# Patient Record
Sex: Female | Born: 1945 | Race: White | Hispanic: No | Marital: Married | Smoking: Never smoker
Health system: Southern US, Community
[De-identification: ages and names within clinical notes are randomized; demographics above are authoritative.]

## PROBLEM LIST (undated history)

## (undated) DIAGNOSIS — R748 Abnormal levels of other serum enzymes: Secondary | ICD-10-CM

## (undated) DIAGNOSIS — Z5189 Encounter for other specified aftercare: Secondary | ICD-10-CM

## (undated) DIAGNOSIS — Z87442 Personal history of urinary calculi: Secondary | ICD-10-CM

## (undated) DIAGNOSIS — E785 Hyperlipidemia, unspecified: Secondary | ICD-10-CM

## (undated) DIAGNOSIS — E559 Vitamin D deficiency, unspecified: Secondary | ICD-10-CM

## (undated) DIAGNOSIS — I779 Disorder of arteries and arterioles, unspecified: Secondary | ICD-10-CM

## (undated) DIAGNOSIS — K429 Umbilical hernia without obstruction or gangrene: Secondary | ICD-10-CM

## (undated) DIAGNOSIS — H269 Unspecified cataract: Secondary | ICD-10-CM

## (undated) DIAGNOSIS — Z8759 Personal history of other complications of pregnancy, childbirth and the puerperium: Secondary | ICD-10-CM

## (undated) DIAGNOSIS — I482 Chronic atrial fibrillation, unspecified: Secondary | ICD-10-CM

## (undated) DIAGNOSIS — Z90710 Acquired absence of both cervix and uterus: Secondary | ICD-10-CM

## (undated) DIAGNOSIS — G4733 Obstructive sleep apnea (adult) (pediatric): Secondary | ICD-10-CM

## (undated) DIAGNOSIS — D649 Anemia, unspecified: Secondary | ICD-10-CM

## (undated) DIAGNOSIS — E739 Lactose intolerance, unspecified: Secondary | ICD-10-CM

## (undated) DIAGNOSIS — M7122 Synovial cyst of popliteal space [Baker], left knee: Secondary | ICD-10-CM

## (undated) DIAGNOSIS — B351 Tinea unguium: Secondary | ICD-10-CM

## (undated) DIAGNOSIS — M859 Disorder of bone density and structure, unspecified: Secondary | ICD-10-CM

## (undated) DIAGNOSIS — S42309A Unspecified fracture of shaft of humerus, unspecified arm, initial encounter for closed fracture: Secondary | ICD-10-CM

## (undated) DIAGNOSIS — N6002 Solitary cyst of left breast: Secondary | ICD-10-CM

## (undated) DIAGNOSIS — Z8781 Personal history of (healed) traumatic fracture: Secondary | ICD-10-CM

## (undated) DIAGNOSIS — G4730 Sleep apnea, unspecified: Secondary | ICD-10-CM

## (undated) DIAGNOSIS — H9193 Unspecified hearing loss, bilateral: Secondary | ICD-10-CM

## (undated) DIAGNOSIS — T7840XA Allergy, unspecified, initial encounter: Secondary | ICD-10-CM

## (undated) DIAGNOSIS — I679 Cerebrovascular disease, unspecified: Secondary | ICD-10-CM

## (undated) DIAGNOSIS — L570 Actinic keratosis: Secondary | ICD-10-CM

## (undated) DIAGNOSIS — J439 Emphysema, unspecified: Secondary | ICD-10-CM

## (undated) DIAGNOSIS — M7660 Achilles tendinitis, unspecified leg: Secondary | ICD-10-CM

## (undated) DIAGNOSIS — M1990 Unspecified osteoarthritis, unspecified site: Secondary | ICD-10-CM

## (undated) DIAGNOSIS — I10 Essential (primary) hypertension: Secondary | ICD-10-CM

## (undated) DIAGNOSIS — I739 Peripheral vascular disease, unspecified: Secondary | ICD-10-CM

## (undated) DIAGNOSIS — M8580 Other specified disorders of bone density and structure, unspecified site: Secondary | ICD-10-CM

## (undated) DIAGNOSIS — T466X5A Adverse effect of antihyperlipidemic and antiarteriosclerotic drugs, initial encounter: Secondary | ICD-10-CM

## (undated) DIAGNOSIS — J45909 Unspecified asthma, uncomplicated: Secondary | ICD-10-CM

## (undated) DIAGNOSIS — I4891 Unspecified atrial fibrillation: Secondary | ICD-10-CM

## (undated) DIAGNOSIS — H903 Sensorineural hearing loss, bilateral: Secondary | ICD-10-CM

## (undated) DIAGNOSIS — M791 Myalgia, unspecified site: Secondary | ICD-10-CM

## (undated) DIAGNOSIS — Z87828 Personal history of other (healed) physical injury and trauma: Secondary | ICD-10-CM

## (undated) DIAGNOSIS — E042 Nontoxic multinodular goiter: Secondary | ICD-10-CM

## (undated) DIAGNOSIS — N200 Calculus of kidney: Secondary | ICD-10-CM

## (undated) DIAGNOSIS — N281 Cyst of kidney, acquired: Secondary | ICD-10-CM

## (undated) HISTORY — DX: Umbilical hernia without obstruction or gangrene: K42.9

## (undated) HISTORY — DX: Achilles tendinitis, unspecified leg: M76.60

## (undated) HISTORY — DX: Synovial cyst of popliteal space (Baker), left knee: M71.22

## (undated) HISTORY — DX: Unspecified cataract: H26.9

## (undated) HISTORY — DX: Calculus of kidney: N20.0

## (undated) HISTORY — DX: Personal history of other (healed) physical injury and trauma: Z87.828

## (undated) HISTORY — DX: Personal history of (healed) traumatic fracture: Z87.81

## (undated) HISTORY — DX: Personal history of urinary calculi: Z87.442

## (undated) HISTORY — DX: Unspecified atrial fibrillation: I48.91

## (undated) HISTORY — DX: Adverse effect of antihyperlipidemic and antiarteriosclerotic drugs, initial encounter: T46.6X5A

## (undated) HISTORY — DX: Chronic atrial fibrillation, unspecified: I48.20

## (undated) HISTORY — DX: Solitary cyst of left breast: N60.02

## (undated) HISTORY — PX: BREAST CYST ASPIRATION: SHX578

## (undated) HISTORY — DX: Personal history of other complications of pregnancy, childbirth and the puerperium: Z87.442

## (undated) HISTORY — DX: Allergy, unspecified, initial encounter: T78.40XA

## (undated) HISTORY — DX: Other specified disorders of bone density and structure, unspecified site: M85.80

## (undated) HISTORY — DX: Essential (primary) hypertension: I10

## (undated) HISTORY — DX: Sensorineural hearing loss, bilateral: H90.3

## (undated) HISTORY — DX: Hyperlipidemia, unspecified: E78.5

## (undated) HISTORY — DX: Disorder of arteries and arterioles, unspecified: I77.9

## (undated) HISTORY — DX: Actinic keratosis: L57.0

## (undated) HISTORY — DX: Myalgia, unspecified site: M79.10

## (undated) HISTORY — DX: Personal history of other complications of pregnancy, childbirth and the puerperium: Z87.59

## (undated) HISTORY — DX: Tinea unguium: B35.1

## (undated) HISTORY — PX: MOUTH SURGERY: SHX715

## (undated) HISTORY — DX: Lactose intolerance, unspecified: E73.9

## (undated) HISTORY — DX: Hypercalcemia: E83.52

## (undated) HISTORY — DX: Anemia, unspecified: D64.9

## (undated) HISTORY — DX: Vitamin D deficiency, unspecified: E55.9

## (undated) HISTORY — DX: Obstructive sleep apnea (adult) (pediatric): G47.33

## (undated) HISTORY — DX: Sleep apnea, unspecified: G47.30

## (undated) HISTORY — DX: Emphysema, unspecified: J43.9

## (undated) HISTORY — DX: Unspecified osteoarthritis, unspecified site: M19.90

## (undated) HISTORY — DX: Unspecified fracture of shaft of humerus, unspecified arm, initial encounter for closed fracture: S42.309A

## (undated) HISTORY — DX: Acquired absence of both cervix and uterus: Z90.710

## (undated) HISTORY — DX: Unspecified asthma, uncomplicated: J45.909

## (undated) HISTORY — DX: Unspecified hearing loss, bilateral: H91.93

## (undated) HISTORY — DX: Disorder of bone density and structure, unspecified: M85.9

## (undated) HISTORY — DX: Encounter for other specified aftercare: Z51.89

## (undated) HISTORY — DX: Peripheral vascular disease, unspecified: I73.9

## (undated) HISTORY — DX: Abnormal levels of other serum enzymes: R74.8

## (undated) HISTORY — DX: Cerebrovascular disease, unspecified: I67.9

## (undated) HISTORY — DX: Nontoxic multinodular goiter: E04.2

## (undated) HISTORY — DX: Cyst of kidney, acquired: N28.1

---
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
## 1987-01-12 HISTORY — PX: ABDOMINAL HYSTERECTOMY: SHX81

## 1994-01-11 HISTORY — PX: ABLATION: SHX5711

## 2000-01-12 HISTORY — PX: KNEE ARTHROSCOPY W/ MENISCAL REPAIR: SHX1877

## 2008-01-12 LAB — HM COLONOSCOPY

## 2009-12-30 LAB — HM DEXA SCAN: HM Dexa Scan: -1.4

## 2012-10-11 DIAGNOSIS — N281 Cyst of kidney, acquired: Secondary | ICD-10-CM

## 2012-10-11 HISTORY — DX: Cyst of kidney, acquired: N28.1

## 2013-01-11 HISTORY — PX: REPLACEMENT TOTAL KNEE: SUR1224

## 2014-05-24 LAB — HM DEXA SCAN

## 2016-11-30 DIAGNOSIS — I10 Essential (primary) hypertension: Secondary | ICD-10-CM | POA: Insufficient documentation

## 2016-11-30 DIAGNOSIS — G4733 Obstructive sleep apnea (adult) (pediatric): Secondary | ICD-10-CM | POA: Insufficient documentation

## 2016-11-30 DIAGNOSIS — I482 Chronic atrial fibrillation, unspecified: Secondary | ICD-10-CM | POA: Insufficient documentation

## 2016-11-30 DIAGNOSIS — E785 Hyperlipidemia, unspecified: Secondary | ICD-10-CM | POA: Insufficient documentation

## 2017-01-11 HISTORY — PX: COLONOSCOPY: SHX174

## 2017-01-21 DIAGNOSIS — E042 Nontoxic multinodular goiter: Secondary | ICD-10-CM | POA: Insufficient documentation

## 2017-01-21 DIAGNOSIS — M179 Osteoarthritis of knee, unspecified: Secondary | ICD-10-CM | POA: Insufficient documentation

## 2017-01-21 DIAGNOSIS — R7301 Impaired fasting glucose: Secondary | ICD-10-CM | POA: Insufficient documentation

## 2017-01-21 DIAGNOSIS — Z90710 Acquired absence of both cervix and uterus: Secondary | ICD-10-CM | POA: Insufficient documentation

## 2017-01-21 DIAGNOSIS — L570 Actinic keratosis: Secondary | ICD-10-CM | POA: Insufficient documentation

## 2017-01-21 DIAGNOSIS — E559 Vitamin D deficiency, unspecified: Secondary | ICD-10-CM | POA: Insufficient documentation

## 2017-01-21 DIAGNOSIS — E739 Lactose intolerance, unspecified: Secondary | ICD-10-CM | POA: Insufficient documentation

## 2017-01-21 DIAGNOSIS — D649 Anemia, unspecified: Secondary | ICD-10-CM | POA: Insufficient documentation

## 2017-01-21 DIAGNOSIS — K429 Umbilical hernia without obstruction or gangrene: Secondary | ICD-10-CM | POA: Insufficient documentation

## 2017-01-21 DIAGNOSIS — M1710 Unilateral primary osteoarthritis, unspecified knee: Secondary | ICD-10-CM | POA: Insufficient documentation

## 2017-01-21 HISTORY — DX: Nontoxic multinodular goiter: E04.2

## 2017-01-28 DIAGNOSIS — H903 Sensorineural hearing loss, bilateral: Secondary | ICD-10-CM

## 2017-01-28 HISTORY — DX: Sensorineural hearing loss, bilateral: H90.3

## 2017-01-28 LAB — BASIC METABOLIC PANEL
BUN: 16 (ref 4–21)
Creatinine: 0.8 (ref 0.5–1.1)
Glucose: 101
Potassium: 4.6 (ref 3.4–5.3)
Sodium: 143 (ref 137–147)

## 2017-01-28 LAB — LIPID PANEL
Cholesterol: 211 — AB (ref 0–200)
HDL: 97 — AB (ref 35–70)
LDL Cholesterol: 99
Triglycerides: 76 (ref 40–160)

## 2017-01-28 LAB — CBC AND DIFFERENTIAL
HCT: 41 (ref 36–46)
Hemoglobin: 13.2 (ref 12.0–16.0)
Platelets: 214 (ref 150–399)
WBC: 4.1

## 2017-01-28 LAB — TSH: TSH: 1.7 (ref 0.41–5.90)

## 2017-01-28 LAB — VITAMIN D 25 HYDROXY (VIT D DEFICIENCY, FRACTURES): Vit D, 25-Hydroxy: 38.9

## 2017-01-28 LAB — HEMOGLOBIN A1C: Hemoglobin A1C: 5.5

## 2017-02-05 LAB — HM MAMMOGRAPHY

## 2017-02-09 LAB — COLOGUARD: Cologuard: NEGATIVE

## 2017-02-22 DIAGNOSIS — M8580 Other specified disorders of bone density and structure, unspecified site: Secondary | ICD-10-CM | POA: Insufficient documentation

## 2017-02-22 LAB — HM DEXA SCAN

## 2017-06-17 LAB — BASIC METABOLIC PANEL
BUN: 14 (ref 4–21)
Creatinine: 0.9 (ref 0.5–1.1)
Potassium: 4.5 (ref 3.4–5.3)
Sodium: 143 (ref 137–147)

## 2017-06-17 LAB — HEPATIC FUNCTION PANEL
ALT: 7 (ref 7–35)
AST: 14 (ref 13–35)
Alkaline Phosphatase: 80 (ref 25–125)
Bilirubin, Total: 0.4

## 2017-10-28 DIAGNOSIS — Z23 Encounter for immunization: Secondary | ICD-10-CM | POA: Diagnosis not present

## 2017-12-07 ENCOUNTER — Encounter: Payer: Self-pay | Admitting: Nurse Practitioner

## 2017-12-07 ENCOUNTER — Ambulatory Visit (INDEPENDENT_AMBULATORY_CARE_PROVIDER_SITE_OTHER): Payer: Medicare Other | Admitting: Nurse Practitioner

## 2017-12-07 VITALS — BP 142/84 | HR 90 | Temp 98.1°F | Ht 62.0 in | Wt 194.0 lb

## 2017-12-07 DIAGNOSIS — M8949 Other hypertrophic osteoarthropathy, multiple sites: Secondary | ICD-10-CM

## 2017-12-07 DIAGNOSIS — M8580 Other specified disorders of bone density and structure, unspecified site: Secondary | ICD-10-CM

## 2017-12-07 DIAGNOSIS — E042 Nontoxic multinodular goiter: Secondary | ICD-10-CM

## 2017-12-07 DIAGNOSIS — M150 Primary generalized (osteo)arthritis: Secondary | ICD-10-CM | POA: Diagnosis not present

## 2017-12-07 DIAGNOSIS — L600 Ingrowing nail: Secondary | ICD-10-CM | POA: Diagnosis not present

## 2017-12-07 DIAGNOSIS — I10 Essential (primary) hypertension: Secondary | ICD-10-CM | POA: Diagnosis not present

## 2017-12-07 DIAGNOSIS — I482 Chronic atrial fibrillation, unspecified: Secondary | ICD-10-CM

## 2017-12-07 DIAGNOSIS — M159 Polyosteoarthritis, unspecified: Secondary | ICD-10-CM

## 2017-12-07 DIAGNOSIS — B351 Tinea unguium: Secondary | ICD-10-CM | POA: Diagnosis not present

## 2017-12-07 DIAGNOSIS — E785 Hyperlipidemia, unspecified: Secondary | ICD-10-CM | POA: Diagnosis not present

## 2017-12-07 MED ORDER — LISINOPRIL 20 MG PO TABS: 20.0000 mg | ORAL_TABLET | Freq: Every day | ORAL | 1 refills | Status: DC

## 2017-12-07 NOTE — Progress Notes (Signed)
Careteam: Patient Care Team: Barnetta Chapel, MD as PCP - General (Internal Medicine)  Advanced Directive information Does Patient Have a Medical Advance Directive?: No  Allergies  Allergen Reactions  . Coq10 [Coenzyme Q10] Other (See Comments)  . Crestor [Rosuvastatin Calcium] Other (See Comments)    Leg cramps  . Lactose Intolerance (Gi)   . Latex   . Naprosyn [Naproxen]   . Zocor [Simvastatin]     Chief Complaint  Patient presents with  . Establish Care    Pt is being seen to establish care. Previous PCP was in [place].      HPI: Patient is a 72 y.o. female seen in the office today to establish care.   Last physical was in January. 01/24/17  Chronic a fib- taking digoxin 0.125 mg by mouth daily, taking metoprolol succinate 50 mg by mouth twice daily for rate and xarelto 20 mg daily for anticoagulation- due for cardiologist visit. 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 After the ablation was diagnosised with a fib.   Osteopenia- taking cal with Vit D, had bone density last year.  No exercise or weight bearing activity.   Achilles tendonitis/OA- taking tylenol routinely twice daily, OA in ankle, right knee, left shoulder.   Hyperlipidemia- taking crestor 5 mg three times a week because it gives her cramps- tolerates 3 times a week much better than daily Attempts diet modifications.   Lactose intolerance- using probiotic routinely and this has helped significantly with fibercon.  On occuvite due to strong family hx of eye issues, no problems at this time other than severely nearsighted   Nose bleeds- happened when she was moving so started oxymetazoline which has been helping. With allergies, reacted to dust and grass when tested.   Hypertension- taking metoprolol succinate 50 mg BID and lisinopril. - has not taken medication today   Complete right knee replacement in 2015- taking antibiotic prior to dental  work  Non-toxic multinodular goiter- has been going to an endocrinologist who has been monitoring this- was going yearly then every 2 years.   Hysterectomy due to possible ovarian cancer they only removed 1 ovary and there was no cancer detected.   Review of Systems:  Review of Systems  Constitutional: Negative for chills, fever and weight loss.  HENT: Positive for hearing loss. Negative for tinnitus.   Respiratory: Negative for cough, sputum production and shortness of breath.   Cardiovascular: Positive for palpitations. Negative for chest pain and leg swelling.  Gastrointestinal: Negative for abdominal pain, constipation, diarrhea and heartburn.  Genitourinary: Negative for dysuria, frequency and urgency.  Musculoskeletal: Positive for joint pain and myalgias. Negative for back pain and falls.  Skin: Negative.   Neurological: Negative for dizziness and headaches.  Endo/Heme/Allergies: Positive for environmental allergies.  Psychiatric/Behavioral: Negative for depression and memory loss. The patient does not have insomnia.     Past Medical History:  Diagnosis Date  . Achilles tendinitis    Per [facility] New Patient packet   . Cerebrovascular disease    Per [facility] New Patient packet   . Chronic atrial fibrillation    Per [facility] New Patient packet   . Chronic atrial fibrillation    [name], MD  . Disorder of carotid artery [facility])    Lynden Ang, MD  . Dyslipidemia    [name], MD  . H/O calculus of kidney during pregnancy    Lynden Ang, MD  . H/O hysterectomy for benign disease  Lynden Ang, MD  . Hypertension    Per [facility] New Patient packet   . Lactose intolerance    Per [facility] New Patient packet   . Multiple actinic keratoses    Lynden Ang, MD  . 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 Normocytic anemia    Lynden Ang, MD  . Obstructive sleep apnea syndrome    [name], MD  . Osteoarthritis    Per [facility] New Patient packet   .  Osteopenia    Per [facility] New Patient packet   . Renal cyst    Lynden Ang, MD  . 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 Umbilical hernia    Per [facility] New Patient packet   . Vitamin D deficiency    Lynden Ang, MD   Past Surgical History:  Procedure Laterality Date  . ABDOMINAL HYSTERECTOMY  1989   One ovary, Per [facility] New Patient packet   . ABLATION  1996   Radical, Per [facility] New Patient packet   . COLONOSCOPY  2019   Dr.Melissa Fayrene Fearing, Per [facility] New Patient packet   . KNEE ARTHROSCOPY W/ MENISCAL REPAIR  2002   Per [facility] New Patient packet   . REPLACEMENT TOTAL KNEE Left 2015   Per [facility] New Patient packet  . TONSILLECTOMY  1951   Per [facility] New Patient packet    Social History:   reports that she has never smoked. She has never used smokeless tobacco. She reports that she drinks alcohol. She reports that she does not use drugs.  Family History  Problem Relation Age of Onset  . Congestive Heart Failure Mother   . Diabetes Mother   . Arthritis Mother   . Stroke Father   . Dementia Father   . Diabetes Father   . Diabetes Sister   . Cancer Maternal Grandfather   . Congestive Heart Failure Daughter   . Heart attack Maternal Aunt     Medications: Patient's Medications  New Prescriptions   No medications on file  Previous Medications   ACETAMINOPHEN (TYLENOL) 650 MG CR TABLET    Take 1,300 mg by mouth every 8 (eight) hours as needed for pain.   CALCIUM CARBONATE (OSCAL) 1500 (600 CA) MG TABS TABLET    Take 600 mg of elemental calcium by mouth 2 (two) times daily with a meal.   CHOLECALCIFEROL (VITAMIN D3 PO)    Take 1,000 Units by mouth daily.    DIGOXIN (LANOXIN) 0.125 MG TABLET    Take 0.125 mg by mouth daily.   LISINOPRIL (PRINIVIL,ZESTRIL) 20 MG TABLET    Take 20 mg by mouth daily.   METOPROLOL SUCCINATE 50 MG CS24    Take by mouth 2 (two) times daily.   MULTIPLE VITAMINS-MINERALS (OCUVITE PO)    Take by mouth daily.   OXYMETAZOLINE HCL  (SINEX ULTRA FINE MIST 12-HOUR NA)    Place 1 spray into the nose 2 (two) times daily.   POLYCARBOPHIL (FIBERCON) 625 MG TABLET    Take 1,250 mg by mouth daily.   PROBIOTIC PRODUCT (PHILLIPS COLON HEALTH PO)    Take by mouth daily.   RIVAROXABAN (XARELTO) 20 MG TABS TABLET    Take 20 mg by mouth daily with supper.   ROSUVASTATIN (CRESTOR) 5 MG TABLET    Take 5 mg by mouth 3 (three) times a week.  Modified Medications   No medications on file  Discontinued Medications   ACETAMINOPHEN (TYLENOL)  500 MG TABLET    1-2 pills 2 times daily     Physical Exam:  Vitals:   12/07/17 0848  BP: (!) 142/84  Pulse: 90  Temp: 98.1 F (36.7 C)  TempSrc: Oral  SpO2: 97%  Weight: 194 lb (88 kg)  Height: 5\' 2"  (1.575 m)   Body mass index is 35.48 kg/m.  Physical Exam  Constitutional: She is oriented to person, place, and time. She appears well-developed and well-nourished. No distress.  HENT:  Head: Normocephalic and atraumatic.  Mouth/Throat: Oropharynx is clear and moist. No oropharyngeal exudate.  Eyes: Pupils are equal, round, and reactive to light. Conjunctivae are normal.  Neck: Normal range of motion. Neck supple.  Cardiovascular: Normal rate and normal heart sounds. An irregularly irregular rhythm present.  Pulmonary/Chest: Effort normal and breath sounds normal.  Abdominal: Soft. Bowel sounds are normal.  Musculoskeletal: She exhibits no edema or tenderness.  Neurological: She is alert and oriented to person, place, and time.  Skin: Skin is warm and dry. She is not diaphoretic.  Fungus noted to toenails. Tenderness noted to medial right great toe,without signs of acute infection  Psychiatric: She has a normal mood and affect.    Labs reviewed: Basic Metabolic Panel: Recent Labs    06/17/17  NA 143  K 4.5  BUN 14  CREATININE 0.9   Liver Function Tests: Recent Labs    06/17/17  AST 14  ALT 7  ALKPHOS 80   No results for input(s): LIPASE, AMYLASE in the last 8760  hours. No results for input(s): AMMONIA in the last 8760 hours. CBC: No results for input(s): WBC, NEUTROABS, HGB, HCT, MCV, PLT in the last 8760 hours. Lipid Panel: No results for input(s): CHOL, HDL, LDLCALC, TRIG, CHOLHDL, LDLDIRECT in the last 8760 hours. TSH: No results for input(s): TSH in the last 8760 hours. A1C: No results found for: HGBA1C   Assessment/Plan 1. Ingrown toenail To use epsom salt soaks ~ 20 mins TID - Ambulatory referral to Podiatry if needed  2. Osteopenia, unspecified location Dexa scan last year per pt, continues on cal with D, encouraged weight bearing activity.  3. Primary osteoarthritis involving multiple joints Stable on tylenol twice daily  4. Essential hypertension Elevated today but did not take her medication. Continue dietary modification with current medications, to take before next OV - lisinopril (PRINIVIL,ZESTRIL) 20 MG tablet; Take 1 tablet (20 mg total) by mouth daily.  Dispense: 90 tablet; Refill: 1  5. Dyslipidemia -continues on crestor 5 mg three times a week with diet modifications.  - COMPLETE METABOLIC PANEL WITH GFR - Lipid Panel  6. Chronic atrial fibrillation Rate controlled, continues on digoxin with metoprolol for rate and xarelto for anticoagulation.  - Ambulatory referral to Cardiology - CBC with Differential/Platelets - Digoxin level  7. Non-toxic multinodular goiter -had previously been following with endocrinology, we will get these records.  - TSH  8. Tinea unguium  Getting benefit from Vicks vapor rub, can continue this or use medicated toenail polish.   Next appt: 2 months for AWV and EV Deetta Siegmann K. Biagio Borg  [facility] [phone]

## 2017-12-07 NOTE — Patient Instructions (Addendum)
Fasting labs today  To follow up in 2 months for AWV and EV   For toenail fungus- Can continue to use vicks  Or use medicated nail polish that you can get at the pharmacy  To use warm water epsom salt soaks ~20 min 2-3 times a day until resolves

## 2017-12-08 LAB — COMPLETE METABOLIC PANEL WITH GFR
AG Ratio: 1.6 (calc) (ref 1.0–2.5)
ALT: 8 U/L (ref 6–29)
AST: 17 U/L (ref 10–35)
Albumin: 4.2 g/dL (ref 3.6–5.1)
Alkaline phosphatase (APISO): 69 U/L (ref 33–130)
BUN: 14 mg/dL (ref 7–25)
CO2: 28 mmol/L (ref 20–32)
Calcium: 9.6 mg/dL (ref 8.6–10.4)
Chloride: 103 mmol/L (ref 98–110)
Creat: 0.91 mg/dL (ref 0.60–0.93)
GFR, Est African American: 74 mL/min/{1.73_m2} (ref >=60)
GFR, Est Non African American: 64 mL/min/{1.73_m2} (ref >=60)
Globulin: 2.6 g/dL (calc) (ref 1.9–3.7)
Glucose, Bld: 95 mg/dL (ref 65–99)
Potassium: 4.3 mmol/L (ref 3.5–5.3)
Sodium: 140 mmol/L (ref 135–146)
Total Bilirubin: 0.6 mg/dL (ref 0.2–1.2)
Total Protein: 6.8 g/dL (ref 6.1–8.1)

## 2017-12-08 LAB — TSH: TSH: 1.74 mIU/L (ref 0.40–4.50)

## 2017-12-08 LAB — CBC WITH DIFFERENTIAL/PLATELET
Basophils Absolute: 29 cells/uL (ref 0–200)
Basophils Relative: 0.7 %
Eosinophils Absolute: 42 cells/uL (ref 15–500)
Eosinophils Relative: 1 %
HCT: 39.1 % (ref 35.0–45.0)
Hemoglobin: 12.8 g/dL (ref 11.7–15.5)
Lymphs Abs: 1428 cells/uL (ref 850–3900)
MCH: 30.9 pg (ref 27.0–33.0)
MCHC: 32.7 g/dL (ref 32.0–36.0)
MCV: 94.4 fL (ref 80.0–100.0)
MPV: 12.6 fL — ABNORMAL HIGH (ref 7.5–12.5)
Monocytes Relative: 10.2 %
Neutro Abs: 2272 cells/uL (ref 1500–7800)
Neutrophils Relative %: 54.1 %
Platelets: 181 10*3/uL (ref 140–400)
RBC: 4.14 10*6/uL (ref 3.80–5.10)
RDW: 12.4 % (ref 11.0–15.0)
Total Lymphocyte: 34 %
WBC mixed population: 428 cells/uL (ref 200–950)
WBC: 4.2 10*3/uL (ref 3.8–10.8)

## 2017-12-08 LAB — LIPID PANEL
Cholesterol: 200 mg/dL — ABNORMAL HIGH (ref <200)
HDL: 92 mg/dL (ref >50)
LDL Cholesterol (Calc): 90 mg/dL (calc)
Non-HDL Cholesterol (Calc): 108 mg/dL (calc) (ref <130)
Total CHOL/HDL Ratio: 2.2 (calc) (ref <5.0)
Triglycerides: 90 mg/dL (ref <150)

## 2017-12-08 LAB — DIGOXIN LEVEL: Digoxin Level: 0.6 mcg/L — ABNORMAL LOW (ref 0.8–2.0)

## 2017-12-12 ENCOUNTER — Other Ambulatory Visit: Payer: Self-pay

## 2017-12-12 MED ORDER — RIVAROXABAN 20 MG PO TABS: 20.0000 mg | ORAL_TABLET | Freq: Every day | ORAL | 0 refills | Status: DC

## 2017-12-12 NOTE — Telephone Encounter (Signed)
Last OV 12/07/2017 Historical medication Ok to refill?

## 2018-01-08 ENCOUNTER — Other Ambulatory Visit: Payer: Self-pay | Admitting: Nurse Practitioner

## 2018-01-16 ENCOUNTER — Ambulatory Visit (INDEPENDENT_AMBULATORY_CARE_PROVIDER_SITE_OTHER): Payer: Medicare Other

## 2018-01-16 ENCOUNTER — Encounter: Payer: Self-pay | Admitting: Podiatry

## 2018-01-16 ENCOUNTER — Ambulatory Visit (INDEPENDENT_AMBULATORY_CARE_PROVIDER_SITE_OTHER): Payer: Medicare Other | Admitting: Podiatry

## 2018-01-16 ENCOUNTER — Other Ambulatory Visit: Payer: Self-pay | Admitting: Podiatry

## 2018-01-16 VITALS — BP 114/75 | HR 74

## 2018-01-16 DIAGNOSIS — M79672 Pain in left foot: Secondary | ICD-10-CM | POA: Diagnosis not present

## 2018-01-16 DIAGNOSIS — M79671 Pain in right foot: Secondary | ICD-10-CM

## 2018-01-16 DIAGNOSIS — M76821 Posterior tibial tendinitis, right leg: Secondary | ICD-10-CM

## 2018-01-16 DIAGNOSIS — L600 Ingrowing nail: Secondary | ICD-10-CM

## 2018-01-16 DIAGNOSIS — M76822 Posterior tibial tendinitis, left leg: Secondary | ICD-10-CM | POA: Diagnosis not present

## 2018-01-16 MED ORDER — NEOMYCIN-POLYMYXIN-HC 3.5-10000-1 OT SOLN: OTIC | 0 refills | Status: DC

## 2018-01-16 MED ORDER — TRIAMCINOLONE ACETONIDE 10 MG/ML IJ SUSP
10.0000 mg | Freq: Once | INTRAMUSCULAR | Status: AC
  Administered 2018-01-16: 10 mg

## 2018-01-16 NOTE — Patient Instructions (Signed)

## 2018-01-17 ENCOUNTER — Encounter: Payer: Self-pay | Admitting: Cardiology

## 2018-01-17 ENCOUNTER — Ambulatory Visit (INDEPENDENT_AMBULATORY_CARE_PROVIDER_SITE_OTHER): Payer: Medicare Other | Admitting: Cardiology

## 2018-01-17 VITALS — BP 132/90 | HR 85 | Ht 62.0 in | Wt 193.0 lb

## 2018-01-17 DIAGNOSIS — R002 Palpitations: Secondary | ICD-10-CM | POA: Diagnosis not present

## 2018-01-17 DIAGNOSIS — I10 Essential (primary) hypertension: Secondary | ICD-10-CM | POA: Diagnosis not present

## 2018-01-17 DIAGNOSIS — E7800 Pure hypercholesterolemia, unspecified: Secondary | ICD-10-CM | POA: Diagnosis not present

## 2018-01-17 DIAGNOSIS — I482 Chronic atrial fibrillation, unspecified: Secondary | ICD-10-CM

## 2018-01-17 MED ORDER — METOPROLOL SUCCINATE 50 MG PO CS24: 150.0000 mg | EXTENDED_RELEASE_CAPSULE | Freq: Every day | ORAL | 3 refills | Status: DC

## 2018-01-17 NOTE — Progress Notes (Signed)
Cardiology Office Note:    Date:  01/17/2018   ID:  Jennifer Mcclain, DOB [birth year], MRN [id number]  PCP:  [name], NP  Cardiologist:  [name], MD PhD  Referring MD: [name], NP   CC: establish care for atrial fibrillation  History of Present Illness:    Jennifer Mcclain is a 73 y.o. female with a hx of atrial fibrillation, hyperlipidemia, hypertension who is seen as a new consult at the request of [name], NP for the evaluation and management of atrial fibrillation.  Was diagnosed with WPW in [place]. Had an ablation done, but was told part of it was too close to the His bundle and so the EP cardiologist ablated all around it but not in the bundle. Moved to [place] about 10 years ago, was told there was no evidence of WPW. She had an episode of afib about 5 years ago that was fast, but has otherwise been well controlled. She can feel when she has atrial fibrillation, usually a few times/day. Doesn't stop her from doing her daily activities. Doesn't think it lasts long. No syncope. No chest pain. Does have some shortness of breath.   Daughter passed away from heart disease; had a kink in one of her arteries, died at age 38.  Past Medical History:  Diagnosis Date  . Achilles tendinitis    Per [facility] New Patient packet   . Cerebrovascular disease    Per [facility] New Patient packet   . Chronic atrial fibrillation    Per [facility] New Patient packet   . Chronic atrial fibrillation    [name], MD  . Disorder of carotid artery [facility])    Lynden Ang, MD  . Dyslipidemia    [name], MD  . H/O calculus of kidney during pregnancy    Lynden Ang, MD  . H/O hysterectomy for benign disease    Lynden Ang, MD  . Humerus fracture    left leg- due to fall  . Hypertension    Per [facility] New Patient packet   . Lactose intolerance    Per [facility] New Patient packet   . Multiple actinic keratoses    Lynden Ang, MD  . Non-toxic multinodular  goiter 01/21/2017   Lynden Ang, MD  . Normocytic anemia    Lynden Ang, MD  . Obstructive sleep apnea syndrome    [name], MD  . Osteoarthritis    Per [facility] New Patient packet   . Osteopenia    Per [facility] New Patient packet   . Renal cyst 10/11/2012   Lynden Ang, MD  . 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 Umbilical hernia    Per [facility] New Patient packet   . Vitamin D deficiency    Lynden Ang, MD    Past Surgical History:  Procedure Laterality Date  . ABDOMINAL HYSTERECTOMY  1989   One ovary, Per [facility] New Patient packet   . ABLATION  1996   Radical, Per [facility] New Patient packet   . COLONOSCOPY  2019   Dr.Melissa Fayrene Fearing, Per [facility] New Patient packet   . KNEE ARTHROSCOPY W/ MENISCAL REPAIR  2002   Per [facility] New Patient packet   . REPLACEMENT TOTAL KNEE Left 2015   Per [facility] New Patient packet  . TONSILLECTOMY  1951   Per [facility] New Patient packet     Current Medications: Current Outpatient Medications on File Prior to Visit  Medication Sig  .  acetaminophen (TYLENOL) 650 MG CR tablet Take 1,300 mg by mouth every 8 (eight) hours as needed for pain.  . calcium carbonate (OSCAL) 1500 (600 Ca) MG TABS tablet Take 600 mg of elemental calcium by mouth 2 (two) times daily with a meal.  . Cholecalciferol (VITAMIN D3 PO) Take 1,000 Units by mouth daily.   Marland Kitchen. lisinopril (PRINIVIL,ZESTRIL) 20 MG tablet Take 1 tablet (20 mg total) by mouth daily.  . Multiple Vitamins-Minerals (OCUVITE PO) Take by mouth daily.  Marland Kitchen. neomycin-polymyxin-hydrocortisone (CORTISPORIN) OTIC solution Apply 1-2 drops to toe after soaking twice a day  . Oxymetazoline HCl (SINEX ULTRA FINE MIST 12-HOUR NA) Place 1 spray into the nose 2 (two) times daily.  . polycarbophil (FIBERCON) 625 MG tablet Take 1,250 mg by mouth daily.  . Probiotic Product (PHILLIPS COLON HEALTH PO) Take by mouth daily.  . rosuvastatin (CRESTOR) 5 MG tablet Take 5 mg by mouth 3 (three) times a week.  Carlena Hurl.  XARELTO 20 MG TABS tablet TAKE ONE TABLET BY MOUTH DAILY WITH SUPPER   No current facility-administered medications on file prior to visit.      Allergies:   Lactose intolerance (gi); Naprosyn [naproxen]; Coq10 [coenzyme q10]; Latex; and Zocor [simvastatin]   Social History   Socioeconomic History  . Marital status: Married    Spouse name: Not on file  . Number of children: Not on file  . Years of education: Not on file  . Highest education level: Not on file  Occupational History  . Not on file  Social Needs  . Financial resource strain: Not on file  . Food insecurity:    Worry: Not on file    Inability: Not on file  . Transportation needs:    Medical: Not on file    Non-medical: Not on file  Tobacco Use  . Smoking status: Never Smoker  . Smokeless tobacco: Never Used  Substance and Sexual Activity  . Alcohol use: Yes    Comment: 1-2 drinks weekly - social   . Drug use: Never  . Sexual activity: Not on file  Lifestyle  . Physical activity:    Days per week: Not on file    Minutes per session: Not on file  . Stress: Not on file  Relationships  . Social connections:    Talks on phone: Not on file    Gets together: Not on file    Attends religious service: Not on file    Active member of club or organization: Not on file    Attends meetings of clubs or organizations: Not on file    Relationship status: Not on file  Other Topics Concern  . 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 No  Do you have difficulty preparing food or eating? No   Do you have difficulty managing your medications? No   Do you have difficulty managing your finances? No   Do you have difficulty affording your medications? No     Family History: The patient's family history includes AAA (abdominal aortic aneurysm) in her sister; Alzheimer's disease (age of onset: 61) in her father; Arthritis (age of onset: 34) in her mother; Breast cancer in her paternal aunt; CVA in her maternal grandfather; Cancer in her maternal grandfather; Congenital heart disease in her daughter; Congestive Heart Failure in her mother; Dementia in her father; Diabetes in her father and sister; Diabetes (age of onset: 98) in her mother; Diabetes (age of onset: 16) in her maternal grandfather; Heart attack in her maternal aunt; Hyperlipidemia in her father; Lung cancer (age of onset: 33) in her maternal grandmother; Stroke in her father.  ROS:   Please see the history of present illness.  Additional pertinent ROS:  Constitutional: Negative for chills, fever, night sweats, unintentional weight loss  HENT: Negative for ear pain and hearing loss.   Eyes: Negative for loss of vision and eye pain.  Respiratory: Negative for cough, sputum, shortness of breath, wheezing.   Cardiovascular: Positive for palpitations. Negative for chest pain, PND, orthopnea, lower extremity edema and claudication.  Gastrointestinal: Negative for abdominal pain, melena, and hematochezia.  Genitourinary: Negative for dysuria and hematuria.  Musculoskeletal: Negative for falls and myalgias.  Skin: Negative for itching and rash.  Neurological: Negative for focal weakness, focal sensory changes and loss of consciousness.  Endo/Heme/Allergies: Does not bruise/bleed easily.    EKGs/Labs/Other Studies Reviewed:    The following studies were reviewed today: Brings notes from [place] with her  today.  EKG:  EKG is personally reviewed.  The ekg ordered today demonstrates atrial fibrillation  Recent Labs: 12/07/2017: ALT 8; BUN 14; Creat 0.91; Hemoglobin 12.8; Platelets 181; Potassium 4.3; Sodium 140; TSH 1.74  Recent Lipid Panel    Component Value Date/Time   CHOL 200 (H) 12/07/2017 1004   TRIG 90 12/07/2017 1004   HDL 92 12/07/2017 1004   CHOLHDL 2.2 12/07/2017 1004   LDLCALC 90 12/07/2017 1004    Physical Exam:    VS:  BP 132/90   Pulse 85   Ht 5\' 2"  (1.575 m)   Wt 193 lb (87.5 kg)   BMI 35.30 kg/m     Wt Readings from Last 3 Encounters:  01/17/18 193 lb (87.5 kg)  12/07/17 194 lb (88 kg)     GEN: Well nourished, well developed in no acute distress HEENT: Normal NECK: No JVD; No carotid bruits LYMPHATICS: No lymphadenopathy CARDIAC: regular rhythm, normal S1 and S2, no murmurs, rubs, gallops. Radial and DP pulses 2+ bilaterally. RESPIRATORY:  Clear to auscultation without rales, wheezing or rhonchi  ABDOMEN: Soft, non-tender, non-distended MUSCULOSKELETAL:  No edema; No deformity  SKIN: Warm and dry NEUROLOGIC:  Alert and oriented x 3 PSYCHIATRIC:  Normal affect   ASSESSMENT:    1. Chronic atrial fibrillation   2. Palpitation   3. Essential hypertension   4. Pure hypercholesterolemia    PLAN:    1. Paroxysmal atrial fibrillation: she is symptomatic with her atrial fibrillation. Has never been offered afib ablation before. We discussed that if her LA is very large (given her length of history with afib), it may not be amenable to ablation. Even if LA is large, we can pursue rhythm control with medications to see if her symptoms improve -echocardiogram  to evaluate LA size -stop digoxin. Increase metoprolol dose to improve heart rate control -based on results of echo, either refer to EP for ablation or discuss options for medical rhythm control -order 3 day Zio to determine heart rate variability and burden of afib -CHA2DS2/VAS Stroke Risk Points=3,  continue rivaroxaban for anticoagulation  2. Hypertension: borderline control on lisinopril and metoprolol, follow at next visit  3. Hyperlipidemia (hypercholesterolemia): on rosuvastatin, LDL 90 without prior history of ASCVD  Plan for follow up: 6-8 weeks  Medication Adjustments/Labs and Tests Ordered: Current medicines are reviewed at length with the patient today.  Concerns regarding medicines are outlined above.  Orders Placed This Encounter  Procedures  . LONG TERM MONITOR (3-14 DAYS)  . EKG 12-Lead  . ECHOCARDIOGRAM COMPLETE   Meds ordered this encounter  Medications  . Metoprolol Succinate 50 MG CS24    Sig: Take 150 mg by mouth daily.    Dispense:  270 capsule    Refill:  3    Patient Instructions  Medication Instructions:  Stop: Digoxin 0.125 Increase: Metoprolol 150 mg daily   If you need a refill on your cardiac medications before your next appointment, please call your pharmacy.   Lab work: None  Testing/Procedures: Your physician has requested that you have an echocardiogram. Echocardiography is a painless test that uses sound waves to create images of your heart. It provides your doctor with information about the size and shape of your heart and how well your heart's chambers and valves are working. This procedure takes approximately one hour. There are no restrictions for this procedure. [address]  Our physician has recommended that you wear an 3 DAY ZIO-PATCH monitor. The Zio patch cardiac monitor continuously records heart rhythm data for up to 14 days, this is for patients being evaluated for multiple types heart rhythms. For the first 24 hours post application, please avoid getting the Zio monitor wet in the shower or by excessive sweating during exercise. After that, feel free to carry on with regular activities. Keep soaps and lotions away from the ZIO XT Patch.   This [facility] placed at [facility] - [address],  [address].      Follow-Up: At [facility], you and your health needs are our priority.  As part of our continuing mission to provide you with exceptional heart care, we have created designated Provider Care Teams.  These Care Teams include your primary Cardiologist (physician) and Advanced Practice Providers (APPs -  Physician Assistants and Nurse Practitioners) who all work together to provide you with the care you need, when you need it. You will need a follow up appointment in 6-8 weeks.  Please call our office 2 months in advance to schedule this appointment.  You may see Dr. Cristal Deerhristopher or one of the following Advanced Practice Providers on your designated Care Team:   Theodore DemarkRhonda Barrett, PA-C . Joni ReiningKathryn Lawrence, DNP, ANP        Signed, Jodelle RedBridgette Saxon Barich, MD PhD 01/17/2018 11:01 PM    [facility]

## 2018-01-17 NOTE — Patient Instructions (Signed)
Medication Instructions:  Stop: Digoxin 0.125 Increase: Metoprolol 150 mg daily   If you need a refill on your cardiac medications before your next appointment, please call your pharmacy.   Lab work: None  Testing/Procedures: Your physician has requested that you have an echocardiogram. Echocardiography is a painless test that uses sound waves to create images of your heart. It provides your doctor with information about the size and shape of your heart and how well your heart's chambers and valves are working. This procedure takes approximately one hour. There are no restrictions for this procedure. [address]  Our physician has recommended that you wear an 3 DAY ZIO-PATCH monitor. The Zio patch cardiac monitor continuously records heart rhythm data for up to 14 days, this is for patients being evaluated for multiple types heart rhythms. For the first 24 hours post application, please avoid getting the Zio monitor wet in the shower or by excessive sweating during exercise. After that, feel free to carry on with regular activities. Keep soaps and lotions away from the ZIO XT Patch.   This will be placed at our [facility] location - [address], [address].      Follow-Up: At [facility], you and your health needs are our priority.  As part of our continuing mission to provide you with exceptional heart care, we have created designated Provider Care Teams.  These Care Teams include your primary Cardiologist (physician) and Advanced Practice Providers (APPs -  Physician Assistants and Nurse Practitioners) who all work together to provide you with the care you need, when you need it. You will need a follow up appointment in 6-8 weeks.  Please call our office 2 months in advance to schedule this appointment.  You may see Dr. [name] or one of the following Advanced Practice Providers on your designated Care Team:   Theodore Demark, PA-C . Joni Reining, DNP,  ANP

## 2018-01-23 NOTE — Progress Notes (Signed)
Subjective:   Patient ID: Jennifer Mcclain, female   DOB: 73 y.o.   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  Also complains of mild digital deformity second right occasional discomfort in the Achilles tendon bilateral.  Patient does not smoke and likes to be active   Review of Systems  All other systems reviewed and are negative.       Objective:  Physical Exam Vitals signs and nursing note reviewed.  Constitutional:      Appearance: She is well-developed.  Pulmonary:     Effort: Pulmonary effort is normal.  Musculoskeletal: Normal range of motion.  Skin:    General: Skin is warm.  Neurological:     Mental Status: She is alert.     Neurovascular status intact muscle strength was found to be adequate range of motion within normal limits with patient noted to have an incurvated medial border of the right hallux that is painful when pressed with no redness or drainage noted.  Patient has inflammation around the posterior tibial tendon as it comes under the medial malleolus left that is localized with no muscle strength loss or other pathology and mild discomfort in the Achilles bilateral and slight elevation of the second digit right foot.  Patient is found to have good digital perfusion and is well oriented x3     Assessment:  Ingrown toenail deformity right hallux medial border with pain along with posterior tibial tendinitis left and also mild Achilles tendinitis digital deformity second right     Plan:  H&P and all conditions reviewed with patient.  I recommended correction of the hallux nail right and explained procedure and risk and after signing consent form I infiltrated the right hallux 60 mg Xylocaine Marcaine mixture sterile prep applied  to the toe and using sterile instrumentation remove the medial border exposed matrix and applied phenol 3 applications 30 seconds followed by alcohol lavage and sterile dressing.  I then went ahead and carefully injected the sheath of the left posterior tibial tendon 3 mg Kenalog 5 mg Xylocaine after first explaining risk and doing sterile prep.  I then applied sterile dressing and applied fascial brace to lift the arch educated her on Achilles tendinitis and hammertoe deformity and patient will be seen back to reevaluate  X-ray indicates that there is moderate depression of the arch bilateral with posterior spurring with no indications of advanced arthritis or stress fracture with mild elevation second digit right on lateral view

## 2018-01-24 ENCOUNTER — Telehealth: Payer: Self-pay | Admitting: Cardiology

## 2018-01-24 MED ORDER — METOPROLOL SUCCINATE ER 50 MG PO TB24: 150.0000 mg | ORAL_TABLET | Freq: Every day | ORAL | 3 refills | Status: DC

## 2018-01-24 NOTE — Telephone Encounter (Signed)
New Message    Pt c/o medication issue:  1. Name of Medication: Metoprolol  2. How are you currently taking this medication (dosage and times per day)? 50mg  3x daily   3. Are you having a reaction (difficulty breathing--STAT)? No   4. What is your medication issue? Pt went to pharmacy to get prescription and they wanted to give her a powder form of the medication. She has 2 days left of medication. Please call

## 2018-01-24 NOTE — Telephone Encounter (Signed)
Spoke with pt who states when she went to pick up her prescription from the pharmacy, she was told her metoprolol was sent in a powder form. Inform pt and new Rx will be sent for tablets. Pt verbalized understanding.

## 2018-01-25 ENCOUNTER — Ambulatory Visit (INDEPENDENT_AMBULATORY_CARE_PROVIDER_SITE_OTHER): Payer: Medicare Other

## 2018-01-25 ENCOUNTER — Ambulatory Visit (HOSPITAL_COMMUNITY): Payer: Medicare Other | Attending: Internal Medicine

## 2018-01-25 ENCOUNTER — Other Ambulatory Visit: Payer: Self-pay

## 2018-01-25 DIAGNOSIS — R002 Palpitations: Secondary | ICD-10-CM | POA: Diagnosis not present

## 2018-01-25 DIAGNOSIS — I482 Chronic atrial fibrillation, unspecified: Secondary | ICD-10-CM | POA: Diagnosis not present

## 2018-01-30 ENCOUNTER — Encounter: Payer: Self-pay | Admitting: Podiatry

## 2018-01-30 ENCOUNTER — Ambulatory Visit (INDEPENDENT_AMBULATORY_CARE_PROVIDER_SITE_OTHER): Payer: Medicare Other | Admitting: Podiatry

## 2018-01-30 DIAGNOSIS — L600 Ingrowing nail: Secondary | ICD-10-CM

## 2018-01-30 DIAGNOSIS — M76822 Posterior tibial tendinitis, left leg: Secondary | ICD-10-CM | POA: Diagnosis not present

## 2018-01-31 NOTE — Progress Notes (Signed)
Subjective:   Patient ID: Jennifer Mcclain, female   DOB: 73 y.o.   MRN: [id number]   HPI Pain and states that the nail is improved but the inside of the left ankle has been sore still but not to the same degree as previous F-2 neurovascular status found to be intact no change health history with inflammation moderate around the posterior tibial tendon left still present with patient's ingrown toenail right doing well with good healing and crusted tissue formation   ROS      Objective:  Physical Exam  Above list of the plan and physical exam     Assessment:  Tendinitis left posterior tibial in origin secondary to foot structure with significant diminishment of discomfort and well-healed ingrown toenail site right     Plan:  Discussed the ankle and of recommended continued brace usage ice therapy supportive shoes and orthotics may be necessary.  Reappoint to recheck

## 2018-02-02 ENCOUNTER — Telehealth: Payer: Self-pay | Admitting: Cardiology

## 2018-02-02 ENCOUNTER — Telehealth: Payer: Self-pay

## 2018-02-02 ENCOUNTER — Encounter: Payer: Self-pay | Admitting: Cardiology

## 2018-02-02 ENCOUNTER — Ambulatory Visit (INDEPENDENT_AMBULATORY_CARE_PROVIDER_SITE_OTHER): Payer: Medicare Other | Admitting: Cardiology

## 2018-02-02 VITALS — HR 113 | Ht 62.0 in | Wt 192.8 lb

## 2018-02-02 DIAGNOSIS — I5189 Other ill-defined heart diseases: Secondary | ICD-10-CM | POA: Diagnosis not present

## 2018-02-02 DIAGNOSIS — I340 Nonrheumatic mitral (valve) insufficiency: Secondary | ICD-10-CM | POA: Diagnosis not present

## 2018-02-02 DIAGNOSIS — I428 Other cardiomyopathies: Secondary | ICD-10-CM | POA: Insufficient documentation

## 2018-02-02 DIAGNOSIS — I429 Cardiomyopathy, unspecified: Secondary | ICD-10-CM | POA: Diagnosis not present

## 2018-02-02 DIAGNOSIS — Z01812 Encounter for preprocedural laboratory examination: Secondary | ICD-10-CM

## 2018-02-02 DIAGNOSIS — I361 Nonrheumatic tricuspid (valve) insufficiency: Secondary | ICD-10-CM | POA: Diagnosis not present

## 2018-02-02 DIAGNOSIS — I4811 Longstanding persistent atrial fibrillation: Secondary | ICD-10-CM

## 2018-02-02 DIAGNOSIS — I482 Chronic atrial fibrillation, unspecified: Secondary | ICD-10-CM | POA: Diagnosis not present

## 2018-02-02 LAB — BASIC METABOLIC PANEL
BUN/Creatinine Ratio: 19 (ref 12–28)
BUN: 18 mg/dL (ref 8–27)
CO2: 25 mmol/L (ref 20–29)
Calcium: 10 mg/dL (ref 8.7–10.3)
Chloride: 99 mmol/L (ref 96–106)
Creatinine, Ser: 0.97 mg/dL (ref 0.57–1.00)
GFR calc Af Amer: 68 mL/min/{1.73_m2} (ref >59)
GFR calc non Af Amer: 59 mL/min/{1.73_m2} — ABNORMAL LOW (ref >59)
Glucose: 78 mg/dL (ref 65–99)
Potassium: 4.5 mmol/L (ref 3.5–5.2)
Sodium: 138 mmol/L (ref 134–144)

## 2018-02-02 LAB — CBC
Hematocrit: 38.6 % (ref 34.0–46.6)
Hemoglobin: 12.8 g/dL (ref 11.1–15.9)
MCH: 31.8 pg (ref 26.6–33.0)
MCHC: 33.2 g/dL (ref 31.5–35.7)
MCV: 96 fL (ref 79–97)
Platelets: 179 10*3/uL (ref 150–450)
RBC: 4.02 x10E6/uL (ref 3.77–5.28)
RDW: 12.3 % (ref 11.7–15.4)
WBC: 5.7 10*3/uL (ref 3.4–10.8)

## 2018-02-02 MED ORDER — AMIODARONE HCL 200 MG PO TABS: 200.0000 mg | ORAL_TABLET | Freq: Every day | ORAL | 3 refills | Status: DC

## 2018-02-02 MED ORDER — AMIODARONE HCL 200 MG PO TABS: 200.0000 mg | ORAL_TABLET | Freq: Two times a day (BID) | ORAL | 0 refills | Status: DC

## 2018-02-02 NOTE — Telephone Encounter (Signed)
Spoke with a Pharmacy Rep from [facility] who states they received the Rx for amiodarone today and would like to make MD aware of medication interaction with Digoxin. Inform Rep that pt's Digoxin was d/c at last OV on 01/17/2018. Rep verbalized understanding.

## 2018-02-02 NOTE — Progress Notes (Signed)
Cardiology Office Note:    Date:  02/02/2018   ID:  Jennifer LedererRoberta Lizer, DOB 1945-03-05, MRN [id number]  PCP:  Sharon SellerEubanks, Jessica K, NP  Cardiologist:  Jodelle RedBridgette Vaness Jelinski, MD PhD  Referring MD: Sharon SellerEubanks, Jessica K, NP   CC: follow up abnormal echo  History of Present Illness:    Jennifer Mcclain is a 73 y.o. female with a hx of atrial fibrillation, hyperlipidemia, hypertension who is seen for follow up due to abnormal echo results. Initial consult with me was on 01/17/18 for atrial fibrillation.  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 Had an ablation done, but was told part of it was too close to the His bundle and so the EP cardiologist ablated all around it but not in the bundle. Moved to [place] about 10 years ago, was told there was no evidence of WPW. She had an episode of afib about 5 years ago that was fast, but has otherwise been well controlled. She can feel when she has atrial fibrillation, usually a few times/day. Doesn't stop her from doing her daily activities. Doesn't think it lasts long. No syncope. No chest pain. Does have some shortness of breath.  Daughter passed away from heart disease; had a kink in one of her arteries, died at age 71.  Today: we ordered an echocardiogram at the last visit for evaluation, which returned as significantly abnormal. Her ejection fraction is 35-40%, and she has moderate MR and moderate-severe TR.   We spent time today discussing causes of LV dysfunction and valve disease, and we discussed workup (including cath and TEE) and referral depending on results of testing. We also discussed her atrial fibrillation and options for rate vs. Rhythm control. See discussion below.  Past Medical History:  Diagnosis Date  . Achilles tendinitis    [facility]   . Cerebrovascular disease    [facility]   . Chronic atrial fibrillation    [facility]   . Chronic atrial fibrillation    Evalina Fieldichard Miller,  MD  . Disorder of carotid artery [facility](HCC)    Lynden AngMelissa James, MD  . Dyslipidemia    Evalina Fieldichard Miller, MD  . H/O calculus of kidney during pregnancy    Lynden AngMelissa James, MD  . H/O hysterectomy for benign disease    Lynden AngMelissa James, MD  . Humerus fracture    left leg- due to fall  . Hypertension    [facility]   . Lactose intolerance    [facility]   . Multiple actinic keratoses    Lynden AngMelissa James, MD  . Non-toxic multinodular goiter 01/21/2017   Lynden AngMelissa James, MD  . Normocytic anemia    Lynden AngMelissa James, MD  . Obstructive sleep apnea syndrome    Evalina Fieldichard Miller, MD  . Osteoarthritis    [facility]   . Osteopenia    [facility]   . Renal cyst 10/11/2012   Lynden AngMelissa James, MD  . Sensorineural hearing loss (SNHL), bilateral 01/28/2017   Lynden AngMelissa James , MD  . Umbilical hernia    [facility]   . Vitamin D deficiency    Lynden AngMelissa james, MD    Past Surgical History:  Procedure Laterality Date  . ABDOMINAL HYSTERECTOMY  1989   [facility] ovary[facility]   . ABLATION  1996   Radical, [facility]   . COLONOSCOPY  2019   Dr.Melissa Fayrene Fearing, Per [facility] New Patient packet   . KNEE ARTHROSCOPY W/ MENISCAL REPAIR  2002   Per [facility] New Patient packet   . REPLACEMENT TOTAL KNEE Left 2015   Per [facility] New Patient packet  . TONSILLECTOMY  1951   Per [facility] New Patient packet     Current Medications: Current Outpatient Medications on File Prior to Visit  Medication Sig  . acetaminophen (TYLENOL) 650 MG CR tablet Take 650 mg by mouth 2 (two) times daily.   . Cholecalciferol (VITAMIN D3) 25 MCG (1000 UT) CAPS Take 1,000 Units by mouth daily.   Marland Kitchen lisinopril (PRINIVIL,ZESTRIL) 20 MG tablet Take 1 tablet (20 mg total) by mouth daily.  . metoprolol succinate (TOPROL-XL) 50 MG 24 hr tablet Take 3 tablets (150 mg total) by mouth daily. Take with or immediately following a meal. (Patient taking differently: Take 50-100 mg  by mouth See admin instructions. Take 100 mg by mouth in the morning and take 50 mg by mouth in the evening)  . Multiple Vitamins-Minerals (OCUVITE PO) Take 1 tablet by mouth daily.   Marland Kitchen neomycin-polymyxin-hydrocortisone (CORTISPORIN) OTIC solution Apply 1-2 drops to toe after soaking twice a day (Patient not taking: Reported on 02/02/2018)  . Oxymetazoline HCl (SINEX ULTRA FINE MIST 12-HOUR NA) Place 1 spray into both nostrils daily.   . polycarbophil (FIBERCON) 625 MG tablet Take 1,250 mg by mouth daily.  . Probiotic Product (PHILLIPS COLON HEALTH PO) Take 1 capsule by mouth daily.   . rosuvastatin (CRESTOR) 5 MG tablet Take 5 mg by mouth 3 (three) times a week.  Carlena Hurl 20 MG TABS tablet TAKE ONE TABLET BY MOUTH DAILY WITH SUPPER (Patient taking differently: Take 20 mg by mouth daily with supper. High risk med: Anticoagulant.  Crushed Xarelto can be given down a G-tube but NOT a J-Tube.)   No current facility-administered medications on file prior to visit.      Allergies:   Lactose intolerance (gi); Naprosyn [naproxen]; Coq10 [coenzyme q10]; Latex; and Zocor [simvastatin]   Social History   Socioeconomic History  . Marital status: Married    Spouse name: Not on file  . Number of children: Not on file  . Years of education: Not on file  . Highest education level: Not on file  Occupational History  . Not on file  Social Needs  . Financial resource strain: Not on file  . Food insecurity:    Worry: Not on file    Inability: Not on file  . Transportation needs:    Medical: Not on file    Non-medical: Not on file  Tobacco Use  . Smoking status: Never Smoker  . Smokeless tobacco: Never Used  Substance and Sexual Activity  . Alcohol use: Yes    Comment: 1-2 drinks weekly - social   . Drug use: Never  . Sexual activity: Not on file  Lifestyle  . Physical activity:    Days per week: Not on file    Minutes per session: Not on file  . Stress: Not on file  Relationships  . Social  connections:    Talks on phone: Not on file    Gets together: Not on file    Attends religious service: Not on file    Active member of club or organization: Not on file    Attends meetings of clubs or organizations: Not on file    Relationship status: Not on file  Other Topics Concern  . 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 No   Do you have difficulty preparing food or eating? No   Do you have difficulty managing your medications? No   Do you have difficulty managing your finances? No   Do you have difficulty affording your medications? No     Family History: The patient's family history includes AAA (abdominal aortic aneurysm) in her sister; Alzheimer's disease (age of onset: 6682) in her father; Arthritis (age of onset: 4650) in her mother; Breast cancer in her paternal aunt; CVA in her maternal grandfather; Cancer in her maternal grandfather; Congenital heart disease in her daughter; Congestive Heart Failure in her mother; Dementia in her father; Diabetes in her father and sister; Diabetes (age of onset: 7640) in her mother; Diabetes (age of onset: 7760) in her maternal grandfather; Heart attack in her maternal aunt; Hyperlipidemia in her father; Lung cancer (age of onset: 5280) in her maternal grandmother; Stroke in her father.  ROS:   Please see the history of present illness.  Additional pertinent ROS:  Constitutional: Negative for chills,  fever, night sweats, unintentional weight loss  HENT: Negative for ear pain and hearing loss.   Eyes: Negative for loss of vision and eye pain.  Respiratory: Negative for cough, sputum, shortness of breath, wheezing.   Cardiovascular: Positive for palpitations. Negative for chest pain, PND, orthopnea, lower extremity edema and claudication.  Gastrointestinal: Negative for abdominal pain, melena, and hematochezia.  Genitourinary: Negative for dysuria and hematuria.  Musculoskeletal: Negative for falls and myalgias.  Skin: Negative for itching and rash.  Neurological: Negative for focal weakness, focal sensory changes and loss of consciousness.  Endo/Heme/Allergies: Does not bruise/bleed easily.    EKGs/Labs/Other Studies Reviewed:    The following studies were reviewed today: Echo 01/25/18 Study Conclusions  - Left ventricle: The cavity size was normal. Systolic function was   moderately reduced. The estimated ejection fraction was in the   range of 35% to 40%. Although no diagnostic regional wall motion   abnormality was identified, this possibility cannot be completely   excluded on the basis of this study. - Aortic valve: Transvalvular velocity was within the normal range.   There was no stenosis. There was no regurgitation. - Mitral valve: Moderately calcified annulus. There was moderate   regurgitation. - Left atrium: The atrium was moderately dilated. - Right ventricle: The cavity size was mildly dilated. Wall   thickness was normal. Systolic function was normal. RV systolic   pressure (S, est): 44 mm Hg. - Right atrium: The atrium was moderately dilated. Central venous   pressure (est): 10 mm Hg. - Tricuspid valve: Dilated annulus. Structurally normal valve.   There was moderate-severe regurgitation. - Pulmonic valve: There was mild regurgitation. - Pulmonary arteries: Systolic  pressure was moderately increased.   PA peak pressure: 44 mm Hg (S). - Inferior vena cava: The  vessel was normal in size. The   respirophasic diameter changes were blunted (< 50%). - Pericardium, extracardiac: There was no pericardial effusion.  EKG:  EKG is personally reviewed.  The ekg ordered today demonstrates atrial fibrillation with RVR, rate 106 bpm.   Recent Labs: 12/07/2017: ALT 8; BUN 14; Creat 0.91; Hemoglobin 12.8; Platelets 181; Potassium 4.3; Sodium 140; TSH 1.74  Recent Lipid Panel    Component Value Date/Time   CHOL 200 (H) 12/07/2017 1004   TRIG 90 12/07/2017 1004   HDL 92 12/07/2017 1004   CHOLHDL 2.2 12/07/2017 1004   LDLCALC 90 12/07/2017 1004    Physical Exam:    VS:  Pulse (!) 113   Ht 5\' 2"  (1.575 m)   Wt 192 lb 12.8 oz (87.5 kg)   BMI 35.26 kg/m     Wt Readings from Last 3 Encounters:  02/02/18 192 lb 12.8 oz (87.5 kg)  01/17/18 193 lb (87.5 kg)  12/07/17 194 lb (88 kg)     GEN: Well nourished, well developed in no acute distress HEENT: Normal NECK: No JVD; No carotid bruits LYMPHATICS: No lymphadenopathy CARDIAC: tachycardic, irregularly irregular rhythm, normal S1 and S2, did not appreciate murmurs, rubs, gallops. Radial and DP pulses 2+ bilaterally. RESPIRATORY:  Clear to auscultation without rales, wheezing or rhonchi  ABDOMEN: Soft, non-tender, non-distended MUSCULOSKELETAL:  No edema; No deformity  SKIN: Warm and dry NEUROLOGIC:  Alert and oriented x 3 PSYCHIATRIC:  Normal affect   ASSESSMENT:    1. Chronic atrial fibrillation   2. Pre-procedure lab exam   3. 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 Cardiomyopathy, unspecified type (HCC)   5. Nonrheumatic mitral valve regurgitation   6. Nonrheumatic tricuspid valve regurgitation    PLAN:    1. Abnormal echo, with reduced EF, moderate MR, and moderate-severe TR: I did not appreciate murmurs today on exam, but she is tachycardic and irregular rhythm due to her afib. Appears euvolemic. First time she has ever been told she has systolic dysfunction. Unclear etiology of her  cardiomyopathy. Never had cath, no known history of ischemic disease. Consider CAD vs. Tachyarrhythmia induced vs. Other cause  -discussed workup at length. Will plan for right and left heart cath for further evaluation. Discussed at length today. Risks and benefits of cardiac catheterization have been discussed with the patient.  These include bleeding, infection, kidney damage, stroke, heart attack, death.  The patient understands these risks and is willing to proceed.  -hold rivaroxaban for 3 days prior (possible groin access given left and right heart cath). Hold lisinopril per protocol. Check preprocedure labs. -medical management: given unclear etiology and unknown potential for reversibility, will continue lisinopril and metoprolol succinate for now. If no reversible etiology found on workup, would consider entresto.  2. Likely transition from paroxysmal to persistent atrial fibrillation: she is symptomatic with her atrial fibrillation. Has now been in afib for some time. Given low EF, discussed that only options for medical rhythm control are dofetilide and amiodarone. Discussed both in detail. As she is not rate controlled on 150 mg metoprolol succinate daily, she would like to pursue a rhythm control strategy with amiodarone. Will load with 200 mg BID for two weeks, then 200 mg daily.  -recent TFTs and LFTs WNL. Will check PFTs at follow up.  -CHA2DS2/VAS Stroke Risk Points=3, continue rivaroxaban for anticoagulation (except for hold at time of  cath). No recent missed doses.  3. Mitral and tricuspid regurgitation: depending on results of cath, as well as response to medications, would evaluate further with TEE. We discussed this today. If no CAD on cath, and EF/chamber size improve with medical management, regurgitation may improve if this is primarily secondary regurgitation. However, if planned for surgery, would evaluate valves to determine what repair vs. Replacement might be beneficial.  Appears euvolemic, not limited in her activities. Instructed her that if she does notice limitation, symptoms of HF, she should call me and we will do TEE before next visit. Otherwise she prefers to come back and address results of cath in person.  Not discussed in depth at this visit: Hypertension: borderline control on lisinopril and metoprolol, follow at next visit Hyperlipidemia (hypercholesterolemia): on rosuvastatin, LDL 90 without prior history of ASCVD  Plan for follow up: 3 weeks to follow up cath results, response to amiodarone  TIME SPENT WITH PATIENT: >40 minutes of direct patient care. More than 50% of that time was spent on coordination of care and counseling regarding test results, next steps for workup, cath consent.  [name], MD, PhD [place]  [facility]   Medication Adjustments/Labs and Tests Ordered: Current medicines are reviewed at length with the patient today.  Concerns regarding medicines are outlined above.  Orders Placed This Encounter  Procedures  . CBC  . Basic metabolic panel  . EKG 12-Lead   Meds ordered this encounter  Medications  . amiodarone (PACERONE) 200 MG tablet    Sig: Take 1 tablet (200 mg total) by mouth daily.    Dispense:  90 tablet    Refill:  3  . amiodarone (PACERONE) 200 MG tablet    Sig: Take 1 tablet (200 mg total) by mouth 2 (two) times daily. For two weeks    Dispense:  14 tablet    Refill:  0    Patient Instructions  Medication Instructions:  Start: Amiodarone 200 mg two times a day for 2 weeks then switch to once a day thereafter.     If you need a refill on your cardiac medications before your next appointment, please call your pharmacy.   Lab work: Your physician recommends that you return for lab work today (CBC, BMP)  If you have labs (blood work) drawn today and your tests are completely normal, you will receive your results only by: Marland Kitchen MyChart Message (if you have MyChart) OR . A paper copy in  the mail If you have any lab test that is abnormal or we need to change your treatment, we will call you to review the results.  Testing/Procedures: Your physician has requested that you have a cardiac catheterization. Cardiac catheterization is used to diagnose and/or treat various heart conditions. Doctors may recommend this procedure for a number of different reasons. The most common reason is to evaluate chest pain. Chest pain can be a symptom of coronary artery disease (CAD), and cardiac catheterization can show whether plaque is narrowing or blocking your heart's arteries. This procedure is also used to evaluate the valves, as well as measure the blood flow and oxygen levels in different parts of your heart. For further information please visit [email or web address] Please follow instruction sheet, as given. [facility]    Follow-Up: At [facility], you and your health needs are our priority.  As part of our continuing mission to provide you with exceptional heart care, we have created designated Provider Care Teams.  These Care Teams include  your primary Cardiologist (physician) and Advanced Practice Providers (APPs -  Physician Assistants and Nurse Practitioners) who all work together to provide you with the care you need, when you need it. You will need a follow up appointment in 3 weeks.  Please call our office 2 months in advance to schedule this appointment.  You may see [name], MD or one of the following Advanced Practice Providers on your designated Care Team:   Theodore Demark, PA-C . Joni Reining, DNP, ANP      [facility] CARDIOVASCULAR DIVISION [facility] [address] [place] [address] [place] [address] Dept: [phone] Loc: [phone]  Shavawn Stobaugh  02/02/2018  You are scheduled for a Cardiac Catheterization on Tuesday, January 28 with Dr. Peter [place].  1. Please arrive at the [facility] (Main  Entrance A) at [facility]: [address] [place], [place] [address] at 5:30 AM (This time is two hours before your procedure to ensure your preparation). Free valet parking service is available.   Special note: Every effort is made to have your procedure done on time. Please understand that emergencies sometimes delay scheduled procedures.  2. Diet: Do not eat solid foods after midnight.  The patient may have clear liquids until 5am upon the day of the procedure.  3. Labs: You will need to have blood drawn today CBC, BMP  4. Medication instructions in preparation for your procedure:   Contrast Allergy: No  Hold Xarelto 3 days prior to procedure-Starting 02/02/2018  Hold Lisinopril day before and day of procedure    On the morning of your procedure, take your Aspirin and any morning medicines NOT listed above.  You may use sips of water.  5. Plan for one night stay--bring personal belongings. 6. Bring a current list of your medications and current insurance cards. 7. You MUST have a responsible person to drive you home. 8. Someone MUST be with you the first 24 hours after you arrive home or your discharge will be delayed. 9. Please wear clothes that are easy to get on and off and wear slip-on shoes.  Thank you for allowing [place] to care for you!   -- [place] [facility]       Signed, [name], MD PhD 02/02/2018 2:03 PM    [facility]

## 2018-02-02 NOTE — H&P (View-Only) (Signed)
Cardiology Office Note:    Date:  02/02/2018   ID:  Jennifer Mcclain, DOB 12/05/1945, MRN [id number]  PCP:  Eubanks, Jessica K, NP  Cardiologist:  Anwyn Kriegel, MD PhD  Referring MD: Eubanks, Jessica K, NP   CC: follow up abnormal echo  History of Present Illness:    Jennifer Mcclain is a 73 y.o. female with a hx of atrial fibrillation, hyperlipidemia, hypertension who is seen for follow up due to abnormal echo results. Initial consult with me was on 01/17/18 for atrial fibrillation.  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 Had an ablation done, but was told part of it was too close to the His bundle and so the EP cardiologist ablated all around it but not in the bundle. Moved to [place] about 10 years ago, was told there was no evidence of WPW. She had an episode of afib about 5 years ago that was fast, but has otherwise been well controlled. She can feel when she has atrial fibrillation, usually a few times/day. Doesn't stop her from doing her daily activities. Doesn't think it lasts long. No syncope. No chest pain. Does have some shortness of breath.  Daughter passed away from heart disease; had a kink in one of her arteries, died at age 28.  Today: we ordered an echocardiogram at the last visit for evaluation, which returned as significantly abnormal. Her ejection fraction is 35-40%, and she has moderate MR and moderate-severe TR.   We spent time today discussing causes of LV dysfunction and valve disease, and we discussed workup (including cath and TEE) and referral depending on results of testing. We also discussed her atrial fibrillation and options for rate vs. Rhythm control. See discussion below.  Past Medical History:  Diagnosis Date  . Achilles tendinitis    Per [facility] New Patient packet   . Cerebrovascular disease    Per [facility] New Patient packet   . Chronic atrial fibrillation    Per [facility] New Patient packet   . Chronic atrial fibrillation    Richard Miller,  MD  . Disorder of carotid artery (HCC)    Melissa James, MD  . Dyslipidemia    Richard Miller, MD  . H/O calculus of kidney during pregnancy    Melissa James, MD  . H/O hysterectomy for benign disease    Melissa James, MD  . Humerus fracture    left leg- due to fall  . Hypertension    Per [facility] New Patient packet   . Lactose intolerance    Per [facility] Patient packet   . Multiple actinic keratoses    Melissa James, MD  . Non-toxic multinodular goiter 01/21/2017   Melissa James, MD  . Normocytic anemia    Melissa James, MD  . Obstructive sleep apnea syndrome    Richard Miller, MD  . Osteoarthritis    Per [facility] packet   . Osteopenia    Per [facility] Patient packet   . Renal cyst 10/11/2012   Melissa James, MD  . Sensorineural hearing loss (SNHL), bilateral 01/28/2017   Melissa James , MD  . Umbilical hernia    Per [facility] Patient packet   . Vitamin D deficiency    Melissa james, MD    Past Surgical History:  Procedure Laterality Date  . ABDOMINAL HYSTERECTOMY  1989   One ovary, Per [facility] Patient packet   . ABLATION  1996   Radical, Per [facility] New Patient packet   . COLONOSCOPY    2019   Dr.Melissa James, Per [facility] New Patient packet   . KNEE ARTHROSCOPY W/ MENISCAL REPAIR  2002   Per [facility] New Patient packet   . REPLACEMENT TOTAL KNEE Left 2015   Per [facility] Patient packet  . TONSILLECTOMY  1951   Per [facility] New Patient packet     Current Medications: Current Outpatient Medications on File Prior to Visit  Medication Sig  . acetaminophen (TYLENOL) 650 MG CR tablet Take 650 mg by mouth 2 (two) times daily.   . Cholecalciferol (VITAMIN D3) 25 MCG (1000 UT) CAPS Take 1,000 Units by mouth daily.   . lisinopril (PRINIVIL,ZESTRIL) 20 MG tablet Take 1 tablet (20 mg total) by mouth daily.  . metoprolol succinate (TOPROL-XL) 50 MG 24 hr tablet Take 3 tablets (150 mg total) by mouth daily. Take with or immediately following a meal. (Patient taking differently: Take 50-100 mg  by mouth See admin instructions. Take 100 mg by mouth in the morning and take 50 mg by mouth in the evening)  . Multiple Vitamins-Minerals (OCUVITE PO) Take 1 tablet by mouth daily.   . neomycin-polymyxin-hydrocortisone (CORTISPORIN) OTIC solution Apply 1-2 drops to toe after soaking twice a day (Patient not taking: Reported on 02/02/2018)  . Oxymetazoline HCl (SINEX ULTRA FINE MIST 12-HOUR NA) Place 1 spray into both nostrils daily.   . polycarbophil (FIBERCON) 625 MG tablet Take 1,250 mg by mouth daily.  . Probiotic Product (PHILLIPS COLON HEALTH PO) Take 1 capsule by mouth daily.   . rosuvastatin (CRESTOR) 5 MG tablet Take 5 mg by mouth 3 (three) times a week.  . XARELTO 20 MG TABS tablet TAKE ONE TABLET BY MOUTH DAILY WITH SUPPER (Patient taking differently: Take 20 mg by mouth daily with supper. High risk med: Anticoagulant.  Crushed Xarelto can be given down a G-tube but NOT a J-Tube.)   No current facility-administered medications on file prior to visit.      Allergies:   Lactose intolerance (gi); Naprosyn [naproxen]; Coq10 [coenzyme q10]; Latex; and Zocor [simvastatin]   Social History   Socioeconomic History  . Marital status: Married    Spouse name: Not on file  . Number of children: Not on file  . Years of education: Not on file  . Highest education level: Not on file  Occupational History  . Not on file  Social Needs  . Financial resource strain: Not on file  . Food insecurity:    Worry: Not on file    Inability: Not on file  . Transportation needs:    Medical: Not on file    Non-medical: Not on file  Tobacco Use  . Smoking status: Never Smoker  . Smokeless tobacco: Never Used  Substance and Sexual Activity  . Alcohol use: Yes    Comment: 1-2 drinks weekly - social   . Drug use: Never  . Sexual activity: Not on file  Lifestyle  . Physical activity:    Days per week: Not on file    Minutes per session: Not on file  . Stress: Not on file  Relationships  . Social  connections:    Talks on phone: Not on file    Gets together: Not on file    Attends religious service: Not on file    Active member of club or organization: Not on file    Attends meetings of clubs or organizations: Not on file    Relationship status: Not on file  Other Topics Concern  . 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 No   Do you have difficulty preparing food or eating? No   Do you have difficulty managing your medications? No   Do you have difficulty managing your finances? No   Do you have difficulty affording your medications? No     Family History: The patient's family history includes AAA (abdominal aortic aneurysm) in her sister; Alzheimer's disease (age of onset: 82) in her father; Arthritis (age of onset: 50) in her mother; Breast cancer in her paternal aunt; CVA in her maternal grandfather; Cancer in her maternal grandfather; Congenital heart disease in her daughter; Congestive Heart Failure in her mother; Dementia in her father; Diabetes in her father and sister; Diabetes (age of onset: 40) in her mother; Diabetes (age of onset: 60) in her maternal grandfather; Heart attack in her maternal aunt; Hyperlipidemia in her father; Lung cancer (age of onset: 80) in her maternal grandmother; Stroke in her father.  ROS:   Please see the history of present illness.  Additional pertinent ROS:  Constitutional: Negative for chills,  fever, night sweats, unintentional weight loss  HENT: Negative for ear pain and hearing loss.   Eyes: Negative for loss of vision and eye pain.  Respiratory: Negative for cough, sputum, shortness of breath, wheezing.   Cardiovascular: Positive for palpitations. Negative for chest pain, PND, orthopnea, lower extremity edema and claudication.  Gastrointestinal: Negative for abdominal pain, melena, and hematochezia.  Genitourinary: Negative for dysuria and hematuria.  Musculoskeletal: Negative for falls and myalgias.  Skin: Negative for itching and rash.  Neurological: Negative for focal weakness, focal sensory changes and loss of consciousness.  Endo/Heme/Allergies: Does not bruise/bleed easily.    EKGs/Labs/Other Studies Reviewed:    The following studies were reviewed today: Echo 01/25/18 Study Conclusions  - Left ventricle: The cavity size was normal. Systolic function was   moderately reduced. The estimated ejection fraction was in the   range of 35% to 40%. Although no diagnostic regional wall motion   abnormality was identified, this possibility cannot be completely   excluded on the basis of this study. - Aortic valve: Transvalvular velocity was within the normal range.   There was no stenosis. There was no regurgitation. - Mitral valve: Moderately calcified annulus. There was moderate   regurgitation. - Left atrium: The atrium was moderately dilated. - Right ventricle: The cavity size was mildly dilated. Wall   thickness was normal. Systolic function was normal. RV systolic   pressure (S, est): 44 mm Hg. - Right atrium: The atrium was moderately dilated. Central venous   pressure (est): 10 mm Hg. - Tricuspid valve: Dilated annulus. Structurally normal valve.   There was moderate-severe regurgitation. - Pulmonic valve: There was mild regurgitation. - Pulmonary arteries: Systolic   pressure was moderately increased.   PA peak pressure: 44 mm Hg (S). - Inferior vena cava: The  vessel was normal in size. The   respirophasic diameter changes were blunted (< 50%). - Pericardium, extracardiac: There was no pericardial effusion.  EKG:  EKG is personally reviewed.  The ekg ordered today demonstrates atrial fibrillation with RVR, rate 106 bpm.   Recent Labs: 12/07/2017: ALT 8; BUN 14; Creat 0.91; Hemoglobin 12.8; Platelets 181; Potassium 4.3; Sodium 140; TSH 1.74  Recent Lipid Panel    Component Value Date/Time   CHOL 200 (H) 12/07/2017 1004   TRIG 90 12/07/2017 1004   HDL 92 12/07/2017 1004   CHOLHDL 2.2 12/07/2017 1004   LDLCALC 90 12/07/2017 1004    Physical Exam:    VS:  Pulse (!) 113   Ht 5' 2" (1.575 m)   Wt 192 lb 12.8 oz (87.5 kg)   BMI 35.26 kg/m     Wt Readings from Last 3 Encounters:  02/02/18 192 lb 12.8 oz (87.5 kg)  01/17/18 193 lb (87.5 kg)  12/07/17 194 lb (88 kg)     GEN: Well nourished, well developed in no acute distress HEENT: Normal NECK: No JVD; No carotid bruits LYMPHATICS: No lymphadenopathy CARDIAC: tachycardic, irregularly irregular rhythm, normal S1 and S2, did not appreciate murmurs, rubs, gallops. Radial and DP pulses 2+ bilaterally. RESPIRATORY:  Clear to auscultation without rales, wheezing or rhonchi  ABDOMEN: Soft, non-tender, non-distended MUSCULOSKELETAL:  No edema; No deformity  SKIN: Warm and dry NEUROLOGIC:  Alert and oriented x 3 PSYCHIATRIC:  Normal affect   ASSESSMENT:    1. Chronic atrial fibrillation   2. Pre-procedure lab exam   3. 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 Cardiomyopathy, unspecified type (HCC)   5. Nonrheumatic mitral valve regurgitation   6. Nonrheumatic tricuspid valve regurgitation    PLAN:    1. Abnormal echo, with reduced EF, moderate MR, and moderate-severe TR: I did not appreciate murmurs today on exam, but she is tachycardic and irregular rhythm due to her afib. Appears euvolemic. First time she has ever been told she has systolic dysfunction. Unclear etiology of her  cardiomyopathy. Never had cath, no known history of ischemic disease. Consider CAD vs. Tachyarrhythmia induced vs. Other cause  -discussed workup at length. Will plan for right and left heart cath for further evaluation. Discussed at length today. Risks and benefits of cardiac catheterization have been discussed with the patient.  These include bleeding, infection, kidney damage, stroke, heart attack, death.  The patient understands these risks and is willing to proceed.  -hold rivaroxaban for 3 days prior (possible groin access given left and right heart cath). Hold lisinopril per protocol. Check preprocedure labs. -medical management: given unclear etiology and unknown potential for reversibility, will continue lisinopril and metoprolol succinate for now. If no reversible etiology found on workup, would consider entresto.  2. Likely transition from paroxysmal to persistent atrial fibrillation: she is symptomatic with her atrial fibrillation. Has now been in afib for some time. Given low EF, discussed that only options for medical rhythm control are dofetilide and amiodarone. Discussed both in detail. As she is not rate controlled on 150 mg metoprolol succinate daily, she would like to pursue a rhythm control strategy with amiodarone. Will load with 200 mg BID for two weeks, then 200 mg daily.  -recent TFTs and LFTs WNL. Will check PFTs at follow up.  -CHA2DS2/VAS Stroke Risk Points=3, continue rivaroxaban for anticoagulation (except for hold at time of   cath). No recent missed doses.  3. Mitral and tricuspid regurgitation: depending on results of cath, as well as response to medications, would evaluate further with TEE. We discussed this today. If no CAD on cath, and EF/chamber size improve with medical management, regurgitation may improve if this is primarily secondary regurgitation. However, if planned for surgery, would evaluate valves to determine what repair vs. Replacement might be beneficial.  Appears euvolemic, not limited in her activities. Instructed her that if she does notice limitation, symptoms of HF, she should call me and we will do TEE before next visit. Otherwise she prefers to come back and address results of cath in person.  Not discussed in depth at this visit: Hypertension: borderline control on lisinopril and metoprolol, follow at next visit Hyperlipidemia (hypercholesterolemia): on rosuvastatin, LDL 90 without prior history of ASCVD  Plan for follow up: 3 weeks to follow up cath results, response to amiodarone  TIME SPENT WITH PATIENT: >40 minutes of direct patient care. More than 50% of that time was spent on coordination of care and counseling regarding test results, next steps for workup, cath consent.  Mathis Cashman, MD, PhD [place]  [facility]   Medication Adjustments/Labs and Tests Ordered: Current medicines are reviewed at length with the patient today.  Concerns regarding medicines are outlined above.  Orders Placed This Encounter  Procedures  . CBC  . Basic metabolic panel  . EKG 12-Lead   Meds ordered this encounter  Medications  . amiodarone (PACERONE) 200 MG tablet    Sig: Take 1 tablet (200 mg total) by mouth daily.    Dispense:  90 tablet    Refill:  3  . amiodarone (PACERONE) 200 MG tablet    Sig: Take 1 tablet (200 mg total) by mouth 2 (two) times daily. For two weeks    Dispense:  14 tablet    Refill:  0    Patient Instructions  Medication Instructions:  Start: Amiodarone 200 mg two times a day for 2 weeks then switch to once a day thereafter.     If you need a refill on your cardiac medications before your next appointment, please call your pharmacy.   Lab work: Your physician recommends that you return for lab work today (CBC, BMP)  If you have labs (blood work) drawn today and your tests are completely normal, you will receive your results only by: . MyChart Message (if you have MyChart) OR . A paper copy in  the mail If you have any lab test that is abnormal or we need to change your treatment, we will call you to review the results.  Testing/Procedures: Your physician has requested that you have a cardiac catheterization. Cardiac catheterization is used to diagnose and/or treat various heart conditions. Doctors may recommend this procedure for a number of different reasons. The most common reason is to evaluate chest pain. Chest pain can be a symptom of coronary artery disease (CAD), and cardiac catheterization can show whether plaque is narrowing or blocking your heart's arteries. This procedure is also used to evaluate the valves, as well as measure the blood flow and oxygen levels in different parts of your heart. For further information please visit www.[email or web address]. Please follow instruction sheet, as given. [facility]    Follow-Up: At [facility], you and your health needs are our priority.  As part of our continuing mission to provide you with exceptional heart care, we have created designated Provider Care Teams.  These Care Teams include   your primary Cardiologist (physician) and Advanced Practice Providers (APPs -  Physician Assistants and Nurse Practitioners) who all work together to provide you with the care you need, when you need it. You will need a follow up appointment in 3 weeks.  Please call our office 2 months in advance to schedule this appointment.  You may see Dantrell Schertzer, MD or one of the following Advanced Practice Providers on your designated Care Team:   Rhonda Barrett, PA-C . Kathryn Lawrence, DNP, ANP      [facility]ARD[facility] [address] [place] [address] Dept: [phone] Loc: [phone]  Sabreen Ostergaard  02/02/2018  You are scheduled for a Cardiac Catheterization on Tuesday, January 28 with Dr. Peter Jordan.  1. Please arrive at the [facility] (Main  Entrance A) at [facility]: [address] [place], [place] [address] at 5:30 AM (This time is two hours before your procedure to ensure your preparation). Free valet parking service is available.   Special note: Every effort is made to have your procedure done on time. Please understand that emergencies sometimes delay scheduled procedures.  2. Diet: Do not eat solid foods after midnight.  The patient may have clear liquids until 5am upon the day of the procedure.  3. Labs: You will need to have blood drawn today CBC, BMP  4. Medication instructions in preparation for your procedure:   Contrast Allergy: No  Hold Xarelto 3 days prior to procedure-Starting 02/02/2018  Hold Lisinopril day before and day of procedure    On the morning of your procedure, take your Aspirin and any morning medicines NOT listed above.  You may use sips of water.  5. Plan for one night stay--bring personal belongings. 6. Bring a current list of your medications and current insurance cards. 7. You MUST have a responsible person to drive you home. 8. Someone MUST be with you the first 24 hours after you arrive home or your discharge will be delayed. 9. Please wear clothes that are easy to get on and off and wear slip-on shoes.  Thank you for allowing us to care for you!   -- [facility]       Signed, Victorio Creeden, MD PhD 02/02/2018 2:03 PM    [facility] 

## 2018-02-02 NOTE — Telephone Encounter (Signed)
Pt updated with MD recommendation. Verbalized understanding.

## 2018-02-02 NOTE — Telephone Encounter (Signed)
I would continue the metoprolol for now, but once the amiodarone builds up, we will see if we can decrease the dose at the next visit. Thank you!

## 2018-02-02 NOTE — Patient Instructions (Signed)
Medication Instructions:  Start: Amiodarone 200 mg two times a day for 2 weeks then switch to once a day thereafter.     If you need a refill on your cardiac medications before your next appointment, please call your pharmacy.   Lab work: Your physician recommends that you return for lab work today (CBC, BMP)  If you have labs (blood work) drawn today and your tests are completely normal, you will receive your results only by: Marland Kitchen MyChart Message (if you have MyChart) OR . A paper copy in the mail If you have any lab test that is abnormal or we need to change your treatment, we will call you to review the results.  Testing/Procedures: Your physician has requested that you have a cardiac catheterization. Cardiac catheterization is used to diagnose and/or treat various heart conditions. Doctors may recommend this procedure for a number of different reasons. The most common reason is to evaluate chest pain. Chest pain can be a symptom of coronary artery disease (CAD), and cardiac catheterization can show whether plaque is narrowing or blocking your heart's arteries. This procedure is also used to evaluate the valves, as well as measure the blood flow and oxygen levels in different parts of your heart. For further information please visit [email or web address] Please follow instruction sheet, as given. [facility]    Follow-Up: At [facility], you and your health needs are our priority.  As part of our continuing mission to provide you with exceptional heart care, we have created designated Provider Care Teams.  These Care Teams include your primary Cardiologist (physician) and Advanced Practice Providers (APPs -  Physician Assistants and Nurse Practitioners) who all work together to provide you with the care you need, when you need it. You will need a follow up appointment in 3 weeks.  Please call our office 2 months in advance to schedule this appointment.  You may see [name], MD or one of the following Advanced Practice Providers on your designated Care Team:   Theodore Demark, PA-C . Joni Reining, DNP, ANP      [facility] CARDIOVASCULAR DIVISION [facility] [address] [place] [address] Dept: [phone] Loc: [phone]  Laporcha Cappelletti  02/02/2018  You are scheduled for a Cardiac Catheterization on Tuesday, January 28 with Dr. Peter [place].  1. Please arrive at the [facility] (Main Entrance A) at [facility]: [address] [place], [place] [address] at 5:30 AM (This time is two hours before your procedure to ensure your preparation). Free valet parking service is available.   Special note: Every effort is made to have your procedure done on time. Please understand that emergencies sometimes delay scheduled procedures.  2. Diet: Do not eat solid foods after midnight.  The patient may have clear liquids until 5am upon the day of the procedure.  3. Labs: You will need to have blood drawn today CBC, BMP  4. Medication instructions in preparation for your procedure:   Contrast Allergy: No  Hold Xarelto 3 days prior to procedure-Starting 02/02/2018  Hold Lisinopril day before and day of procedure    On the morning of your procedure, take your Aspirin and any morning medicines NOT listed above.  You may use sips of water.  5. Plan for one night stay--bring personal belongings. 6. Bring a current list of your medications and current insurance cards. 7. You MUST have a responsible person to drive you home. 8. Someone MUST be  with you the first 24 hours after you arrive home or your discharge will be delayed. 9. Please wear clothes that are easy to get on and off and wear slip-on shoes.  Thank you for allowing [place] to care for you!   -- [place] [facility]

## 2018-02-02 NOTE — Telephone Encounter (Signed)
New Message   Patient is calling in reference to her upcoming heart cath and she has some additional questions. Please call to discuss.

## 2018-02-02 NOTE — Telephone Encounter (Signed)
Pt called questioning as to when she is to hold her xarleto and if she is to start taking amiodarone today and hold it the day of Cath procedure. Pt advised to starting holding xarleto on 1/25 and to start amniodarone today. Pt instructed to take it 2 times a day for 2 weeks, then once daily thereafter. Pt also informed that she is ok to take amniodarone on the day of procedure. Pt verbalized understanding.  Pt also questioning as to whether she is to continue taking metoprolol 50 mg TID. Will route to MD for clarification.

## 2018-02-03 DIAGNOSIS — R002 Palpitations: Secondary | ICD-10-CM | POA: Diagnosis not present

## 2018-02-06 ENCOUNTER — Telehealth: Payer: Self-pay | Admitting: *Deleted

## 2018-02-06 NOTE — Telephone Encounter (Addendum)
Pt contacted pre-catheterization scheduled at [facility] for: Tuesday February 07, 2018 7:30 AM Verified arrival time and place: [facility] Main Entrance A at: 5:30 AM  No solid food after midnight prior to cath, clear liquids until 5 AM day of procedure.  Hold: Lisinopril-evening prior to procedure.-GFR 59 Xarelto- last dose 02/03/18 until post procedure.  Except hold medications AM meds can be  taken pre-cath with sip of water including: ASA 81 mg-pt states she can take and tolerate aspirin.  Confirm patient has responsible person to drive home post procedure and for 24 hours after you arrive home: yes

## 2018-02-07 ENCOUNTER — Encounter (HOSPITAL_COMMUNITY)
Admission: RE | Disposition: A | Discharge status: Home / Self Care | Payer: Self-pay | Source: Home / Self Care | Attending: Cardiology

## 2018-02-07 ENCOUNTER — Other Ambulatory Visit: Payer: Self-pay

## 2018-02-07 ENCOUNTER — Ambulatory Visit (HOSPITAL_COMMUNITY)
Admission: RE | Admit: 2018-02-07 | Discharge: 2018-02-07 | Disposition: A | Discharge status: Home / Self Care | Payer: Medicare Other | Attending: Cardiology | Admitting: Cardiology

## 2018-02-07 ENCOUNTER — Encounter (HOSPITAL_COMMUNITY): Payer: Self-pay | Admitting: Cardiology

## 2018-02-07 DIAGNOSIS — Z79899 Other long term (current) drug therapy: Secondary | ICD-10-CM | POA: Insufficient documentation

## 2018-02-07 DIAGNOSIS — Z90710 Acquired absence of both cervix and uterus: Secondary | ICD-10-CM | POA: Diagnosis not present

## 2018-02-07 DIAGNOSIS — I340 Nonrheumatic mitral (valve) insufficiency: Secondary | ICD-10-CM | POA: Diagnosis present

## 2018-02-07 DIAGNOSIS — I081 Rheumatic disorders of both mitral and tricuspid valves: Secondary | ICD-10-CM | POA: Diagnosis not present

## 2018-02-07 DIAGNOSIS — Z888 Allergy status to other drugs, medicaments and biological substances status: Secondary | ICD-10-CM | POA: Diagnosis not present

## 2018-02-07 DIAGNOSIS — G4733 Obstructive sleep apnea (adult) (pediatric): Secondary | ICD-10-CM | POA: Diagnosis not present

## 2018-02-07 DIAGNOSIS — H903 Sensorineural hearing loss, bilateral: Secondary | ICD-10-CM | POA: Insufficient documentation

## 2018-02-07 DIAGNOSIS — M8580 Other specified disorders of bone density and structure, unspecified site: Secondary | ICD-10-CM | POA: Diagnosis not present

## 2018-02-07 DIAGNOSIS — I272 Pulmonary hypertension, unspecified: Secondary | ICD-10-CM | POA: Diagnosis not present

## 2018-02-07 DIAGNOSIS — I482 Chronic atrial fibrillation, unspecified: Secondary | ICD-10-CM | POA: Diagnosis not present

## 2018-02-07 DIAGNOSIS — I10 Essential (primary) hypertension: Secondary | ICD-10-CM | POA: Diagnosis present

## 2018-02-07 DIAGNOSIS — I2510 Atherosclerotic heart disease of native coronary artery without angina pectoris: Secondary | ICD-10-CM | POA: Insufficient documentation

## 2018-02-07 DIAGNOSIS — M1990 Unspecified osteoarthritis, unspecified site: Secondary | ICD-10-CM | POA: Insufficient documentation

## 2018-02-07 DIAGNOSIS — Z91040 Latex allergy status: Secondary | ICD-10-CM | POA: Diagnosis not present

## 2018-02-07 DIAGNOSIS — E785 Hyperlipidemia, unspecified: Secondary | ICD-10-CM | POA: Diagnosis present

## 2018-02-07 DIAGNOSIS — Z823 Family history of stroke: Secondary | ICD-10-CM | POA: Insufficient documentation

## 2018-02-07 DIAGNOSIS — I456 Pre-excitation syndrome: Secondary | ICD-10-CM | POA: Diagnosis not present

## 2018-02-07 DIAGNOSIS — E559 Vitamin D deficiency, unspecified: Secondary | ICD-10-CM | POA: Insufficient documentation

## 2018-02-07 DIAGNOSIS — Z886 Allergy status to analgesic agent status: Secondary | ICD-10-CM | POA: Diagnosis not present

## 2018-02-07 DIAGNOSIS — I429 Cardiomyopathy, unspecified: Secondary | ICD-10-CM

## 2018-02-07 DIAGNOSIS — I420 Dilated cardiomyopathy: Secondary | ICD-10-CM | POA: Diagnosis not present

## 2018-02-07 DIAGNOSIS — I2584 Coronary atherosclerosis due to calcified coronary lesion: Secondary | ICD-10-CM | POA: Diagnosis not present

## 2018-02-07 DIAGNOSIS — I361 Nonrheumatic tricuspid (valve) insufficiency: Secondary | ICD-10-CM | POA: Diagnosis present

## 2018-02-07 DIAGNOSIS — E042 Nontoxic multinodular goiter: Secondary | ICD-10-CM | POA: Insufficient documentation

## 2018-02-07 DIAGNOSIS — Z8249 Family history of ischemic heart disease and other diseases of the circulatory system: Secondary | ICD-10-CM | POA: Insufficient documentation

## 2018-02-07 DIAGNOSIS — I428 Other cardiomyopathies: Secondary | ICD-10-CM

## 2018-02-07 DIAGNOSIS — I110 Hypertensive heart disease with heart failure: Secondary | ICD-10-CM | POA: Insufficient documentation

## 2018-02-07 DIAGNOSIS — I509 Heart failure, unspecified: Secondary | ICD-10-CM | POA: Diagnosis not present

## 2018-02-07 HISTORY — PX: RIGHT/LEFT HEART CATH AND CORONARY ANGIOGRAPHY: CATH118266

## 2018-02-07 LAB — POCT I-STAT 7, (LYTES, BLD GAS, ICA,H+H)
Bicarbonate: 25.2 mmol/L (ref 20.0–28.0)
Calcium, Ion: 1.2 mmol/L (ref 1.15–1.40)
HCT: 35 % — ABNORMAL LOW (ref 36.0–46.0)
Hemoglobin: 11.9 g/dL — ABNORMAL LOW (ref 12.0–15.0)
O2 Saturation: 97 %
Potassium: 3.8 mmol/L (ref 3.5–5.1)
Sodium: 138 mmol/L (ref 135–145)
TCO2: 26 mmol/L (ref 22–32)
pCO2 arterial: 40.1 mmHg (ref 32.0–48.0)
pH, Arterial: 7.406 (ref 7.350–7.450)
pO2, Arterial: 96 mmHg (ref 83.0–108.0)

## 2018-02-07 LAB — POCT I-STAT EG7
Acid-Base Excess: 2 mmol/L (ref 0.0–2.0)
Bicarbonate: 27.9 mmol/L (ref 20.0–28.0)
Calcium, Ion: 1.2 mmol/L (ref 1.15–1.40)
HCT: 36 % (ref 36.0–46.0)
Hemoglobin: 12.2 g/dL (ref 12.0–15.0)
O2 Saturation: 60 %
Potassium: 3.7 mmol/L (ref 3.5–5.1)
Sodium: 139 mmol/L (ref 135–145)
TCO2: 29 mmol/L (ref 22–32)
pCO2, Ven: 48.9 mmHg (ref 44.0–60.0)
pH, Ven: 7.364 (ref 7.250–7.430)
pO2, Ven: 33 mmHg (ref 32.0–45.0)

## 2018-02-07 SURGERY — RIGHT/LEFT HEART CATH AND CORONARY ANGIOGRAPHY
Anesthesia: LOCAL

## 2018-02-07 MED ORDER — ONDANSETRON HCL 4 MG/2ML IJ SOLN: 4.0000 mg | Freq: Four times a day (QID) | INTRAMUSCULAR | Status: DC | PRN

## 2018-02-07 MED ORDER — VERAPAMIL HCL 2.5 MG/ML IV SOLN
INTRAVENOUS | Status: AC
  Filled 2018-02-07: qty 4

## 2018-02-07 MED ORDER — VERAPAMIL HCL 2.5 MG/ML IV SOLN
INTRAVENOUS | Status: DC | PRN
  Administered 2018-02-07: 10 mL via INTRA_ARTERIAL

## 2018-02-07 MED ORDER — SODIUM CHLORIDE 0.9% FLUSH: 3.0000 mL | Freq: Two times a day (BID) | INTRAVENOUS | Status: DC

## 2018-02-07 MED ORDER — FENTANYL CITRATE (PF) 100 MCG/2ML IJ SOLN
INTRAMUSCULAR | Status: AC
  Filled 2018-02-07: qty 2

## 2018-02-07 MED ORDER — ACETAMINOPHEN 325 MG PO TABS: 650.0000 mg | ORAL_TABLET | ORAL | Status: DC | PRN

## 2018-02-07 MED ORDER — ASPIRIN 81 MG PO CHEW: 81.0000 mg | CHEWABLE_TABLET | ORAL | Status: DC

## 2018-02-07 MED ORDER — SODIUM CHLORIDE 0.9% FLUSH: 3.0000 mL | INTRAVENOUS | Status: DC | PRN

## 2018-02-07 MED ORDER — MIDAZOLAM HCL 2 MG/2ML IJ SOLN
INTRAMUSCULAR | Status: AC
  Filled 2018-02-07: qty 2

## 2018-02-07 MED ORDER — RIVAROXABAN 20 MG PO TABS: ORAL_TABLET | ORAL | 1 refills | Status: DC

## 2018-02-07 MED ORDER — IOHEXOL 350 MG/ML SOLN
INTRAVENOUS | Status: DC | PRN
  Administered 2018-02-07: 50 mL via INTRA_ARTERIAL

## 2018-02-07 MED ORDER — LIDOCAINE HCL (PF) 1 % IJ SOLN
INTRAMUSCULAR | Status: AC
  Filled 2018-02-07: qty 30

## 2018-02-07 MED ORDER — LIDOCAINE HCL (PF) 1 % IJ SOLN
INTRAMUSCULAR | Status: DC | PRN
  Administered 2018-02-07 (×2): 2 mL via SUBCUTANEOUS

## 2018-02-07 MED ORDER — SODIUM CHLORIDE 0.9 % IV SOLN
INTRAVENOUS | Status: DC
  Administered 2018-02-07: 06:00:00 via INTRAVENOUS

## 2018-02-07 MED ORDER — FENTANYL CITRATE (PF) 100 MCG/2ML IJ SOLN
INTRAMUSCULAR | Status: DC | PRN
  Administered 2018-02-07 (×2): 25 ug via INTRAVENOUS

## 2018-02-07 MED ORDER — HEPARIN SODIUM (PORCINE) 1000 UNIT/ML IJ SOLN
INTRAMUSCULAR | Status: AC
  Filled 2018-02-07: qty 1

## 2018-02-07 MED ORDER — HEPARIN (PORCINE) IN NACL 1000-0.9 UT/500ML-% IV SOLN
INTRAVENOUS | Status: AC
  Filled 2018-02-07: qty 1000

## 2018-02-07 MED ORDER — SODIUM CHLORIDE 0.9 % WEIGHT BASED INFUSION: 1.0000 mL/kg/h | INTRAVENOUS | Status: AC

## 2018-02-07 MED ORDER — SODIUM CHLORIDE 0.9 % IV SOLN: 250.0000 mL | INTRAVENOUS | Status: DC | PRN

## 2018-02-07 MED ORDER — HEPARIN SODIUM (PORCINE) 1000 UNIT/ML IJ SOLN
INTRAMUSCULAR | Status: DC | PRN
  Administered 2018-02-07: 3500 [IU] via INTRAVENOUS

## 2018-02-07 MED ORDER — MIDAZOLAM HCL 2 MG/2ML IJ SOLN
INTRAMUSCULAR | Status: DC | PRN
  Administered 2018-02-07 (×2): 1 mg via INTRAVENOUS

## 2018-02-07 MED ORDER — HEPARIN (PORCINE) IN NACL 1000-0.9 UT/500ML-% IV SOLN
INTRAVENOUS | Status: DC | PRN
  Administered 2018-02-07 (×2): 500 mL

## 2018-02-07 SURGICAL SUPPLY — 13 items
CATH 5FR JL3.5 JR4 ANG PIG MP (CATHETERS) ×2 IMPLANT
CATH BALLN WEDGE 5F 110CM (CATHETERS) ×2 IMPLANT
DEVICE RAD COMP TR BAND LRG (VASCULAR PRODUCTS) ×2 IMPLANT
GLIDESHEATH SLEND SS 6F .021 (SHEATH) ×2 IMPLANT
GUIDEWIRE .025 260CM (WIRE) ×2 IMPLANT
GUIDEWIRE INQWIRE 1.5J.035X260 (WIRE) ×1 IMPLANT
INQWIRE 1.5J .035X260CM (WIRE) ×2
KIT HEART LEFT (KITS) ×2 IMPLANT
PACK CARDIAC CATHETERIZATION (CUSTOM PROCEDURE TRAY) ×2 IMPLANT
SHEATH GLIDE SLENDER 4/5FR (SHEATH) ×2 IMPLANT
SYR MEDRAD MARK 7 150ML (SYRINGE) ×2 IMPLANT
TRANSDUCER W/STOPCOCK (MISCELLANEOUS) ×2 IMPLANT
TUBING CIL FLEX 10 FLL-RA (TUBING) ×2 IMPLANT

## 2018-02-07 NOTE — Interval H&P Note (Signed)
History and Physical Interval Note:  02/07/2018 7:05 AM  Jennifer Mcclain  has presented today for surgery, with the diagnosis of reduced EF  The various methods of treatment have been discussed with the patient and family. After consideration of risks, benefits and other options for treatment, the patient has consented to  Procedure(s): RIGHT/LEFT HEART CATH AND CORONARY ANGIOGRAPHY (N/A) as a surgical intervention .  The patient's history has been reviewed, patient examined, no change in status, stable for surgery.  I have reviewed the patient's chart and labs.  Questions were answered to the patient's satisfaction.     Theron [facility] 02/07/2018 7:05 AM

## 2018-02-07 NOTE — Discharge Instructions (Signed)
Radial Site Care ° °This sheet gives you information about how to care for yourself after your procedure. Your health care provider may also give you more specific instructions. If you have problems or questions, contact your health care provider. °What can I expect after the procedure? °After the procedure, it is common to have: °· Bruising and tenderness at the catheter insertion area. °Follow these instructions at home: °Medicines °· Take over-the-counter and prescription medicines only as told by your health care provider. °Insertion site care °· Follow instructions from your health care provider about how to take care of your insertion site. Make sure you: °? Wash your hands with soap and water before you change your bandage (dressing). If soap and water are not available, use hand sanitizer. °? Change your dressing as told by your health care provider. °? Leave stitches (sutures), skin glue, or adhesive strips in place. These skin closures may need to stay in place for 2 weeks or longer. If adhesive strip edges start to loosen and curl up, you may trim the loose edges. Do not remove adhesive strips completely unless your health care provider tells you to do that. °· Check your insertion site every day for signs of infection. Check for: °? Redness, swelling, or pain. °? Fluid or blood. °? Pus or a bad smell. °? Warmth. °· Do not take baths, swim, or use a hot tub until your health care provider approves. °· You may shower 24-48 hours after the procedure, or as directed by your health care provider. °? Remove the dressing and gently wash the site with plain soap and water. °? Pat the area dry with a clean towel. °? Do not rub the site. That could cause bleeding. °· Do not apply powder or lotion to the site. °Activity ° °· For 24 hours after the procedure, or as directed by your health care provider: °? Do not flex or bend the affected arm. °? Do not push or pull heavy objects with the affected arm. °? Do not  drive yourself home from the hospital or clinic. You may drive 24 hours after the procedure unless your health care provider tells you not to. °? Do not operate machinery or power tools. °· Do not lift anything that is heavier than 10 lb (4.5 kg), or the limit that you are told, until your health care provider says that it is safe. °· Ask your health care provider when it is okay to: °? Return to work or school. °? Resume usual physical activities or sports. °? Resume sexual activity. °General instructions °· If the catheter site starts to bleed, raise your arm and put firm pressure on the site. If the bleeding does not stop, get help right away. This is a medical emergency. °· If you went home on the same day as your procedure, a responsible adult should be with you for the first 24 hours after you arrive home. °· Keep all follow-up visits as told by your health care provider. This is important. °Contact a health care provider if: °· You have a fever. °· You have redness, swelling, or yellow drainage around your insertion site. °Get help right away if: °· You have unusual pain at the radial site. °· The catheter insertion area swells very fast. °· The insertion area is bleeding, and the bleeding does not stop when you hold steady pressure on the area. °· Your arm or hand becomes pale, cool, tingly, or numb. °These symptoms may represent a serious problem   that is an emergency. Do not wait to see if the symptoms will go away. Get medical help right away. Call your local emergency services (911 in the [place] Do not drive yourself to the hospital. °Summary °· After the procedure, it is common to have bruising and tenderness at the site. °· Follow instructions from your health care provider about how to take care of your radial site wound. Check the wound every day for signs of infection. °· Do not lift anything that is heavier than 10 lb (4.5 kg), or the limit that you are told, until your health care provider says  that it is safe. °This information is not intended to replace advice given to you by your health care provider. Make sure you discuss any questions you have with your health care provider. °Document Released: 01/30/2010 Document Revised: 02/02/2017 Document Reviewed: 02/02/2017 °Elsevier Interactive Patient Education © 2019 Elsevier Inc. ° °

## 2018-02-07 NOTE — Progress Notes (Addendum)
On arrival assessment pt c/o visual disturbances. C/o floaters and flashing orbit in the right eye, denies pain denies prior migraine hx. Pa Jill for cardiology was paged. Fluids at ordered rate and encouraging fluids. Noreene Larsson PA was informed, no further orders at this time, will continue to monitor and call w/ further needs.  0945 all symptoms resolved.

## 2018-02-10 ENCOUNTER — Ambulatory Visit: Payer: Medicare Other | Admitting: Nurse Practitioner

## 2018-02-10 ENCOUNTER — Encounter: Payer: Medicare Other | Admitting: Nurse Practitioner

## 2018-02-10 ENCOUNTER — Ambulatory Visit: Payer: Medicare Other

## 2018-02-14 ENCOUNTER — Telehealth: Payer: Self-pay | Admitting: Cardiology

## 2018-02-14 NOTE — Telephone Encounter (Signed)
New message     Pt calling back to discuss results for heart monitor

## 2018-02-15 ENCOUNTER — Ambulatory Visit (INDEPENDENT_AMBULATORY_CARE_PROVIDER_SITE_OTHER): Payer: Medicare Other | Admitting: Nurse Practitioner

## 2018-02-15 ENCOUNTER — Encounter: Payer: Self-pay | Admitting: Nurse Practitioner

## 2018-02-15 VITALS — BP 128/76 | HR 81 | Temp 97.8°F | Resp 10 | Ht 61.0 in | Wt 192.0 lb

## 2018-02-15 DIAGNOSIS — I482 Chronic atrial fibrillation, unspecified: Secondary | ICD-10-CM

## 2018-02-15 DIAGNOSIS — Z1231 Encounter for screening mammogram for malignant neoplasm of breast: Secondary | ICD-10-CM

## 2018-02-15 DIAGNOSIS — M159 Polyosteoarthritis, unspecified: Secondary | ICD-10-CM

## 2018-02-15 DIAGNOSIS — E042 Nontoxic multinodular goiter: Secondary | ICD-10-CM | POA: Diagnosis not present

## 2018-02-15 DIAGNOSIS — Z1239 Encounter for other screening for malignant neoplasm of breast: Secondary | ICD-10-CM | POA: Diagnosis not present

## 2018-02-15 DIAGNOSIS — M8949 Other hypertrophic osteoarthropathy, multiple sites: Secondary | ICD-10-CM

## 2018-02-15 DIAGNOSIS — I10 Essential (primary) hypertension: Secondary | ICD-10-CM | POA: Diagnosis not present

## 2018-02-15 DIAGNOSIS — Z0000 Encounter for general adult medical examination without abnormal findings: Secondary | ICD-10-CM | POA: Diagnosis not present

## 2018-02-15 DIAGNOSIS — R0981 Nasal congestion: Secondary | ICD-10-CM

## 2018-02-15 DIAGNOSIS — M150 Primary generalized (osteo)arthritis: Secondary | ICD-10-CM | POA: Diagnosis not present

## 2018-02-15 DIAGNOSIS — M810 Age-related osteoporosis without current pathological fracture: Secondary | ICD-10-CM | POA: Diagnosis not present

## 2018-02-15 DIAGNOSIS — E785 Hyperlipidemia, unspecified: Secondary | ICD-10-CM

## 2018-02-15 MED ORDER — TETANUS-DIPHTH-ACELL PERTUSSIS 5-2.5-18.5 LF-MCG/0.5 IM SUSP: 0.5000 mL | Freq: Once | INTRAMUSCULAR | 0 refills | Status: DC

## 2018-02-15 MED ORDER — TETANUS-DIPHTHERIA TOXOIDS TD 5-2 LFU IM INJ: 0.5000 mL | INJECTION | Freq: Once | INTRAMUSCULAR | 0 refills | Status: AC

## 2018-02-15 NOTE — Patient Instructions (Addendum)
Dr Myra Gianotti Marrow- DENTIST  [address]  [place] [address]  [phone]    [facility] Located in: [facility] Address: [address] [place], [place], [place] [address] Phone: [phone]  [facility] in [place] Address: [address] [place], [place], [place] [address] Phone: [phone]  Avoid afrin To use flonase 1 spary into each nare daily as needed congestion neti pot twice daily Plain nasal saline spray throughout the day as needed May use tylenol 325 mg 2 tablets every 6 hours as needed aches and pains or sore throat humidifier in the home to help with the dry air Mucinex DM by mouth twice daily as needed for cough and chest congestion with full glass of water  Keep well hydrated Avoid forcefully blowing nose

## 2018-02-15 NOTE — Progress Notes (Signed)
Subjective:   Jennifer Mcclain is a 73 y.o. female who presents for Medicare Annual (Subsequent) preventive examination.  Review of Systems:   Cardiac Risk Factors include: advanced age (>4255men, 20>65 women);obesity (BMI >30kg/m2);dyslipidemia;hypertension;sedentary lifestyle     Objective:     Vitals: BP 128/76   Pulse 81   Temp 97.8 F (36.6 C) (Oral)   Resp 10   Ht 5\' 1"  (1.549 m)   Wt 192 lb (87.1 kg)   SpO2 95%   BMI 36.28 kg/m   Body mass index is 36.28 kg/m.  Advanced Directives 02/15/2018 02/07/2018 12/07/2017  Does Patient Have a Medical Advance Directive? Yes Yes No  Type of Estate agentAdvance Directive Healthcare Power of [place]ila[place]orney;Living will Healthcare Power of [place]orney;Living will -  Does patient want to make changes to medical advance directive? - No - Patient declined -  Copy of Healthcare Power of Attorney in Chart? No - copy requested No - copy requested -    Tobacco Social History   Tobacco Use  Smoking Status Never Smoker  Smokeless Tobacco Never Used     Counseling given: Not Answered   Clinical Intake:  Pre-visit preparation completed: Yes  Pain : 0-10 Pain Score: 1  Pain Type: Acute pain Pain Location: Arm Pain Orientation: Right Pain Radiating Towards: pain starting in wrist and radiation towards shoulder after cath Pain Descriptors / Indicators: Burning Pain Onset: In the past 7 days Pain Frequency: Rarely     BMI - recorded: 36.28 Nutritional Status: BMI > 30  Obese Diabetes: No  How often do you need to have someone help you when you read instructions, pamphlets, or other written materials from your doctor or pharmacy?: 1 - Never What is the last grade level you completed in school?: bachlors degree   Interpreter Needed?: No     Past Medical History:  Diagnosis Date  . Achilles tendinitis    [facility]   . Atrial fibrillation [facility](HCC)    Per records from [facility] in [place], [place]   . Baker's cyst of knee,  left    with resulting lower extremity edema  . Benign cyst of breast, left    Per records from [facility] in [place], [place]. Has yearly U/S in the past   . Cerebrovascular disease    [facility]   . Chronic atrial fibrillation    [facility]   . Chronic atrial fibrillation    Evalina Fieldichard Miller, MD  . Decreased hearing, bilateral    referred to audiologist, Per records [facility] in [place], [place]   . Disorder of bone density and structure, unspecified    Per [facility] in [place], [place]   . Disorder of carotid artery (HCC)    Lynden AngMelissa James, MD  . Dyslipidemia    Evalina Fieldichard Miller, MD  . Elevated alkaline phosphatase level    Neg ative ANA 08/2013. Per records from [facility] in [place], [place]   . H/O calculus of kidney during pregnancy    Lynden AngMelissa James, MD  . H/O hysterectomy for benign disease    Lynden AngMelissa James, MD  . History of fibula fracture    Right, Per records from [facility] in [place], [place]   . History of torn meniscus of right knee    Per records from [facility] in [place], [place]   . Humerus fracture    left leg- due to fall  .  Humerus fracture    Resulting from a fall, Per records from [facility] in [place], [place]   . Hypercalcemia    Per records from [facility] in [place], [place]   . Hypertension    Per [facility] New Patient packet   . Kidney stone on left side    Per records from [facility] in [place], [place]   . Lactose intolerance    Per [facility] New Patient packet   . Multiple actinic keratoses    Lynden Ang, MD  . Myalgia due to statin    Per records from [facility] in [place], [place]. Due to crestor   . 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 Normocytic anemia    Lynden Ang, MD  . Obstructive sleep apnea syndrome    Per records from [facility] in [place], [place]. Patient did  not tolerate CPAP machine   . Onychomycosis    Per records from [facility] in [place], [place]   . Osteoarthritis    Per [facility] New Patient packet   . Osteopenia    Per records from [facility] in [place], [place]. Treated in the past with antiresorptive therapy  . Renal cyst 10/11/2012   Simple on CT Scan 10/2012. Per records from [facility] in [place], [place]   . 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 Umbilical hernia    Per [facility] New Patient packet   . Vitamin D deficiency    Lynden Ang, MD   Past Surgical History:  Procedure Laterality Date  . ABDOMINAL HYSTERECTOMY  1989   One ovary, Per [facility] New Patient packet   . ABLATION  1996   Radical, Per [facility] New Patient packet   . COLONOSCOPY  2019   Dr.Melissa Fayrene Fearing, Per [facility] New Patient packet   . KNEE ARTHROSCOPY W/ MENISCAL REPAIR  2002   Per [facility] New Patient packet   . REPLACEMENT TOTAL KNEE Left 2015   Per [facility] New Patient packet  . RIGHT/LEFT HEART CATH AND CORONARY ANGIOGRAPHY N/A 02/07/2018   Procedure: RIGHT/LEFT HEART CATH AND CORONARY ANGIOGRAPHY;  Surgeon: Swaziland, Peter M, MD;  Location: [facility] INVASIVE CV LAB;  Service: Cardiovascular;  Laterality: N/A;  . TONSILLECTOMY  1951   Per [facility] New Patient packet    Family History  Problem Relation Age of Onset  . Congestive Heart Failure Mother   . Diabetes Mother 66  . Arthritis Mother 16  . Stroke Father   . Dementia Father   . Diabetes Father   . Hyperlipidemia Father   . Alzheimer's disease Father 38  . Diabetes Sister   . Cancer Maternal Grandfather   . Diabetes Maternal Grandfather 60  . CVA Maternal Grandfather   . AAA (abdominal aortic aneurysm) Sister   . Congenital heart disease Daughter   . Heart attack Maternal Aunt   . Lung cancer Maternal Grandmother 80  . Breast cancer Paternal Aunt    Social History   Socioeconomic History  . Marital status: Married    Spouse name: Not on file  . Number  of children: Not on file  . Years of education: Not on file  . Highest education level: Not on file  Occupational History  . Not on file  Social Needs  . Financial resource strain: Not on file  . Food insecurity:    Worry: Not on file    Inability: Not on file  .  Transportation needs:    Medical: Not on file    Non-medical: Not on file  Tobacco Use  . Smoking status: Never Smoker  . Smokeless tobacco: Never Used  Substance and Sexual Activity  . Alcohol use: Yes    Comment: 1-2 drinks weekly - social   . Drug use: Never  . Sexual activity: Not on file  Lifestyle  . Physical activity:    Days per week: Not on file    Minutes per session: Not on file  . Stress: Not on file  Relationships  . Social connections:    Talks on phone: Not on file    Gets together: Not on file    Attends religious service: Not on file    Active member of club or organization: Not on file    Attends meetings of clubs or organizations: Not on file    Relationship status: Not on file  Other Topics Concern  . 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 No   Do you have difficulty preparing food or eating? No   Do you have difficulty managing your medications? No   Do you have difficulty managing your finances? No   Do you have difficulty affording your medications? No    Outpatient Encounter Medications as of 02/15/2018  Medication Sig  . acetaminophen  (TYLENOL) 650 MG CR tablet Take 650 mg by mouth 2 (two) times daily.   Marland Kitchen amiodarone (PACERONE) 200 MG tablet Take 1 tablet (200 mg total) by mouth 2 (two) times daily. For two weeks  . Calcium Carb-Cholecalciferol (CALCIUM 600/VITAMIN D3 PO) Take 1 tablet by mouth 2 (two) times daily.  . Cholecalciferol (VITAMIN D3) 25 MCG (1000 UT) CAPS Take 1,000 Units by mouth daily.   Marland Kitchen lisinopril (PRINIVIL,ZESTRIL) 20 MG tablet Take 1 tablet (20 mg total) by mouth daily.  . metoprolol succinate (TOPROL-XL) 50 MG 24 hr tablet Take 50 mg by mouth 2 (two) times daily. Take with or immediately following a meal.  . Multiple Vitamins-Minerals (OCUVITE PO) Take 1 tablet by mouth daily.   . Oxymetazoline HCl (SINEX ULTRA FINE MIST 12-HOUR NA) Place 1 spray into both nostrils daily.   . polycarbophil (FIBERCON) 625 MG tablet Take 1,250 mg by mouth daily.  . Probiotic Product (PHILLIPS COLON HEALTH PO) Take 1 capsule by mouth daily.   . rivaroxaban (XARELTO) 20 MG TABS tablet TAKE ONE TABLET BY MOUTH DAILY WITH SUPPER  . rosuvastatin (CRESTOR) 5 MG tablet Take 5 mg by mouth 3 (three) times a week.  . [DISCONTINUED] metoprolol succinate (TOPROL-XL) 50 MG 24 hr tablet Take 3 tablets (150 mg total) by mouth daily. Take with or immediately following a meal. (Patient taking differently: Take 50-100 mg by  mouth See admin instructions. Take 100 mg by mouth in the morning and take 50 mg by mouth in the evening)  . amiodarone (PACERONE) 200 MG tablet Take 1 tablet (200 mg total) by mouth daily. (Patient not taking: Reported on 02/15/2018)   No facility-administered encounter medications on file as of 02/15/2018.     Activities of Daily Living In your present state of health, do you have any difficulty performing the following activities: 02/15/2018  Hearing? Y  Vision? N  Difficulty concentrating or making decisions? N  Walking or climbing stairs? Y  Comment due to knee pain and a fib  Dressing or bathing? N  Doing errands,  shopping? N  Preparing Food and eating ? N  Using the Toilet? N  In the past six months, have you accidently leaked urine? Y  Do you have problems with loss of bowel control? N  Managing your Medications? N  Managing your Finances? N  Housekeeping or managing your Housekeeping? N    Patient Care Team: Sharon SellerEubanks, Jessica K, NP as PCP - General (Geriatric Medicine) Jodelle Redhristopher, Bridgette, MD as PCP - Cardiology (Cardiology)    Assessment:   This is a routine wellness examination for Harika.  Exercise Activities and Dietary recommendations Current Exercise Habits: The patient does not participate in regular exercise at present, Exercise limited by: cardiac condition(s);orthopedic condition(s)  Goals    . Exercise 11220min/wk light-Moderate Activity     Plans to start exercise (plans to discuss with cardiologist)    . Weight (lb) < 140 lb (63.5 kg)       Fall Risk Fall Risk  02/15/2018 12/07/2017  Falls in the past year? 0 1  Number falls in past yr: 0 0  Injury with Fall? 0 1  Risk for fall due to : - Other (Comment)  Risk for fall due to: Comment - tripped over box of decorations last December   Is the patient's home free of loose throw rugs in walkways, pet beds, electrical cords, etc?   Yes       Grab bars in the bathroom? no      Handrails on the stairs?   yes      Adequate lighting?   yes  Timed Get Up and Go performed: n/a  Depression Screen PHQ 2/9 Scores 02/15/2018 12/07/2017  PHQ - 2 Score 0 0     Cognitive Function MMSE - Mini Mental State Exam 02/15/2018  Orientation to time 5  Orientation to Place 5  Registration 3  Attention/ Calculation 5  Recall 3  Language- name 2 objects 2  Language- repeat 1  Language- follow 3 step command 1  Language- read & follow direction 1  Copy design 1        Immunization History  Administered Date(s) Administered  . Influenza, High Dose Seasonal PF 10/31/2017  . Influenza, Quadrivalent, Recombinant, Inj, Pf 10/11/2016    . Pneumococcal Conjugate-13 11/20/2013  . Pneumococcal Polysaccharide-23 11/13/2012  . Tdap 11/27/2007  . Zoster 05/08/2016    Qualifies for Shingles Vaccine? Yes, has had shringrix   Screening Tests Health Maintenance  Topic Date Due  . Hepatitis C Screening  09/10/1945  . MAMMOGRAM  08/08/1997  . TETANUS/TDAP  11/26/2017  . COLONOSCOPY  01/11/2018  . INFLUENZA VACCINE  Completed  . DEXA SCAN  Completed  . PNA vac Low Risk Adult  Completed    Cancer Screenings: Lung: Low Dose CT Chest recommended if Age 16-80 years, 30 pack-year currently smoking OR have quit  w/in 15years. Patient does not qualify. Breast:  Up to date on Mammogram? Yes   Up to date of Bone Density/Dexa? Yes Colorectal: had cologuard summer 2019  Additional Screenings:  Hepatitis C Screening: unknown.      Plan:     I have personally reviewed and noted the following in the patient's chart:   . Medical and social history . Use of alcohol, tobacco or illicit drugs  . Current medications and supplements . Functional ability and status . Nutritional status . Physical activity . Advanced directives . List of other physicians . Hospitalizations, surgeries, and ER visits in previous 12 months . Vitals . Screenings to include cognitive, depression, and falls . Referrals and appointments  In addition, I have reviewed and discussed with patient certain preventive protocols, quality metrics, and best practice recommendations. A written personalized care plan for preventive services as well as general preventive health recommendations were provided to patient.     [name], NP  02/15/2018

## 2018-02-15 NOTE — Addendum Note (Signed)
Addended by: Asher Muir A on: 02/15/2018 02:39 PM   Modules accepted: Orders

## 2018-02-15 NOTE — Patient Instructions (Signed)
Jennifer Mcclain , Thank you for taking time to come for your Medicare Wellness Visit. I appreciate your ongoing commitment to your health goals. Please review the following plan we discussed and let me know if I can assist you in the future.   Screening recommendations/referrals: Colonoscopy - up to date on cologuard Mammogram- need appt, referral sent Bone Density  Up to date Recommended yearly ophthalmology/optometry visit for glaucoma screening and checkup Recommended yearly dental visit for hygiene and checkup  Vaccinations: Influenza vaccine -up to date Pneumococcal vaccine up to date Tdap vaccine- due for TD Shingles vaccine -up to date    Advanced directives: fill out and bring to next office visit  Conditions/risks identified: increase activity recommended and weight loss   Next appointment: 1 year for AWV    Preventive Care 65 Years and Older, Female Preventive care refers to lifestyle choices and visits with your health care provider that can promote health and wellness. What does preventive care include?  A yearly physical exam. This is also called an annual well check.  Dental exams once or twice a year.  Routine eye exams. Ask your health care provider how often you should have your eyes checked.  Personal lifestyle choices, including:  Daily care of your teeth and gums.  Regular physical activity.  Eating a healthy diet.  Avoiding tobacco and drug use.  Limiting alcohol use.  Practicing safe sex.  Taking low-dose aspirin every day.  Taking vitamin and mineral supplements as recommended by your health care provider. What happens during an annual well check? The services and screenings done by your health care provider during your annual well check will depend on your age, overall health, lifestyle risk factors, and family history of disease. Counseling  Your health care provider may ask you questions about your:  Alcohol use.  Tobacco use.  Drug  use.  Emotional well-being.  Home and relationship well-being.  Sexual activity.  Eating habits.  History of falls.  Memory and ability to understand (cognition).  Work and work Astronomer.  Reproductive health. Screening  You may have the following tests or measurements:  Height, weight, and BMI.  Blood pressure.  Lipid and cholesterol levels. These may be checked every 5 years, or more frequently if you are over 35 years old.  Skin check.  Lung cancer screening. You may have this screening every year starting at age 55 if you have a 30-pack-year history of smoking and currently smoke or have quit within the past 15 years.  Fecal occult blood test (FOBT) of the stool. You may have this test every year starting at age 63.  Flexible sigmoidoscopy or colonoscopy. You may have a sigmoidoscopy every 5 years or a colonoscopy every 10 years starting at age 27.  Hepatitis C blood test.  Hepatitis B blood test.  Sexually transmitted disease (STD) testing.  Diabetes screening. This is done by checking your blood sugar (glucose) after you have not eaten for a while (fasting). You may have this done every 1-3 years.  Bone density scan. This is done to screen for osteoporosis. You may have this done starting at age 6.  Mammogram. This may be done every 1-2 years. Talk to your health care provider about how often you should have regular mammograms. Talk with your health care provider about your test results, treatment options, and if necessary, the need for more tests. Vaccines  Your health care provider may recommend certain vaccines, such as:  Influenza vaccine. This is recommended every  year.  Tetanus, diphtheria, and acellular pertussis (Tdap, Td) vaccine. You may need a Td booster every 10 years.  Zoster vaccine. You may need this after age 78.  Pneumococcal 13-valent conjugate (PCV13) vaccine. One dose is recommended after age 28.  Pneumococcal polysaccharide  (PPSV23) vaccine. One dose is recommended after age 43. Talk to your health care provider about which screenings and vaccines you need and how often you need them. This information is not intended to replace advice given to you by your health care provider. Make sure you discuss any questions you have with your health care provider. Document Released: 01/24/2015 Document Revised: 09/17/2015 Document Reviewed: 10/29/2014 Elsevier Interactive Patient Education  2017 [employer] Prevention in the Home Falls can cause injuries. They can happen to people of all ages. There are many things you can do to make your home safe and to help prevent falls. What can I do on the outside of my home?  Regularly fix the edges of walkways and driveways and fix any cracks.  Remove anything that might make you trip as you walk through a door, such as a raised step or threshold.  Trim any bushes or trees on the path to your home.  Use bright outdoor lighting.  Clear any walking paths of anything that might make someone trip, such as rocks or tools.  Regularly check to see if handrails are loose or broken. Make sure that both sides of any steps have handrails.  Any raised decks and porches should have guardrails on the edges.  Have any leaves, snow, or ice cleared regularly.  Use sand or salt on walking paths during winter.  Clean up any spills in your garage right away. This includes oil or grease spills. What can I do in the bathroom?  Use night lights.  Install grab bars by the toilet and in the tub and shower. Do not use towel bars as grab bars.  Use non-skid mats or decals in the tub or shower.  If you need to sit down in the shower, use a plastic, non-slip stool.  Keep the floor dry. Clean up any water that spills on the floor as soon as it happens.  Remove soap buildup in the tub or shower regularly.  Attach bath mats securely with double-sided non-slip rug tape.  Do not have  throw rugs and other things on the floor that can make you trip. What can I do in the bedroom?  Use night lights.  Make sure that you have a light by your bed that is easy to reach.  Do not use any sheets or blankets that are too big for your bed. They should not hang down onto the floor.  Have a firm chair that has side arms. You can use this for support while you get dressed.  Do not have throw rugs and other things on the floor that can make you trip. What can I do in the kitchen?  Clean up any spills right away.  Avoid walking on wet floors.  Keep items that you use a lot in easy-to-reach places.  If you need to reach something above you, use a strong step stool that has a grab bar.  Keep electrical cords out of the way.  Do not use floor polish or wax that makes floors slippery. If you must use wax, use non-skid floor wax.  Do not have throw rugs and other things on the floor that can make you trip. What can  I do with my stairs?  Do not leave any items on the stairs.  Make sure that there are handrails on both sides of the stairs and use them. Fix handrails that are broken or loose. Make sure that handrails are as long as the stairways.  Check any carpeting to make sure that it is firmly attached to the stairs. Fix any carpet that is loose or worn.  Avoid having throw rugs at the top or bottom of the stairs. If you do have throw rugs, attach them to the floor with carpet tape.  Make sure that you have a light switch at the top of the stairs and the bottom of the stairs. If you do not have them, ask someone to add them for you. What else can I do to help prevent falls?  Wear shoes that:  Do not have high heels.  Have rubber bottoms.  Are comfortable and fit you well.  Are closed at the toe. Do not wear sandals.  If you use a stepladder:  Make sure that it is fully opened. Do not climb a closed stepladder.  Make sure that both sides of the stepladder are  locked into place.  Ask someone to hold it for you, if possible.  Clearly mark and make sure that you can see:  Any grab bars or handrails.  First and last steps.  Where the edge of each step is.  Use tools that help you move around (mobility aids) if they are needed. These include:  Canes.  Walkers.  Scooters.  Crutches.  Turn on the lights when you go into a dark area. Replace any light bulbs as soon as they burn out.  Set up your furniture so you have a clear path. Avoid moving your furniture around.  If any of your floors are uneven, fix them.  If there are any pets around you, be aware of where they are.  Review your medicines with your doctor. Some medicines can make you feel dizzy. This can increase your chance of falling. Ask your doctor what other things that you can do to help prevent falls. This information is not intended to replace advice given to you by your health care provider. Make sure you discuss any questions you have with your health care provider. Document Released: 10/24/2008 Document Revised: 06/05/2015 Document Reviewed: 02/01/2014 Elsevier Interactive Patient Education  2017 [employer].

## 2018-02-15 NOTE — Progress Notes (Signed)
Provider: Sharon SellerEubanks, Nayshawn Mesta K, NP  Patient Care Team: Sharon SellerEubanks, Joclynn Lumb K, NP as PCP - General (Geriatric Medicine) Jodelle Redhristopher, Bridgette, MD as PCP - Cardiology (Cardiology)  Extended Emergency Contact Information Primary Emergency Contact: Buening,stephen Mobile Phone: [phone] Relation: Spouse Interpreter needed? No Allergies  Allergen Reactions  . Adhesive [Tape]     Skin irritation   . Grass Extracts [Gramineae Pollens]      Per records from from [facility]  . Lactose Intolerance (Gi) Other (See Comments)    Unknown  . Naprosyn [Naproxen] Hives  . Coq10 [Coenzyme Q10] Other (See Comments)    Cramps   . Latex Rash  . Zocor [Simvastatin] Other (See Comments)    cramps   Code Status: FULL Goals of Care: Advanced Directive information Advanced Directives 02/15/2018  Does Patient Have a Medical Advance Directive? Yes  Type of Estate agentAdvance Directive Healthcare Power of [place];Living will  Does patient want to make changes to medical advance directive? -  Copy of Healthcare Power of Attorney in Chart? No - copy requested     Chief Complaint  Patient presents with  . Medical Management of Chronic Issues    Extended Visit, EKG complete, no pap (hysterectomy). AWV completed today     HPI: Patient is a 73 y.o. female seen in today for an annual wellness exam.    Dietary modifications? Avoiding lactose.   Exercise?none    Dentition: overdue, usually goes every 6 months  Ophthalmology appt: yearly  Routine specialist: cardiology   Non-toxic multinodular goiter- has been going to an endocrinologist who has been monitoring this- was going yearly then every 2 years (went last year)  Hyperlipidemia- taking crestor 5 mg three times a week because it gives her cramps- tolerates 3 times a week much better than daily Attempts diet modifications.    Hypertension- taking metoprolol succinate 50 mg BID and lisinopril.   A fib- following with cardiology, recent  cath. Took her off digoxin and start amiodarone, continues on metoprolol and using xarelto for rate controlled.   [facility] 02/15/2018 12/07/2017  Decreased Interest 0 0  Down, Depressed, Hopeless 0 0  PHQ - 2 Score 0 0    Fall Risk  02/15/2018 12/07/2017  Falls in the past year? 0 1  Number falls in past yr: 0 0  Injury with Fall? 0 1  Risk for fall due to : - Other (Comment)  Risk for fall due to: Comment - tripped over box of decorations last December   MMSE - Mini Mental State Exam 02/15/2018  Orientation to time 5  Orientation to Place 5  Registration 3  Attention/ Calculation 5  Recall 3  Language- name 2 objects 2  Language- repeat 1  Language- follow 3 step command 1  Language- read & follow direction 1  Copy design 1     Health Maintenance  Topic Date Due  . Hepatitis C Screening  June 21, 1945  . MAMMOGRAM  08/08/1997  . TETANUS/TDAP  11/26/2017  . COLONOSCOPY  01/11/2018  . INFLUENZA VACCINE  Completed  . DEXA SCAN  Completed  . PNA vac Low Risk Adult  Completed    Past Medical History:  Diagnosis Date  . Achilles tendinitis    [facility]   . Atrial fibrillation [facility](HCC)    Per records from [facility] in [place], [place]   . Baker's cyst of knee, left    with resulting lower extremity edema  . Benign cyst of  breast, left    Per records from [facility] in [place], [place]. Has yearly U/S in the past   . Cerebrovascular disease    Per [facility] New Patient packet   . Chronic atrial fibrillation    Per [facility] New Patient packet   . Chronic atrial fibrillation    [name], MD  . Decreased hearing, bilateral    referred to audiologist, Per records from [facility] in [place], [place]   . Disorder of bone density and structure, unspecified    Per records from [facility] in [place], [place]   . Disorder of carotid artery (HCC)    Lynden Ang, MD  . Dyslipidemia    [name], MD  . Elevated  alkaline phosphatase level    Neg ative ANA 08/2013. Per records from [facility] in [place], [place]   . H/O calculus of kidney during pregnancy    Lynden Ang, MD  . H/O hysterectomy for benign disease    Lynden Ang, MD  . History of fibula fracture    Right, Per records from [facility] in [place], [place]   . History of torn meniscus of right knee    Per records from [facility] in [place], [place]   . Humerus fracture    left leg- due to fall  . Humerus fracture    Resulting from a fall, Per records from [facility] in [place], [place]   . Hypercalcemia    Per records from [facility] in [place], [place]   . Hypertension    Per [facility] New Patient packet   . Kidney stone on left side    Per records from [facility] in [place], [place]   . Lactose intolerance    Per [facility] New Patient packet   . Multiple actinic keratoses    Lynden Ang, MD  . Myalgia due to statin    Per records from [facility] in [address], [place]. Due to crestor   . 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 Normocytic anemia    Lynden Ang, MD  . Obstructive sleep apnea syndrome    Per records from [facility] in [place], [place]. Patient did not tolerate CPAP machine   . Onychomycosis    Per records from [facility] in [place], [place]   . Osteoarthritis    Per [facility] New Patient packet   . Osteopenia    Per records from [facility] in [place], [place]. Treated in the past with antiresorptive therapy  . Renal cyst 10/11/2012   Simple on CT Scan 10/2012. Per records from [facility] in [place], [place]   . 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 Umbilical hernia    Per [facility] New Patient packet   . Vitamin D deficiency    Lynden Ang, MD    Past Surgical History:  Procedure Laterality Date  . ABDOMINAL HYSTERECTOMY  1989   One ovary, Per [facility] New  Patient packet   . ABLATION  1996   Radical, Per [facility] New Patient packet   . COLONOSCOPY  2019   Dr.Melissa Fayrene Fearing, Per [facility] New Patient packet   . KNEE ARTHROSCOPY W/ MENISCAL REPAIR  2002   Per [facility] New Patient packet   . REPLACEMENT TOTAL KNEE Left 2015   Per [facility] New Patient packet  . RIGHT/LEFT HEART CATH AND CORONARY ANGIOGRAPHY N/A 02/07/2018   Procedure:  RIGHT/LEFT HEART CATH AND CORONARY ANGIOGRAPHY;  Surgeon: Swaziland, Peter M, MD;  Location: [facility] INVASIVE CV LAB;  Service: Cardiovascular;  Laterality: N/A;  . TONSILLECTOMY  1951   Per [facility] New Patient packet     Social History   Socioeconomic History  . Marital status: Married    Spouse name: Not on file  . Number of children: Not on file  . Years of education: Not on file  . Highest education level: Not on file  Occupational History  . Not on file  Social Needs  . Financial resource strain: Not on file  . Food insecurity:    Worry: Not on file    Inability: Not on file  . Transportation needs:    Medical: Not on file    Non-medical: Not on file  Tobacco Use  . Smoking status: Never Smoker  . Smokeless tobacco: Never Used  Substance and Sexual Activity  . Alcohol use: Yes    Comment: 1-2 drinks weekly - social   . Drug use: Never  . Sexual activity: Not on file  Lifestyle  . Physical activity:    Days per week: Not on file    Minutes per session: Not on file  . Stress: Not on file  Relationships  . Social connections:    Talks on phone: Not on file    Gets together: Not on file    Attends religious service: Not on file    Active member of club or organization: Not on file    Attends meetings of clubs or organizations: Not on file    Relationship status: Not on file  Other Topics Concern  . 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 No   Do you have difficulty preparing food or eating? No   Do you have difficulty managing your medications? No   Do you have difficulty managing your finances? No   Do you have difficulty affording your medications? No    Family History  Problem Relation Age of Onset  . Congestive Heart Failure Mother   . Diabetes Mother 58  . Arthritis Mother 54  . Stroke Father   . Dementia Father   . Diabetes Father   . Hyperlipidemia Father   . Alzheimer's disease Father 82  . Diabetes Sister   . Cancer Maternal Grandfather   . Diabetes Maternal Grandfather 60  . CVA Maternal Grandfather   . AAA (abdominal aortic aneurysm) Sister   . Congenital heart disease Daughter   . Heart attack Maternal Aunt   . Lung cancer Maternal Grandmother 80  . Breast cancer Paternal Aunt     Review of Systems:  Review of Systems  Constitutional: Negative for chills, fever and weight loss.  HENT: Positive for hearing loss. Negative for tinnitus.   Respiratory: Negative for cough, sputum production and shortness of breath.   Cardiovascular: Positive for palpitations. Negative for chest pain and leg swelling.  Gastrointestinal: Negative for abdominal pain, constipation, diarrhea and heartburn.  Genitourinary: Negative for dysuria, frequency and urgency.  Musculoskeletal: Positive for joint pain and myalgias. Negative for back pain and falls.  Skin: Negative.   Neurological: Negative for dizziness and headaches.  Endo/Heme/Allergies: Positive for environmental allergies.  Psychiatric/Behavioral: Negative for depression and memory loss. The  patient does not have insomnia.     Allergies as of 02/15/2018      Reactions   Adhesive [tape]    Skin irritation    Grass Extracts [gramineae Pollens]     Per records from from [facility]   Lactose Intolerance (gi) Other (See Comments)   Unknown   Naprosyn [naproxen] Hives   Coq10 [coenzyme Q10] Other (See Comments)   Cramps    Latex Rash   Zocor [simvastatin] Other (See Comments)   cramps      Medication List       Accurate as of February 15, 2018  2:05 PM. Always use your most recent med list.        acetaminophen 650 MG CR tablet Commonly known as:  TYLENOL Take 650 mg by mouth 2 (two) times daily.   amiodarone 200 MG tablet Commonly known as:  PACERONE Take 1 tablet (200 mg total) by mouth daily.   amiodarone 200 MG tablet Commonly known as:  PACERONE Take 1 tablet (200 mg total) by mouth 2 (two) times daily. For two weeks   CALCIUM 600/VITAMIN D3 PO Take 1 tablet by mouth 2 (two) times daily.   lisinopril 20 MG tablet Commonly known as:  PRINIVIL,ZESTRIL Take 1 tablet (20 mg total) by mouth daily.   metoprolol succinate 50 MG 24 hr tablet Commonly known as:  TOPROL-XL Take 50 mg by mouth 2 (two) times daily. Take with or immediately following a meal.   OCUVITE PO Take 1 tablet by mouth daily.   PHILLIPS COLON HEALTH PO Take 1 capsule by mouth daily.   polycarbophil 625 MG tablet Commonly known as:  FIBERCON Take 1,250 mg by mouth daily.   rivaroxaban 20 MG Tabs tablet Commonly known as:  XARELTO TAKE ONE TABLET BY MOUTH DAILY WITH SUPPER   rosuvastatin 5 MG tablet Commonly known as:  CRESTOR Take 5 mg by mouth 3 (three) times a week.   SINEX ULTRA FINE MIST 12-HOUR NA Place 1 spray into both nostrils daily.   Vitamin D3 25 MCG (1000 UT) Caps Take 1,000 Units by mouth daily.         Physical Exam: Vitals:   02/15/18 1331  BP: 128/76  Pulse: 81  Resp: 10  Temp: 97.8 F (36.6 C)  TempSrc: Oral  SpO2: 95%  Weight: 192  lb (87.1 kg)  Height: 5\' 1"  (1.549 m)   Body mass index is 36.28 kg/m. Physical Exam Constitutional:      General: She is not in acute distress.    Appearance: She is well-developed. She is not diaphoretic.  HENT:     Head: Normocephalic and atraumatic.     Right Ear: Tympanic membrane, ear canal and external ear normal. There is no impacted cerumen.     Left Ear: Tympanic membrane, ear canal and external ear normal. There is no impacted cerumen.     Nose: Congestion present.     Mouth/Throat:  Pharynx: No oropharyngeal exudate.  Eyes:     Conjunctiva/sclera: Conjunctivae normal.     Pupils: Pupils are equal, round, and reactive to light.  Neck:     Musculoskeletal: Normal range of motion and neck supple.  Cardiovascular:     Rate and Rhythm: Normal rate. Rhythm irregular.     Heart sounds: Normal heart sounds.  Pulmonary:     Effort: Pulmonary effort is normal.     Breath sounds: Normal breath sounds.  Chest:     Breasts:        Right: Normal.        Left: Normal.  Abdominal:     General: Bowel sounds are normal. There is no distension.     Palpations: Abdomen is soft.     Tenderness: There is no abdominal tenderness.  Musculoskeletal: Normal range of motion.        General: No tenderness.  Skin:    General: Skin is warm and dry.  Neurological:     Mental Status: She is alert and oriented to person, place, and time.     Labs reviewed: Basic Metabolic Panel: Recent Labs    06/17/17 12/07/17 1004 02/02/18 0949 02/07/18 0810 02/07/18 0816  NA 143 140 138 139 138  K 4.5 4.3 4.5 3.7 3.8  CL  --  103 99  --   --   CO2  --  28 25  --   --   GLUCOSE  --  95 78  --   --   BUN 14 14 18   --   --   CREATININE 0.9 0.91 0.97  --   --   CALCIUM  --  9.6 10.0  --   --   TSH  --  1.74  --   --   --    Liver Function Tests: Recent Labs    06/17/17 12/07/17 1004  AST 14 17  ALT 7 8  ALKPHOS 80  --   BILITOT  --  0.6  PROT  --  6.8   No results for input(s):  LIPASE, AMYLASE in the last 8760 hours. No results for input(s): AMMONIA in the last 8760 hours. CBC: Recent Labs    12/07/17 1004 02/02/18 0949 02/07/18 0810 02/07/18 0816  WBC 4.2 5.7  --   --   NEUTROABS 2,272  --   --   --   HGB 12.8 12.8 12.2 11.9*  HCT 39.1 38.6 36.0 35.0*  MCV 94.4 96  --   --   PLT 181 179  --   --    Lipid Panel: Recent Labs    12/07/17 1004  CHOL 200*  HDL 92  LDLCALC 90  TRIG 90  CHOLHDL 2.2   Lab Results  Component Value Date   HGBA1C 5.5 01/28/2017    Procedures: Dg Foot 2 Views Left  Result Date: 01/16/2018 Please see detailed radiograph report in office note.  Dg Foot 2 Views Right  Result Date: 01/16/2018 Please see detailed radiograph report in office note.   Assessment/Plan 1. Osteoporosis without current pathological fracture, unspecified osteoporosis type Last dexa scan 02/22/17, previous arm fracture, currently noted to have osteopenia.   2. Primary osteoarthritis involving multiple joints Stable at this time, without increase in pain.   3. Chronic atrial fibrillation Following with cardiology at this time. Rate controlled on amiodarone.   4. Essential hypertension Controlled on   5. Non-toxic multinodular goiter -had been following with   6. Dyslipidemia -stable, continues on  crestor 3 times weekly  7. Nasal congestion neti pot twice daily as needed  Plain nasal saline spray throughout the day as needed humidifier in the home to help with the dry air Keep well hydrated flonase 1 spray into both nares daily Avoid forcefully blowing nose Has been using afrin- to avoid using this due to rebound congestion  Next appt: 6 months for routine follow up.  Janene Harvey. Biagio Borg  [facility] [phone]

## 2018-02-15 NOTE — Telephone Encounter (Signed)
Pt updated with monitor results along with Dr. Di Kindle recommendation. Pt verbalized understanding.

## 2018-02-21 DIAGNOSIS — H5213 Myopia, bilateral: Secondary | ICD-10-CM | POA: Diagnosis not present

## 2018-02-21 DIAGNOSIS — H353131 Nonexudative age-related macular degeneration, bilateral, early dry stage: Secondary | ICD-10-CM | POA: Diagnosis not present

## 2018-02-21 DIAGNOSIS — H1859 Other hereditary corneal dystrophies: Secondary | ICD-10-CM | POA: Diagnosis not present

## 2018-02-21 DIAGNOSIS — H52203 Unspecified astigmatism, bilateral: Secondary | ICD-10-CM | POA: Diagnosis not present

## 2018-03-02 ENCOUNTER — Ambulatory Visit: Payer: Medicare Other | Admitting: Cardiology

## 2018-03-02 ENCOUNTER — Encounter: Payer: Self-pay | Admitting: Cardiology

## 2018-03-02 ENCOUNTER — Ambulatory Visit (INDEPENDENT_AMBULATORY_CARE_PROVIDER_SITE_OTHER): Payer: Medicare Other | Admitting: Cardiology

## 2018-03-02 VITALS — BP 167/87 | HR 82 | Ht 62.0 in | Wt 192.4 lb

## 2018-03-02 DIAGNOSIS — I5189 Other ill-defined heart diseases: Secondary | ICD-10-CM | POA: Diagnosis not present

## 2018-03-02 DIAGNOSIS — I428 Other cardiomyopathies: Secondary | ICD-10-CM

## 2018-03-02 DIAGNOSIS — I340 Nonrheumatic mitral (valve) insufficiency: Secondary | ICD-10-CM

## 2018-03-02 DIAGNOSIS — I2583 Coronary atherosclerosis due to lipid rich plaque: Secondary | ICD-10-CM

## 2018-03-02 DIAGNOSIS — I361 Nonrheumatic tricuspid (valve) insufficiency: Secondary | ICD-10-CM | POA: Diagnosis not present

## 2018-03-02 DIAGNOSIS — I2510 Atherosclerotic heart disease of native coronary artery without angina pectoris: Secondary | ICD-10-CM

## 2018-03-02 DIAGNOSIS — I482 Chronic atrial fibrillation, unspecified: Secondary | ICD-10-CM | POA: Diagnosis not present

## 2018-03-02 DIAGNOSIS — I10 Essential (primary) hypertension: Secondary | ICD-10-CM | POA: Diagnosis not present

## 2018-03-02 DIAGNOSIS — R931 Abnormal findings on diagnostic imaging of heart and coronary circulation: Secondary | ICD-10-CM | POA: Diagnosis not present

## 2018-03-02 NOTE — Progress Notes (Signed)
Cardiology Office Note:    Date:  03/02/2018   ID:  Jennifer Mcclain, DOB 07/12/1945, MRN [id number]  PCP:  [name], NP  Cardiologist:  [name], MD PhD  Referring MD: [name], NP   CC: follow up  History of Present Illness:    Jennifer Mcclain is a 73 y.o. female with a hx of atrial fibrillation, hyperlipidemia, hypertension who is seen for follow up for nonischemic cardiomyopathy. Initial consult with me was on 01/17/18 for atrial fibrillation.  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 Had an ablation done, but was told part of it was too close to the His bundle and so the EP cardiologist ablated all around it but not in the bundle. Moved to [place] about 10 years ago, was told there was no evidence of WPW. She had an episode of afib about 5 years ago that was fast, but has otherwise been well controlled. She can feel when she has atrial fibrillation, usually a few times/day. Doesn't stop her from doing her daily activities. Doesn't think it lasts long. No syncope. No chest pain. Does have some shortness of breath.  Daughter passed away from heart disease; had a kink in one of her arteries, died at age 34.  Since coming to [facility], had echo with abnormal EF, moderate MR, moderate-severe TR. Cath did not show significant CAD.   Today: reviewed results of cath, next steps. Overall she is feeling well. Does not weigh herself routinely or check BP at home. Feels less afib than she has in the past. No PND, orthopnea, significant LE edema. No syncope. No chest pain.   Past Medical History:  Diagnosis Date  . Achilles tendinitis    Per [facility] New Patient packet   . Atrial fibrillation [facility])    Per records from [facility] in [place], [place]   . Baker's cyst of knee, left    with resulting lower extremity edema  . Benign cyst of breast, left    Per records from [facility] in [place], [place]. Has yearly U/S in the past   .  Cerebrovascular disease    Per [facility] New Patient packet   . Chronic atrial fibrillation    Per [facility] New Patient packet   . Chronic atrial fibrillation    [name], MD  . Decreased hearing, bilateral    referred to audiologist, Per records from [facility] in [place], [place]   . Disorder of bone density and structure, unspecified    Per records from [facility] in [place], [place]   . Disorder of carotid artery (HCC)    Lynden Ang, MD  . Dyslipidemia    [name], MD  . Elevated alkaline phosphatase level    Neg ative ANA 08/2013. Per records from [facility] in [place], [place]   . H/O calculus of kidney during pregnancy    Lynden Ang, MD  . H/O hysterectomy for benign disease    Lynden Ang, MD  . History of fibula fracture    Right, Per records from [facility] in [place], [place]   . History of torn meniscus of right knee    Per records from [facility] in [place], [place]   . Humerus fracture    left leg- due to fall  . Humerus fracture    Resulting from a fall, Per records from [facility] in [place], [place]   . Hypercalcemia    Per records from [facility] in [place], [place]   . Hypertension    Per [facility] New Patient packet   . Kidney stone on left side    Per records from [facility] in [place], [place]   . Lactose intolerance    Per [facility] New Patient packet   . Multiple actinic keratoses    Lynden Ang, MD  . Myalgia due to statin    Per records from [facility] in [place], [place]. Due to crestor   . 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 Normocytic anemia    Lynden Ang, MD  . Obstructive sleep apnea syndrome    Per records from [facility] in [place], [place]. Patient did not tolerate CPAP machine   . Onychomycosis    Per records from [facility] in [place], [place]   . Osteoarthritis    Per [facility] New Patient packet   .  Osteopenia    Per records from [facility] in [place], [place]. Treated in the past with antiresorptive therapy  . Renal cyst 10/11/2012   Simple on CT Scan 10/2012. Per records from [facility] in [place], [place]   . 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 Umbilical hernia    Per [facility] New Patient packet   . Vitamin D deficiency    Lynden Ang, MD    Past Surgical History:  Procedure Laterality Date  . ABDOMINAL HYSTERECTOMY  1989   One ovary, Per [facility] New Patient packet   . ABLATION  1996   Radical, Per [facility] New Patient packet   . COLONOSCOPY  2019   Dr.Melissa Fayrene Fearing, Per [facility] New Patient packet   . KNEE ARTHROSCOPY W/ MENISCAL REPAIR  2002   Per [facility] New Patient packet   . REPLACEMENT TOTAL KNEE Left 2015   Per [facility] New Patient packet  . RIGHT/LEFT HEART CATH AND CORONARY ANGIOGRAPHY N/A 02/07/2018   Procedure: RIGHT/LEFT HEART CATH AND CORONARY ANGIOGRAPHY;  Surgeon: Swaziland, Peter M, MD;  Location: [facility] INVASIVE CV LAB;  Service: Cardiovascular;  Laterality: N/A;  . TONSILLECTOMY  1951   Per [facility] New Patient packet     Current Medications: Current Outpatient Medications on File Prior to Visit  Medication Sig  . acetaminophen (TYLENOL) 650 MG CR tablet Take 650 mg by mouth 2 (two) times daily.   Marland Kitchen amiodarone (PACERONE) 200 MG tablet Take 1 tablet (200 mg total) by mouth daily.  . Calcium Carb-Cholecalciferol (CALCIUM 600/VITAMIN D3 PO) Take 1 tablet by mouth 2 (two) times daily.  . Cholecalciferol (VITAMIN D3) 25 MCG (1000 UT) CAPS Take 1,000 Units by mouth daily.   Marland Kitchen lisinopril (PRINIVIL,ZESTRIL) 20 MG tablet Take 1 tablet (20 mg total) by mouth daily.  . metoprolol succinate (TOPROL-XL) 50 MG 24 hr tablet Take 50 mg by mouth 2 (two) times daily. Take with or immediately following a meal.  . Multiple Vitamins-Minerals (OCUVITE PO) Take 1 tablet by mouth daily.   . Oxymetazoline HCl (SINEX ULTRA FINE MIST 12-HOUR NA) Place 1  spray into both nostrils daily.   . polycarbophil (FIBERCON) 625 MG tablet Take 1,250 mg by mouth daily.  . Probiotic Product (PHILLIPS COLON HEALTH PO) Take 1 capsule by mouth daily.   . rivaroxaban (XARELTO) 20 MG TABS tablet TAKE ONE TABLET BY MOUTH DAILY WITH SUPPER  . rosuvastatin (CRESTOR) 5 MG tablet Take 5 mg by mouth 3 (three) times a week.   No current facility-administered  medications on file prior to visit.      Allergies:   Adhesive [tape]; Grass extracts [gramineae pollens]; Lactose intolerance (gi); Naprosyn [naproxen]; Coq10 [coenzyme q10]; Latex; and Zocor [simvastatin]   Social History   Socioeconomic History  . Marital status: Married    Spouse name: Not on file  . Number of children: Not on file  . Years of education: Not on file  . Highest education level: Not on file  Occupational History  . Not on file  Social Needs  . Financial resource strain: Not on file  . Food insecurity:    Worry: Not on file    Inability: Not on file  . Transportation needs:    Medical: Not on file    Non-medical: Not on file  Tobacco Use  . Smoking status: Never Smoker  . Smokeless tobacco: Never Used  Substance and Sexual Activity  . Alcohol use: Yes    Comment: 1-2 drinks weekly - social   . Drug use: Never  . Sexual activity: Not on file  Lifestyle  . Physical activity:    Days per week: Not on file    Minutes per session: Not on file  . Stress: Not on file  Relationships  . Social connections:    Talks on phone: Not on file    Gets together: Not on file    Attends religious service: Not on file    Active member of club or organization: Not on file    Attends meetings of clubs or organizations: Not on file    Relationship status: Not on file  Other Topics Concern  . 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 No   Do you have difficulty preparing food or eating? No   Do you have difficulty managing your medications? No   Do you have difficulty managing your finances? No   Do you have difficulty affording your medications? No     Family History: The patient's family history includes AAA (abdominal aortic aneurysm) in her sister; Alzheimer's disease (age of onset: 73) in her father; Arthritis (age of onset: 53) in her mother; Breast cancer in her paternal aunt; CVA in her maternal grandfather; Cancer in her maternal grandfather; Congenital heart disease in her daughter; Congestive Heart Failure in her mother; Dementia in her father; Diabetes in her father and sister; Diabetes (age of onset: 11) in her mother; Diabetes (age of onset: 36) in her maternal grandfather; Heart attack in her maternal aunt; Hyperlipidemia in her father; Lung cancer (age of onset: 46) in her maternal grandmother; Stroke in her father.  ROS:   Please see the history of present illness.  Additional pertinent ROS:  Constitutional: Negative for chills, fever, night sweats, unintentional  weight loss  HENT: Negative for ear pain and hearing loss.   Eyes: Negative for loss of vision and eye pain.  Respiratory: Negative for cough, sputum, shortness of breath, wheezing.   Cardiovascular: Positive for palpitations. Negative for chest pain, PND, orthopnea, lower extremity edema and claudication.  Gastrointestinal: Negative for abdominal pain, melena, and hematochezia.  Genitourinary: Negative for dysuria and hematuria.  Musculoskeletal: Negative  for falls and myalgias.  Skin: Negative for itching and rash.  Neurological: Negative for focal weakness, focal sensory changes and loss of consciousness.  Endo/Heme/Allergies: Does not bruise/bleed easily.    EKGs/Labs/Other Studies Reviewed:    The following studies were reviewed today: Monitor 02/13/18 ~3 days of data recorded on Zio monitor. No high degree block or pauses noted. Isolated atrial and ventricular ectopy was rare (<1%). There were 2 patient triggered events, one SVT and one sinus rhythm with PVC.   1 run of tachycardia occurred lasting 11 beats with a max rate of 200 bpm (avg 168 bpm). It is labeled as VT on the monitor. While VT cannot be excluded, it appears likely atrial fibrillation with rate related aberrancy. Atrial Fibrillation occurred continuously (100% burden), ranging from 51-163 bpm (avg of 92 bpm). Isolated VEs were occasional (2.7%), VE Couplets were rare (<1.0%), and VE Triplets were rare (<1.0%, 1). Ventricular Bigeminy and Trigeminy were rare, with individual episodes lasting 5 seconds or less.  Cath 02/07/18  Prox LAD lesion is 20% stenosed.  LV end diastolic pressure is mildly elevated.  There is moderate left ventricular systolic dysfunction.  Hemodynamic findings consistent with mild pulmonary hypertension.   1. Minimal nonobstructive CAD with calcified plaque in the proximal LAD 2. Moderate LV dysfunction. EF estimated at 40-45%.  3. Mildly elevated LV filling pressures. V waves prominent on PCWP tracing 4. Mild pulmonary HTN 5. Low cardiac output. Index 2.06. 6. Mild to Moderate MR.   Plan: Optimize medical therapy for CHF. Consider repeat Echo once medical therapy optimized to reassess valve regurgitation.   Fick Cardiac Output 3.88 L/min  Fick Cardiac Output Index 2.06 (L/min)/BSA  RA A Wave -99 mmHg  RA V Wave 11 mmHg  RA Mean 9 mmHg  RV Systolic Pressure 44 mmHg  RV Diastolic Pressure 2 mmHg  RV EDP 9 mmHg  PA Systolic Pressure 47  mmHg  PA Diastolic Pressure 13 mmHg  PA Mean 34 mmHg  PW A Wave -99 mmHg  PW V Wave 32 mmHg  PW Mean 21 mmHg  AO Systolic Pressure 137 mmHg  AO Diastolic Pressure 73 mmHg  AO Mean 99 mmHg  LV Systolic Pressure 137 mmHg  LV Diastolic Pressure 8 mmHg  LV EDP 18 mmHg  AOp Systolic Pressure 138 mmHg  AOp Diastolic Pressure 67 mmHg  AOp Mean Pressure 94 mmHg  LVp Systolic Pressure 148 mmHg  LVp Diastolic Pressure 10 mmHg  LVp EDP Pressure 15 mmHg  QP/QS 1  TPVR Index 16.45 HRUI  TSVR Index 47.91 HRUI  PVR SVR Ratio 0.1  TPVR/TSVR Ratio 0.34     Echo 01/25/18 Study Conclusions  - Left ventricle: The cavity size was normal. Systolic function was   moderately reduced. The estimated ejection fraction was in the   range of 35% to 40%. Although no diagnostic regional wall motion   abnormality was identified, this possibility cannot be completely   excluded on the basis of this study. - Aortic valve: Transvalvular velocity was within the normal range.   There was no stenosis. There was no regurgitation. -  Mitral valve: Moderately calcified annulus. There was moderate   regurgitation. - Left atrium: The atrium was moderately dilated. - Right ventricle: The cavity size was mildly dilated. Wall   thickness was normal. Systolic function was normal. RV systolic   pressure (S, est): 44 mm Hg. - Right atrium: The atrium was moderately dilated. Central venous   pressure (est): 10 mm Hg. - Tricuspid valve: Dilated annulus. Structurally normal valve.   There was moderate-severe regurgitation. - Pulmonic valve: There was mild regurgitation. - Pulmonary arteries: Systolic pressure was moderately increased.   PA peak pressure: 44 mm Hg (S). - Inferior vena cava: The vessel was normal in size. The   respirophasic diameter changes were blunted (< 50%). - Pericardium, extracardiac: There was no pericardial effusion.  EKG:  EKG is personally reviewed.  The ekg ordered today demonstrates  atrial fibrillation with RVR, rate 106 bpm.   Recent Labs: 12/07/2017: ALT 8; TSH 1.74 02/02/2018: BUN 18; Creatinine, Ser 0.97; Platelets 179 02/07/2018: Hemoglobin 11.9; Potassium 3.8; Sodium 138  Recent Lipid Panel    Component Value Date/Time   CHOL 200 (H) 12/07/2017 1004   TRIG 90 12/07/2017 1004   HDL 92 12/07/2017 1004   CHOLHDL 2.2 12/07/2017 1004   LDLCALC 90 12/07/2017 1004    Physical Exam:    VS:  BP (!) 167/87   Pulse 82   Ht 5\' 2"  (1.575 m)   Wt 192 lb 6.4 oz (87.3 kg)   BMI 35.19 kg/m     Wt Readings from Last 3 Encounters:  03/02/18 192 lb 6.4 oz (87.3 kg)  02/15/18 192 lb (87.1 kg)  02/15/18 192 lb (87.1 kg)     GEN: Well nourished, well developed in no acute distress HEENT: Normal NECK: No JVD appreciated; No carotid bruits LYMPHATICS: No lymphadenopathy CARDIAC: rate controlled, irregularly irregular rhythm, normal S1 and S2, did not appreciate murmurs, rubs, gallops. Radial and DP pulses 2+ bilaterally. RESPIRATORY:  Clear to auscultation without rales, wheezing or rhonchi  ABDOMEN: Soft, non-tender, non-distended MUSCULOSKELETAL:  No edema; No deformity  SKIN: Warm and dry NEUROLOGIC:  Alert and oriented x 3 PSYCHIATRIC:  Normal affect   ASSESSMENT:    1. Cardiomyopathy, nonischemic (HCC)   2. Abnormal echocardiogram   3. Chronic atrial fibrillation   4. Nonrheumatic mitral valve regurgitation   5. Nonrheumatic tricuspid valve regurgitation   6. Essential hypertension   7. 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 Coronary artery disease due to lipid rich plaque    PLAN:    Newly diagnosed cardiomyopathy with reduced EF, moderate MR, and moderate-severe TR: nonischemic cardiomyopathy  -cath with minimal nonobstructive CAD  -MR more mild-moderate on cath  -has tolerated lisinopril and metoprolol. Has blood pressure room for titration today. Discussed possibility of entresto. She would like to meet with PharmD to discuss, concerned  about cost  -if elects not to pursue entresto, then would uptitrate lisinopril and beta blocker as she has blood pressure room  -repeat echo in 3 mos  Likely transition from paroxysmal to persistent atrial fibrillation: she is symptomatic with her atrial fibrillation. Recent monitor showed 100% burden of afib. Does feel better on amiodarone. Irregular on exam today. If atrial sized not severely dilated on repeat echo, could consider cardioversion, though unclear given her burden if this would hold in sinus rhythm. Would be reasonable to attempt once to see if we can get her in sinus.  -recent TFTs and LFTs WNL. Will check PFTs at follow up.  -  CHA2DS2/VAS Stroke Risk Points=3, continue rivaroxaban for anticoagulation  Mitral and tricuspid regurgitation: suspect this is secondary to her heart failure with reduced EF. Will continue to medically manage HF, afib  Hypertension: borderline control on lisinopril and metoprolol, either transition to entresto or uptitrate  Hyperlipidemia (hypercholesterolemia): on rosuvastatin, LDL 90 without prior history of ASCVD. Had nonobstructive CAD on cath. No aspirin as she is on rivaroxaban, currently tolerating 3x/week rosuvastatin. Once optimized on HF meds, would then consider increasing statin. Goal LDL <70  Plan for follow up: with PharmD to discuss entresto, then 3 mos with me.  TIME SPENT WITH PATIENT: 25 minutes of direct patient care. More than 50% of that time was spent on coordination of care and counseling regarding test results, medication management options.  [name], MD, PhD [place]  [facility]   Medication Adjustments/Labs and Tests Ordered: Current medicines are reviewed at length with the patient today.  Concerns regarding medicines are outlined above.  Orders Placed This Encounter  Procedures  . ECHOCARDIOGRAM COMPLETE   No orders of the defined types were placed in this encounter.   Patient Instructions    Medication Instructions:  Your Physician recommend you continue on your current medication as directed.    If you need a refill on your cardiac medications before your next appointment, please call your pharmacy.   Lab work: None  Testing/Procedures: Your physician has requested that you have an echocardiogram in 3 months. Echocardiography is a painless test that uses sound waves to create images of your heart. It provides your doctor with information about the size and shape of your heart and how well your heart's chambers and valves are working. This procedure takes approximately one hour. There are no restrictions for this procedure. [address]   Follow-Up: At [facility], you and your health needs are our priority.  As part of our continuing mission to provide you with exceptional heart care, we have created designated Provider Care Teams.  These Care Teams include your primary Cardiologist (physician) and Advanced Practice Providers (APPs -  Physician Assistants and Nurse Practitioners) who all work together to provide you with the care you need, when you need it. You will need a follow up appointment in 3 months.  Please call our office 2 months in advance to schedule this appointment.  You may see [name], MD or one of the following Advanced Practice Providers on your designated Care Team:   Theodore Demark, PA-C . Joni Reining, DNP, ANP  Your physician recommends that you schedule a follow-up appointment with Pharm D to discuss Entresto.       Signed, [name], MD PhD 03/02/2018 4:16 PM    [facility]

## 2018-03-02 NOTE — Patient Instructions (Signed)
Medication Instructions:  Your Physician recommend you continue on your current medication as directed.    If you need a refill on your cardiac medications before your next appointment, please call your pharmacy.   Lab work: None  Testing/Procedures: Your physician has requested that you have an echocardiogram in 3 months. Echocardiography is a painless test that uses sound waves to create images of your heart. It provides your doctor with information about the size and shape of your heart and how well your heart's chambers and valves are working. This procedure takes approximately one hour. There are no restrictions for this procedure. [address]   Follow-Up: At [facility], you and your health needs are our priority.  As part of our continuing mission to provide you with exceptional heart care, we have created designated Provider Care Teams.  These Care Teams include your primary Cardiologist (physician) and Advanced Practice Providers (APPs -  Physician Assistants and Nurse Practitioners) who all work together to provide you with the care you need, when you need it. You will need a follow up appointment in 3 months.  Please call our office 2 months in advance to schedule this appointment.  You may see [name], MD or one of the following Advanced Practice Providers on your designated Care Team:   Theodore Demark, PA-C . Joni Reining, DNP, ANP  Your physician recommends that you schedule a follow-up appointment with Pharm D to discuss Entresto.

## 2018-03-06 ENCOUNTER — Other Ambulatory Visit: Payer: Self-pay | Admitting: Nurse Practitioner

## 2018-03-23 ENCOUNTER — Other Ambulatory Visit: Payer: Self-pay

## 2018-03-23 ENCOUNTER — Ambulatory Visit
Admission: RE | Admit: 2018-03-23 | Discharge: 2018-03-23 | Disposition: A | Discharge status: Home / Self Care | Payer: Medicare Other | Source: Ambulatory Visit | Attending: Nurse Practitioner | Admitting: Nurse Practitioner

## 2018-03-23 DIAGNOSIS — Z1231 Encounter for screening mammogram for malignant neoplasm of breast: Secondary | ICD-10-CM | POA: Diagnosis not present

## 2018-03-23 DIAGNOSIS — Z0000 Encounter for general adult medical examination without abnormal findings: Secondary | ICD-10-CM

## 2018-03-24 ENCOUNTER — Ambulatory Visit (INDEPENDENT_AMBULATORY_CARE_PROVIDER_SITE_OTHER): Payer: Medicare Other | Admitting: Pharmacist Clinician (PhC)/ Clinical Pharmacy Specialist

## 2018-03-24 ENCOUNTER — Other Ambulatory Visit: Payer: Self-pay

## 2018-03-24 VITALS — BP 148/86 | HR 76

## 2018-03-24 DIAGNOSIS — I2510 Atherosclerotic heart disease of native coronary artery without angina pectoris: Secondary | ICD-10-CM

## 2018-03-24 DIAGNOSIS — I2583 Coronary atherosclerosis due to lipid rich plaque: Secondary | ICD-10-CM

## 2018-03-24 DIAGNOSIS — I5042 Chronic combined systolic (congestive) and diastolic (congestive) heart failure: Secondary | ICD-10-CM

## 2018-03-24 NOTE — Assessment & Plan Note (Signed)
Patient with cardiomyopathy and reduced EF at 35-40%.  Will have her start Entresto 49/51 tomorrow, to be sure there are 36 hours from last dose of lisinopril.  She will return in 2 weeks for follow up, dose titration and metabolic panel.  Currently patient takes Xarelto, so hopefully the Sherryll Burger will be at the same copay level.  Patient encouraged to continue with home exercise and dietary modifications.

## 2018-03-24 NOTE — Progress Notes (Signed)
03/24/2018 [facility] 1945/07/31 [id number]   HPI:  Jennifer Mcclain is a 73 y.o. female patient of Dr [name], with a PMH below who presents today for heart failure medication titration.  In addition to cardiomyopathy with EF 35-40%, her medical history is significant for hypertension, chronic atrial fibrillation, osteopenia, hyperlipidemia and OSA (does not need CPAP if sleeps on side).     Today she is in the office to discuss transition from lisinopril to [facility].  She has many questions and we spent 20 minutes discussing cardiomyopathy, ejection fraction and atrial fibrillation.  She is just starting an exercise program, and is hopeful that with eating right and exercising, she may not need medications long term.    Blood Pressure Goal:  130/80  Current Medications:  Lisinopril 20 mg qd - pm  Metoprolol succinate 50 mg bid  Family Hx:  Father - DM, hyperlipidemia, stroke  Mother - DM  Sister - AAA, DM  Daughter - congenital heart disease, died at 38  Social Hx:  No tobacco, drinks social alcohol  Diet:  Eats mix of restaurant and home meals, does not add salt or Ms. Dash. Avoids high sodium products  Exercise: recently started a chair exercise class, three times per week.     Home BP readings: has home meter, but usually reads error - most likely due to chronic atrial fibrillation  Intolerances: simvastatin caused muscle cramps  Labs:  01/2018:  Na 138, K 4.5, Glu 78, BUN 18, SCr 0.97  Wt Readings from Last 3 Encounters:  03/02/18 192 lb 6.4 oz (87.3 kg)  02/15/18 192 lb (87.1 kg)  02/15/18 192 lb (87.1 kg)   BP Readings from Last 3 Encounters:  03/24/18 (!) 148/86  03/02/18 (!) 167/87  02/15/18 128/76   Pulse Readings from Last 3 Encounters:  03/24/18 76  03/02/18 82  02/15/18 81    Current Outpatient Medications  Medication Sig Dispense Refill  . acetaminophen (TYLENOL) 650 MG CR tablet Take 650 mg by mouth 2 (two) times daily.     Marland Kitchen  amiodarone (PACERONE) 200 MG tablet Take 1 tablet (200 mg total) by mouth daily. 90 tablet 3  . Calcium Carb-Cholecalciferol (CALCIUM 600/VITAMIN D3 PO) Take 1 tablet by mouth 2 (two) times daily.    . Cholecalciferol (VITAMIN D3) 25 MCG (1000 UT) CAPS Take 1,000 Units by mouth daily.     . fluticasone (FLONASE) 50 MCG/ACT nasal spray Place 1 spray into both nostrils daily.    Marland Kitchen lisinopril (PRINIVIL,ZESTRIL) 20 MG tablet Take 1 tablet (20 mg total) by mouth daily. 90 tablet 1  . metoprolol succinate (TOPROL-XL) 50 MG 24 hr tablet Take 50 mg by mouth 2 (two) times daily. Take with or immediately following a meal.    . Multiple Vitamins-Minerals (OCUVITE PO) Take 1 tablet by mouth daily.     . polycarbophil (FIBERCON) 625 MG tablet Take 1,250 mg by mouth daily.    . Probiotic Product (PHILLIPS COLON HEALTH PO) Take 1 capsule by mouth daily.     . rosuvastatin (CRESTOR) 5 MG tablet Take 5 mg by mouth 3 (three) times a week.    Carlena Hurl 20 MG TABS tablet TAKE ONE TABLET BY MOUTH DAILY WITH SUPPER 30 tablet 3   No current facility-administered medications for this visit.     Allergies  Allergen Reactions  . Adhesive [Tape]     Skin irritation   . Grass Extracts [Gramineae Pollens]  Per records from from [facility]  . Lactose Intolerance (Gi) Other (See Comments)    Unknown  . Naprosyn [Naproxen] Hives  . Coq10 [Coenzyme Q10] Other (See Comments)    Cramps   . Latex Rash  . Zocor [Simvastatin] Other (See Comments)    cramps    Past Medical History:  Diagnosis Date  . Achilles tendinitis    Per [facility] New Patient packet   . Atrial fibrillation [facility])    Per records from [facility] in [place], [place]   . Baker's cyst of knee, left    with resulting lower extremity edema  . Benign cyst of breast, left    Per records from [facility] in [place], [place]. Has yearly U/S in the past   . Cerebrovascular disease    Per [facility] New Patient packet   . Chronic  atrial fibrillation    Per [facility] New Patient packet   . Chronic atrial fibrillation    [name], MD  . Decreased hearing, bilateral    referred to audiologist, Per records from [facility] in [place], [place]   . Disorder of bone density and structure, unspecified    Per records from [facility] in [place], [place]   . Disorder of carotid artery (HCC)    Lynden Ang, MD  . Dyslipidemia    [name], MD  . Elevated alkaline phosphatase level    Neg ative ANA 08/2013. Per records from [facility] in [place], [place]   . H/O calculus of kidney during pregnancy    Lynden Ang, MD  . H/O hysterectomy for benign disease    Lynden Ang, MD  . History of fibula fracture    Right, Per records from [facility] in [place], [place]   . History of torn meniscus of right knee    Per records from [facility] in [place], [place]   . Humerus fracture    left leg- due to fall  . Humerus fracture    Resulting from a fall, Per records from [facility] in [place], [place]   . Hypercalcemia    Per records from [facility] in [place], [place]   . Hypertension    Per [facility] New Patient packet   . Kidney stone on left side    Per records from [facility] in [place], [place]   . Lactose intolerance    Per [facility] New Patient packet   . Multiple actinic keratoses    Lynden Ang, MD  . Myalgia due to statin    Per records from [facility] in [place], [place]. Due to crestor   . 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 Normocytic anemia    Lynden Ang, MD  . Obstructive sleep apnea syndrome    Per records from [facility] in [place], [place]. Patient did not tolerate CPAP machine   . Onychomycosis    Per records from [facility] in [place], [place]   . Osteoarthritis    Per [facility] New Patient packet   . Osteopenia    Per records from [facility] in [place], [place].  Treated in the past with antiresorptive therapy  . Renal cyst 10/11/2012   Simple on CT Scan 10/2012. Per records from [facility] in [place], [place]   . 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 Umbilical hernia    Per [facility] New Patient  packet   . Vitamin D deficiency    Lynden Ang, MD    Blood pressure (!) 148/86, pulse 76.  Cardiomyopathy [facility]) Patient with cardiomyopathy and reduced EF at 35-40%.  Will have her start Entresto 49/51 tomorrow, to be sure there are 36 hours from last dose of lisinopril.  She will return in 2 weeks for follow up, dose titration and metabolic panel.  Currently patient takes Xarelto, so hopefully the Sherryll Burger will be at the same copay level.  Patient encouraged to continue with home exercise and dietary modifications.    Phillips Hay PharmD CPP [facility] [address] [place], [place] [address] [phone]

## 2018-03-24 NOTE — Patient Instructions (Signed)
Return for a a follow up appointment in 2 weeks  We will have you get labs drawn when you come back in 2 weeks   Check your blood pressure at home daily and keep record of the readings.  Take your BP meds as follows:  Stop lisinopril.   Start Entresto 49/51 mg twice daily.  START WITH FIRST DOSE ON Saturday.    Bring all of your meds, your BP cuff and your record of home blood pressures to your next appointment.  Exercise as you're able, try to walk approximately 30 minutes per day.  Keep salt intake to a minimum, especially watch canned and prepared boxed foods.  Eat more fresh fruits and vegetables and fewer canned items.  Avoid eating in fast food restaurants.    HOW TO TAKE YOUR BLOOD PRESSURE: . Rest 5 minutes before taking your blood pressure. .  Don't smoke or drink caffeinated beverages for at least 30 minutes before. . Take your blood pressure before (not after) you eat. . Sit comfortably with your back supported and both feet on the floor (don't cross your legs). . Elevate your arm to heart level on a table or a desk. . Use the proper sized cuff. It should fit smoothly and snugly around your bare upper arm. There should be enough room to slip a fingertip under the cuff. The bottom edge of the cuff should be 1 inch above the crease of the elbow. . Ideally, take 3 measurements at one sitting and record the average.

## 2018-04-03 ENCOUNTER — Other Ambulatory Visit: Payer: Self-pay | Admitting: Pharmacist Clinician (PhC)/ Clinical Pharmacy Specialist

## 2018-04-03 MED ORDER — SACUBITRIL-VALSARTAN 49-51 MG PO TABS: 1.0000 | ORAL_TABLET | Freq: Two times a day (BID) | ORAL | 5 refills | Status: DC

## 2018-04-04 ENCOUNTER — Ambulatory Visit: Payer: Medicare Other

## 2018-04-10 ENCOUNTER — Telehealth: Payer: Self-pay | Admitting: Cardiology

## 2018-04-10 MED ORDER — RIVAROXABAN 20 MG PO TABS: 20.0000 mg | ORAL_TABLET | Freq: Every day | ORAL | 5 refills | Status: DC

## 2018-04-10 MED ORDER — ROSUVASTATIN CALCIUM 5 MG PO TABS: 5.0000 mg | ORAL_TABLET | ORAL | 3 refills | Status: DC

## 2018-04-10 MED ORDER — ROSUVASTATIN CALCIUM 5 MG PO TABS: 5.0000 mg | ORAL_TABLET | ORAL | 5 refills | Status: DC

## 2018-04-10 NOTE — Telephone Encounter (Signed)
Age 73, weight 87kg, SCr 0.97, CrCl 13mL/min, ok for Xarelto 20mg  refill, has been sent in.

## 2018-04-10 NOTE — Telephone Encounter (Signed)
°*  STAT* If patient is at the pharmacy, call can be transferred to refill team.   1. Which medications need to be refilled? (please list name of each medication and dose if known)  Rosuvastatin  2. Which pharmacy/location (including street and city if local pharmacy) is medication to be sent to? [place] [phone]  3. Do they need a 30 day or 90 day supply?30 and refills

## 2018-05-02 ENCOUNTER — Telehealth: Payer: Self-pay | Admitting: *Deleted

## 2018-05-02 NOTE — Telephone Encounter (Signed)
[name],NP called patient this morning to follow up. Patient requested a refill on her Xarelto.   Jennifer Mcclain gave me message.  Cardiologist follows patient's Xarelto. Informed patient to Call Cardiologist for refill. She agreed.

## 2018-05-22 ENCOUNTER — Telehealth (HOSPITAL_COMMUNITY): Payer: Self-pay

## 2018-05-22 NOTE — Telephone Encounter (Signed)

## 2018-05-24 ENCOUNTER — Ambulatory Visit (HOSPITAL_COMMUNITY): Payer: Medicare Other | Attending: Cardiovascular Disease

## 2018-05-24 ENCOUNTER — Other Ambulatory Visit: Payer: Self-pay

## 2018-05-24 DIAGNOSIS — R931 Abnormal findings on diagnostic imaging of heart and coronary circulation: Secondary | ICD-10-CM

## 2018-05-24 DIAGNOSIS — I428 Other cardiomyopathies: Secondary | ICD-10-CM

## 2018-05-25 ENCOUNTER — Other Ambulatory Visit: Payer: Self-pay | Admitting: Nurse Practitioner

## 2018-05-25 DIAGNOSIS — I10 Essential (primary) hypertension: Secondary | ICD-10-CM

## 2018-05-25 NOTE — Telephone Encounter (Signed)
Requested medication is not on current medication list

## 2018-06-01 ENCOUNTER — Telehealth: Payer: Self-pay | Admitting: Pharmacist

## 2018-06-01 NOTE — Telephone Encounter (Signed)
Patient still taking Entresto 49/51mg  twice daily. Reports compliance. Denies problems with medication or with co-pay.   Plan to follow up with DR [name] on 06/14/2018.   Unable to assess blood pressure. Home BP cuff nor acurrate due to Afib.

## 2018-06-12 ENCOUNTER — Telehealth: Payer: Self-pay | Admitting: Cardiology

## 2018-06-12 ENCOUNTER — Ambulatory Visit: Payer: Medicare Other | Admitting: Cardiology

## 2018-06-12 NOTE — Telephone Encounter (Signed)
Patient has computer if doctor wants to do Video/ iphone/ consent/ pre reg completed 6

## 2018-06-14 ENCOUNTER — Telehealth (INDEPENDENT_AMBULATORY_CARE_PROVIDER_SITE_OTHER): Payer: Medicare Other | Admitting: Cardiology

## 2018-06-14 ENCOUNTER — Encounter: Payer: Self-pay | Admitting: Cardiology

## 2018-06-14 VITALS — BP 139/96 | HR 83 | Ht 62.0 in | Wt 196.0 lb

## 2018-06-14 DIAGNOSIS — I10 Essential (primary) hypertension: Secondary | ICD-10-CM

## 2018-06-14 DIAGNOSIS — Z7189 Other specified counseling: Secondary | ICD-10-CM

## 2018-06-14 DIAGNOSIS — I428 Other cardiomyopathies: Secondary | ICD-10-CM | POA: Diagnosis not present

## 2018-06-14 DIAGNOSIS — I2510 Atherosclerotic heart disease of native coronary artery without angina pectoris: Secondary | ICD-10-CM

## 2018-06-14 DIAGNOSIS — I482 Chronic atrial fibrillation, unspecified: Secondary | ICD-10-CM | POA: Diagnosis not present

## 2018-06-14 DIAGNOSIS — I2583 Coronary atherosclerosis due to lipid rich plaque: Secondary | ICD-10-CM

## 2018-06-14 DIAGNOSIS — E7800 Pure hypercholesterolemia, unspecified: Secondary | ICD-10-CM | POA: Diagnosis not present

## 2018-06-14 NOTE — Patient Instructions (Signed)

## 2018-06-14 NOTE — Progress Notes (Signed)
Virtual Visit via Video Note   This visit type was conducted due to national recommendations for restrictions regarding the COVID-19 Pandemic (e.g. social distancing) in an effort to limit this patient's exposure and mitigate transmission in our community.  Due to her co-morbid illnesses, this patient is at least at moderate risk for complications without adequate follow up.  This format is felt to be most appropriate for this patient at this time.  All issues noted in this document were discussed and addressed.  A limited physical exam was performed with this format.  Please refer to the patient's chart for her consent to telehealth for [facility].   Date:  06/14/2018   ID:  Jennifer Mcclain, DOB [birth year], MRN [id number]  Patient Location: Home Provider Location: Office  PCP:  [name], NP  Cardiologist:  [name], MD  Electrophysiologist:  None   Evaluation Performed:  Follow-Up Visit  Chief Complaint:  Follow up  History of Present Illness:    Jennifer Mcclain is a 73 y.o. female with  hx of atrial fibrillation, hyperlipidemia, hypertension who is seen for follow up for nonischemic cardiomyopathy. Initial consult with me was on 01/17/18 for atrial fibrillation.  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 Had an ablation done, but was told part of it was too close to the His bundle and so the EP cardiologist ablated all around it but not in the bundle. Moved to [place] about 10 years ago, was told there was no evidence of WPW. She had an episode of afib about 5 years ago that was fast, but has otherwise been well controlled. She can feel when she has atrial fibrillation, usually a few times/day. Doesn't stop her from doing her daily activities. Doesn't think it lasts long. No syncope. No chest pain. Does have some shortness of breath.  Daughter passed away from heart disease; had a kink in one of her arteries, died at age 21.  Since coming to  [facility], had echo with abnormal EF, moderate MR, moderate-severe TR. Cath did not show significant CAD.   The patient does not have symptoms concerning for COVID-19 infection (fever, chills, cough, or new shortness of breath).   Today: She is doing well overall. She has felt much better since starting the entresto. She was doing group exercise classes before COVID, and since then she has been minimally active. She does have to walk up a hill to get to her mailbox, and she has been doing this routinely without issue.  No significant swelling, had some mild ankle edema yesterday. BP reading is from yesterday, is above her normal. She has been running around 130/70s usually. No lightheadedness.   Hasn't noticed her afib as much since starting the entresto. Not sure what rhythm she is in currently, but no symptoms.  Denies chest pain, shortness of breath at rest or with normal exertion. No PND, orthopnea, or unexpected weight gain. No syncope or palpitations.   Past Medical History:  Diagnosis Date  . Achilles tendinitis    Per [facility] New Patient packet   . Atrial fibrillation [facility])    Per records from [facility] in [place], [place]   . Baker's cyst of knee, left    with resulting lower extremity edema  . Benign cyst of breast, left    Per records from [facility] in [place], [place]. Has yearly U/S in the past   . Cerebrovascular disease    Per [facility] New Patient packet   .  Chronic atrial fibrillation    [facility]   . Chronic atrial fibrillation    Evalina Fieldichard Miller, MD  . Decreased hearing, bilateral    referred to audiologist, Per records from [facility] in [place], [place]   . Disorder of bone density and structure, unspecified    Per records from [facility] in [place], [place]   . Disorder of carotid artery (HCC)    Lynden AngMelissa James, MD  . Dyslipidemia    Evalina Fieldichard Miller, MD  . Elevated alkaline phosphatase level    Neg ative ANA  08/2013. Per records from [facility] in [place], [place]   . H/O calculus of kidney during pregnancy    Lynden AngMelissa James, MD  . H/O hysterectomy for benign disease    Lynden AngMelissa James, MD  . History of fibula fracture    Right, Per records from [facility] in [place], [place]   . History of torn meniscus of right knee    Per records from [facility] in [place], [place]   . Humerus fracture    left leg- due to fall  . Humerus fracture    Resulting from a fall, Per records [facility] [place], [place]   . Hypercalcemia    Per records from [facility] [place], [place]   . Hypertension    [facility]   . Kidney stone on left side    Per records [facility] [place], [place]   . Lactose intolerance    [facility]   . Multiple actinic keratoses    Lynden AngMelissa James, MD  . Myalgia due to statin    Per records from [facility] in [place], [place]. Due to crestor   . Non-toxic multinodular goiter 01/21/2017   Lynden AngMelissa James, MD  . Normocytic anemia    Lynden AngMelissa James, MD  . Obstructive sleep apnea syndrome    Per records from [facility] in [place], [place]. Patient did not tolerate CPAP machine   . Onychomycosis    Per records from [facility] in [place], [place]   . Osteoarthritis    [facility]   . Osteopenia    Per records [facility] in [place], [place]. Treated in the past with antiresorptive therapy  . Renal cyst 10/11/2012   Simple on CT Scan 10/2012. Per records [facility] in [place], [place]   . Sensorineural hearing loss (SNHL), bilateral 01/28/2017   Lynden AngMelissa James , MD  . Umbilical hernia    [facility]   . Vitamin D deficiency    Lynden AngMelissa james, MD   Past Surgical History:  Procedure Laterality Date  . ABDOMINAL HYSTERECTOMY  1989   One ovary, [facility]   . ABLATION  1996   Radical,  [facility]   . BREAST CYST ASPIRATION Bilateral    numerous, over several years per pt, always benign  . COLONOSCOPY  2019   Dr.Melissa Fayrene FearingJames, [facility]   . KNEE ARTHROSCOPY W/ MENISCAL REPAIR  2002   [facility]   . REPLACEMENT TOTAL KNEE Left 2015   [facility]  . RIGHT/LEFT HEART CATH AND CORONARY ANGIOGRAPHY N/A 02/07/2018   Procedure: RIGHT/LEFT HEART CATH AND CORONARY ANGIOGRAPHY;  Surgeon: SwazilandJordan, Peter M, MD;  [facility];  Service:  Cardiovascular;  Laterality: N/A;  . TONSILLECTOMY  1951   Per [facility] New Patient packet      Current Meds  Medication Sig  . acetaminophen (TYLENOL) 650 MG CR tablet Take 650 mg by mouth 2 (two) times daily.   Marland Kitchen amiodarone (PACERONE) 200 MG tablet Take 1 tablet (200 mg total) by mouth daily.  . Calcium Carb-Cholecalciferol (CALCIUM 600/VITAMIN D3 PO) Take 1 tablet by mouth 2 (two) times daily.  . Cholecalciferol (VITAMIN D3) 25 MCG (1000 UT) CAPS Take 1,000 Units by mouth daily.   . fluticasone (FLONASE) 50 MCG/ACT nasal spray Place 1 spray into both nostrils daily.  . metoprolol succinate (TOPROL-XL) 50 MG 24 hr tablet Take 50 mg by mouth 2 (two) times daily. Take with or immediately following a meal.  . Multiple Vitamins-Minerals (OCUVITE PO) Take 1 tablet by mouth daily.   . polycarbophil (FIBERCON) 625 MG tablet Take 1,250 mg by mouth daily.  . Probiotic Product (PHILLIPS COLON HEALTH PO) Take 1 capsule by mouth daily.   . rivaroxaban (XARELTO) 20 MG TABS tablet Take 1 tablet (20 mg total) by mouth daily with supper.  . rosuvastatin (CRESTOR) 5 MG tablet Take 1 tablet (5 mg total) by mouth 3 (three) times a week.  . sacubitril-valsartan (ENTRESTO) 49-51 MG Take 1 tablet by mouth 2 (two) times daily.     Allergies:   Adhesive [tape]; Grass extracts [gramineae pollens]; Lactose intolerance (gi); Naprosyn [naproxen]; Coq10 [coenzyme q10]; Latex; and Zocor [simvastatin]    Social History   Tobacco Use  . Smoking status: Never Smoker  . Smokeless tobacco: Never Used  Substance Use Topics  . Alcohol use: Yes    Comment: 1-2 drinks weekly - social   . Drug use: Never     Family Hx: The patient's family history includes AAA (abdominal aortic aneurysm) in her sister; Alzheimer's disease (age of onset: 9) in her father; Arthritis (age of onset: 46) in her mother; Breast cancer in her paternal aunt; CVA in her maternal grandfather; Cancer in her maternal grandfather; Congenital heart disease in her daughter; Congestive Heart Failure in her mother; Dementia in her father; Diabetes in her father and sister; Diabetes (age of onset: 76) in her mother; Diabetes (age of onset: 15) in her maternal grandfather; Heart attack in her maternal aunt; Hyperlipidemia in her father; Lung cancer (age of onset: 66) in her maternal grandmother; Stroke in her father.  ROS:   Please see the history of present illness.    Constitutional: Negative for chills, fever, unintentional weight loss. Does feel warm every night, not sweats per se. HENT: Negative for ear pain and hearing loss.   Eyes: Negative for loss of vision and eye pain.  Respiratory: Negative for cough, sputum, wheezing.   Cardiovascular: See HPI. Gastrointestinal: Negative for abdominal pain, melena, and hematochezia.  Genitourinary: Negative for dysuria and hematuria.  Musculoskeletal: Negative for falls and myalgias.  Skin: Negative for itching and rash.  Neurological: Negative for focal weakness, focal sensory changes and loss of consciousness.  Endo/Heme/Allergies: Does not bruise/bleed easily.  All other systems reviewed and are negative.   Prior CV studies:   The following studies were reviewed today: See summary in HPI Echo 05/24/18 1. The left ventricle has normal systolic function, with an ejection fraction of 55-60%. The cavity size was normal. Left ventricular diastolic Doppler parameters are  indeterminate secondary to atrial fibrillation.  2. The right ventricle has normal systolic function. The cavity was normal. There is no increase in  right ventricular wall thickness.  3. Left atrial size was severely dilated.  4. Mitral valve regurgitation is mild to moderate by color flow Doppler.  5. The aortic valve is tricuspid. No stenosis of the aortic valve. Mild TR  Labs/Other Tests and Data Reviewed:    EKG:  An ECG dated 02/02/18 was personally reviewed today and demonstrated:  afib RVR  Recent Labs: 12/07/2017: ALT 8; TSH 1.74 02/02/2018: BUN 18; Creatinine, Ser 0.97; Platelets 179 02/07/2018: Hemoglobin 11.9; Potassium 3.8; Sodium 138   Recent Lipid Panel Lab Results  Component Value Date/Time   CHOL 200 (H) 12/07/2017 10:04 AM   TRIG 90 12/07/2017 10:04 AM   HDL 92 12/07/2017 10:04 AM   CHOLHDL 2.2 12/07/2017 10:04 AM   LDLCALC 90 12/07/2017 10:04 AM    Wt Readings from Last 3 Encounters:  06/14/18 196 lb (88.9 kg)  03/02/18 192 lb 6.4 oz (87.3 kg)  02/15/18 192 lb (87.1 kg)     Objective:    Vital Signs:  BP (!) 139/96 Comment: Yesterday's BP  Pulse 83   Ht 5\' 2"  (1.575 m)   Wt 196 lb (88.9 kg)   BMI 35.85 kg/m    VITAL SIGNS:  reviewed GEN:  no acute distress EYES:  sclerae anicteric, EOMI - Extraocular Movements Intact RESPIRATORY:  normal respiratory effort, symmetric expansion CARDIOVASCULAR:  no visible JVD SKIN:  no rash, lesions or ulcers. MUSCULOSKELETAL:  no obvious deformities. NEURO:  alert and oriented x 3, no obvious focal deficit PSYCH:  normal affect  ASSESSMENT & PLAN:    Nonischemic cardiomyopathy with reduced EF, moderate MR, and moderate-severe TR: improved on most recent echo. NYHA class II symptoms -cath with minimal nonobstructive CAD -MR more mild-moderate on cath; improved MR and TR on most recent echo -tolerating entresto well. Did discuss that even with improvement in EF, recommendations are to continue entresto given good  response. -continue metoprolol succinate  Atrial fibrillation: was paroxsymal, likely now persistent: Asymptomatic currently, and she has had symptoms with it in the past. Not sure what rhythm she is in, but HR well controlled.  -Recent monitor showed 100% burden of afib.  -feels well on amiodarone -recent TFTs and LFTs WNL. Will check PFTs at follow up.  -CHA2DS2/VAS Stroke Risk Points=3, continue rivaroxaban for anticoagulation  Hypertension: reports good control at home, mildly elevated on most recent numbers. Continue entresto and metoprolol for now  Hyperlipidemia (hypercholesterolemia): on rosuvastatin, LDL 90 without prior history of ASCVD. Had nonobstructive CAD on cath. No aspirin as she is on rivaroxaban, currently tolerating 3x/week rosuvastatin. Goal LDL <70. Readdress at follow up  COVID-19 Education: The signs and symptoms of COVID-19 were discussed with the patient and how to seek care for testing (follow up with PCP or arrange E-visit).  The importance of social distancing was discussed today.  Time:   Today, I have spent 15 minutes with the patient with telehealth technology discussing the above problems.    Patient Instructions  Medication Instructions:  Your Physician recommend you continue on your current medication as directed.    If you need a refill on your cardiac medications before your next appointment, please call your pharmacy.   Lab work: None  Testing/Procedures: None  Follow-Up: At [facility], you and your health needs are our priority.  As part of our continuing mission to provide you with exceptional heart care, we have created designated Provider Care Teams.  These Care Teams include your primary Cardiologist (physician) and Advanced Practice Providers (APPs -  Physician Assistants and Nurse Practitioners) who all work together to provide you with the care you need, when you need it. You will need a follow up appointment in 6 months.  Please  call our office 2 months in advance to schedule this appointment.  You may see [name], MD or one of the following Advanced Practice Providers on your designated Care Team:   Theodore Demark, PA-C . Joni Reining, DNP, ANP      Medication Adjustments/Labs and Tests Ordered: Current medicines are reviewed at length with the patient today.  Concerns regarding medicines are outlined above.   Tests Ordered: No orders of the defined types were placed in this encounter.   Medication Changes: No orders of the defined types were placed in this encounter.   Disposition:  Follow up 6 mos  Signed, [name], MD  06/14/2018 10:59 AM    [facility]

## 2018-08-16 ENCOUNTER — Other Ambulatory Visit: Payer: Self-pay

## 2018-08-16 ENCOUNTER — Ambulatory Visit (INDEPENDENT_AMBULATORY_CARE_PROVIDER_SITE_OTHER): Payer: Medicare Other | Admitting: Nurse Practitioner

## 2018-08-16 ENCOUNTER — Encounter: Payer: Self-pay | Admitting: Nurse Practitioner

## 2018-08-16 VITALS — BP 138/86 | HR 73 | Temp 98.1°F | Ht 62.0 in | Wt 202.0 lb

## 2018-08-16 DIAGNOSIS — I428 Other cardiomyopathies: Secondary | ICD-10-CM | POA: Diagnosis not present

## 2018-08-16 DIAGNOSIS — I2510 Atherosclerotic heart disease of native coronary artery without angina pectoris: Secondary | ICD-10-CM

## 2018-08-16 DIAGNOSIS — E042 Nontoxic multinodular goiter: Secondary | ICD-10-CM | POA: Diagnosis not present

## 2018-08-16 DIAGNOSIS — I482 Chronic atrial fibrillation, unspecified: Secondary | ICD-10-CM | POA: Diagnosis not present

## 2018-08-16 DIAGNOSIS — I10 Essential (primary) hypertension: Secondary | ICD-10-CM | POA: Diagnosis not present

## 2018-08-16 DIAGNOSIS — E7800 Pure hypercholesterolemia, unspecified: Secondary | ICD-10-CM | POA: Diagnosis not present

## 2018-08-16 DIAGNOSIS — I2583 Coronary atherosclerosis due to lipid rich plaque: Secondary | ICD-10-CM | POA: Diagnosis not present

## 2018-08-16 DIAGNOSIS — Z9189 Other specified personal risk factors, not elsewhere classified: Secondary | ICD-10-CM

## 2018-08-16 DIAGNOSIS — M810 Age-related osteoporosis without current pathological fracture: Secondary | ICD-10-CM

## 2018-08-16 DIAGNOSIS — Z1159 Encounter for screening for other viral diseases: Secondary | ICD-10-CM | POA: Diagnosis not present

## 2018-08-16 NOTE — Progress Notes (Signed)
Careteam: Patient Care Team: Sharon SellerEubanks, Danyia Borunda K, NP as PCP - General (Geriatric Medicine) Jodelle Redhristopher, Bridgette, MD as PCP - Cardiology (Cardiology)  Advanced Directive information    Allergies  Allergen Reactions  . Adhesive [Tape]     Skin irritation   . Grass Extracts [Gramineae Pollens]      Per records from from [facility]  . Lactose Intolerance (Gi) Other (See Comments)    Unknown  . Naprosyn [Naproxen] Hives  . Coq10 [Coenzyme Q10] Other (See Comments)    Cramps   . Latex Rash  . Zocor [Simvastatin] Other (See Comments)    cramps    Chief Complaint  Patient presents with  . Medical Management of Chronic Issues    6 month follow-up   . Quality Metric Gaps    Discuss need for colonoscopy   . Immunizations    Discuss need for TDaP   . Best Practice Recommendations    Discuss need for Hep C screening      HPI: Patient is a 73 y.o. female seen in the office today for routine follow up   Non-toxic multinodular goiter- has been going to an endocrinologist who has been monitoring this- was going yearly then every 2 years (went last year)  Hyperlipidemia- taking crestor 5 mg three times a week and tolerates 3 times a week much better than daily Attempts diet modifications.   Hypertension- taking metoprolol succinate 50 mg BID and lisinopril.   A fib- following with cardiology, currently on amiodarone, continues on metoprolol and using xarelto for rate controlled.   Cardiomyopathy- followed by cardiology-reduced EF, moderate MR, and moderate-severe TR: nonischemic cardiomyopathy no swelling, chest pain, or shortness of breath. Was started on entresto- started exercise at the end of feb but due to covid closed. Not doing much activity due to heat.  Has abscessed tooth that is being removed on the 20 th - not hurting currently, using a rinse.   Seasonal allergies- controlled on flonase.   Did cologuard 2 years ago.   [facility]  Had blood transfusion in the 70s  Review of Systems:  Review of Systems  Constitutional: Negative for chills, fever and weight loss.  HENT: Positive for hearing loss. Negative for tinnitus.   Respiratory: Negative for cough, sputum production and shortness of breath.   Cardiovascular: Negative for chest pain, palpitations and leg swelling.  Gastrointestinal: Negative for abdominal pain, constipation, diarrhea and heartburn.  Genitourinary: Negative for dysuria, frequency and urgency.  Musculoskeletal: Negative for back pain, falls, joint pain and myalgias.  Skin: Negative.   Neurological: Negative for dizziness and headaches.  Endo/Heme/Allergies: Positive for environmental allergies.  Psychiatric/Behavioral: Negative for depression and memory loss. The patient does not have insomnia.     Past Medical History:  Diagnosis Date  . Achilles tendinitis    Per [facility]   . Atrial fibrillation [facility](HCC)    Per records from [facility] in [place], [place]   . Baker's cyst of knee, left    with resulting lower extremity edema  . Benign cyst of breast, left    Per records from [facility] in [place], [place]. Has yearly U/S in the past   . Cerebrovascular disease    Per [facility]   . Chronic atrial fibrillation    Per [facility]   . Chronic atrial fibrillation    Evalina Fieldichard Miller, MD  . Decreased hearing, bilateral  referred to audiologist, Per records from [facility] in [place], [place]   . Disorder of bone density and structure, unspecified    Per records from [facility] in [place], [place]   . Disorder of carotid artery (HCC)    Lynden Ang, MD  . Dyslipidemia    [name], MD  . Elevated alkaline phosphatase level    Neg ative ANA 08/2013. Per records from [facility] in [place], [place]   . H/O calculus of kidney during pregnancy    Lynden Ang, MD  . H/O hysterectomy for benign  disease    Lynden Ang, MD  . History of fibula fracture    Right, Per records from [facility] in [place], [place]   . History of torn meniscus of right knee    Per records from [facility] in [place], [place]   . Humerus fracture    left leg- due to fall  . Humerus fracture    Resulting from a fall, Per records from [facility] in [place], [place]   . Hypercalcemia    Per records from [facility] in [place], [place]   . Hypertension    Per [facility] New Patient packet   . Kidney stone on left side    Per records from [facility] in [place], [place]   . Lactose intolerance    Per [facility] New Patient packet   . Multiple actinic keratoses    Lynden Ang, MD  . Myalgia due to statin    Per records from [facility] in [place], [place]. Due to crestor   . 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 Normocytic anemia    Lynden Ang, MD  . Obstructive sleep apnea syndrome    Per records from [facility] in [place], [place]. Patient did not tolerate CPAP machine   . Onychomycosis    Per records from [facility] in [place], [place]   . Osteoarthritis    Per [facility] New Patient packet   . Osteopenia    Per records from [facility] in [place], [place]. Treated in the past with antiresorptive therapy  . Renal cyst 10/11/2012   Simple on CT Scan 10/2012. Per records from [facility] in [place], [place]   . 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 Umbilical hernia    Per [facility] New Patient packet   . Vitamin D deficiency    Lynden Ang, MD   Past Surgical History:  Procedure Laterality Date  . ABDOMINAL HYSTERECTOMY  1989   One ovary, Per [facility] New Patient packet   . ABLATION  1996   Radical, Per [facility] New Patient packet   . BREAST CYST ASPIRATION Bilateral    numerous, over several years per pt, always benign  . COLONOSCOPY  2019   Dr.Melissa Fayrene Fearing, Per  [facility] New Patient packet   . KNEE ARTHROSCOPY W/ MENISCAL REPAIR  2002   Per [facility] New Patient packet   . REPLACEMENT TOTAL KNEE Left 2015   Per [facility] New Patient packet  . RIGHT/LEFT HEART CATH AND CORONARY ANGIOGRAPHY N/A 02/07/2018   Procedure: RIGHT/LEFT HEART CATH AND CORONARY ANGIOGRAPHY;  Surgeon: Swazil[place], Peter M, MD;  Location: [facility] INVASIVE CV LAB;  Service: Cardiovascular;  Laterality: N/A;  . TONSILLECTOMY  1951   Per [facility] New Patient packet    Social History:   reports that she has never smoked. She has  never used smokeless tobacco. She reports current alcohol use. She reports that she does not use drugs.  Family History  Problem Relation Age of Onset  . Congestive Heart Failure Mother   . Diabetes Mother [id number]  . Arthritis Mother 5150  . Stroke Father   . Dementia Father   . Diabetes Father   . Hyperlipidemia Father   . Alzheimer's disease Father 482  . Diabetes Sister   . Cancer Maternal Grandfather   . Diabetes Maternal Grandfather 60  . CVA Maternal Grandfather   . AAA (abdominal aortic aneurysm) Sister   . Congenital heart disease Daughter   . Heart attack Maternal Aunt   . Lung cancer Maternal Grandmother 80  . Breast cancer Paternal Aunt     Medications: Patient's Medications  New Prescriptions   No medications on file  Previous Medications   ACETAMINOPHEN (TYLENOL) 650 MG CR TABLET    Take 650 mg by mouth 2 (two) times daily.    AMIODARONE (PACERONE) 200 MG TABLET    Take 1 tablet (200 mg total) by mouth daily.   CALCIUM CARB-CHOLECALCIFEROL (CALCIUM 600/VITAMIN D3 PO)    Take 1 tablet by mouth 2 (two) times daily.   CHOLECALCIFEROL (VITAMIN D3) 25 MCG (1000 UT) CAPS    Take 1,000 Units by mouth daily.    FLUTICASONE (FLONASE) 50 MCG/ACT NASAL SPRAY    Place 1 spray into both nostrils daily.   METOPROLOL SUCCINATE (TOPROL-XL) 50 MG 24 HR TABLET    Take 50 mg by mouth 2 (two) times daily. Take with or immediately following a meal.   MULTIPLE VITAMINS-MINERALS (OCUVITE  PO)    Take 1 tablet by mouth daily.    POLYCARBOPHIL (FIBERCON) 625 MG TABLET    Take 1,250 mg by mouth daily.   PROBIOTIC PRODUCT (PHILLIPS COLON HEALTH PO)    Take 1 capsule by mouth daily.    RIVAROXABAN (XARELTO) 20 MG TABS TABLET    Take 1 tablet (20 mg total) by mouth daily with supper.   ROSUVASTATIN (CRESTOR) 5 MG TABLET    Take 1 tablet (5 mg total) by mouth 3 (three) times a week.   SACUBITRIL-VALSARTAN (ENTRESTO) 49-51 MG    Take 1 tablet by mouth 2 (two) times daily.  Modified Medications   No medications on file  Discontinued Medications   No medications on file    Physical Exam:  Vitals:   08/16/18 1050  BP: 138/86  Pulse: 73  Temp: 98.1 F (36.7 C)  TempSrc: Oral  SpO2: 97%  Weight: 202 lb (91.6 kg)  Height: 5\' 2"  (1.575 m)   Body mass index is 36.95 kg/m. Wt Readings from Last 3 Encounters:  08/16/18 202 lb (91.6 kg)  06/14/18 196 lb (88.9 kg)  03/02/18 192 lb 6.4 oz (87.3 kg)    Physical Exam Constitutional:      General: She is not in acute distress.    Appearance: She is well-developed. She is not diaphoretic.  HENT:     Head: Normocephalic and atraumatic.  Eyes:     Conjunctiva/sclera: Conjunctivae normal.     Pupils: Pupils are equal, round, and reactive to light.  Neck:     Musculoskeletal: Normal range of motion and neck supple.     Thyroid: Thyromegaly present.  Cardiovascular:     Rate and Rhythm: Normal rate. Rhythm irregular.     Heart sounds: Normal heart sounds.  Pulmonary:     Effort: Pulmonary effort is normal.     Breath  sounds: Normal breath sounds.  Chest:     Breasts:        Right: Normal.        Left: Normal.  Abdominal:     General: Bowel sounds are normal. There is no distension.     Palpations: Abdomen is soft.     Tenderness: There is no abdominal tenderness.  Musculoskeletal: Normal range of motion.        General: No tenderness.  Skin:    General: Skin is warm and dry.  Neurological:     Mental Status: She is  alert and oriented to person, place, and time.  Psychiatric:        Mood and Affect: Mood normal.     Labs reviewed: Basic Metabolic Panel: Recent Labs    12/07/17 1004 02/02/18 0949 02/07/18 0810 02/07/18 0816  NA 140 138 139 138  K 4.3 4.5 3.7 3.8  CL 103 99  --   --   CO2 28 25  --   --   GLUCOSE 95 78  --   --   BUN 14 18  --   --   CREATININE 0.91 0.97  --   --   CALCIUM 9.6 10.0  --   --   TSH 1.74  --   --   --    Liver Function Tests: Recent Labs    12/07/17 1004  AST 17  ALT 8  BILITOT 0.6  PROT 6.8   No results for input(s): LIPASE, AMYLASE in the last 8760 hours. No results for input(s): AMMONIA in the last 8760 hours. CBC: Recent Labs    12/07/17 1004 02/02/18 0949 02/07/18 0810 02/07/18 0816  WBC 4.2 5.7  --   --   NEUTROABS 2,272  --   --   --   HGB 12.8 12.8 12.2 11.9*  HCT 39.1 38.6 36.0 35.0*  MCV 94.4 96  --   --   PLT 181 179  --   --    Lipid Panel: Recent Labs    12/07/17 1004  CHOL 200*  HDL 92  LDLCALC 90  TRIG 90  CHOLHDL 2.2   TSH: Recent Labs    12/07/17 1004  TSH 1.74   A1C: Lab Results  Component Value Date   HGBA1C 5.5 01/28/2017     Assessment/Plan 1. Encounter for hepatitis C virus screening test for high risk patient Had blood transfusion in the 70s after hysterectomy  - Hepatitis C antibody  2. Coronary artery disease due to lipid rich plaque Stable, ongoing follow up with cardiology, without chest pains, continues   3. Pure hypercholesterolemia Continues on crestor 5 mg three times weekly, encouraged dietary modifications.  - Lipid panel - COMPLETE METABOLIC PANEL WITH GFR  4. Nonischemic cardiomyopathy ([place]) -stated on entresto per cardiologist, continues on metoprolol. Without signs of fluid overload.   5. Essential hypertension -stable on current regimen. - COMPLETE METABOLIC PANEL WITH GFR  6. Chronic atrial fibrillation Rate controlled, continues on amiodarone and metoprolol with  xarelto for anticoagulation.  - CBC with Differential/Platelet  7. Osteoporosis without current pathological fracture, unspecified osteoporosis type -completed fosamax in the past, due for dexa 2021. Continues on cal and vit d.  8. Non-toxic multinodular goiter Followed by endocrine   Next appt: 6 months Yumna Ebers K. King, [facility] [phone]

## 2018-08-17 ENCOUNTER — Other Ambulatory Visit: Payer: Self-pay | Admitting: Nurse Practitioner

## 2018-08-17 DIAGNOSIS — R768 Other specified abnormal immunological findings in serum: Secondary | ICD-10-CM

## 2018-08-17 NOTE — Addendum Note (Signed)
Addended by: Lauree Chandler on: 08/17/2018 01:15 PM   Modules accepted: Orders

## 2018-08-18 ENCOUNTER — Other Ambulatory Visit: Payer: Self-pay

## 2018-08-22 ENCOUNTER — Other Ambulatory Visit: Payer: Self-pay

## 2018-08-22 ENCOUNTER — Other Ambulatory Visit: Payer: Medicare Other

## 2018-08-22 DIAGNOSIS — R768 Other specified abnormal immunological findings in serum: Secondary | ICD-10-CM

## 2018-08-22 LAB — COMPLETE METABOLIC PANEL WITH GFR
AG Ratio: 1.6 (calc) (ref 1.0–2.5)
ALT: 8 U/L (ref 6–29)
AST: 14 U/L (ref 10–35)
Albumin: 4.2 g/dL (ref 3.6–5.1)
Alkaline phosphatase (APISO): 60 U/L (ref 37–153)
BUN/Creatinine Ratio: 22 (calc) (ref 6–22)
BUN: 22 mg/dL (ref 7–25)
CO2: 30 mmol/L (ref 20–32)
Calcium: 10 mg/dL (ref 8.6–10.4)
Chloride: 102 mmol/L (ref 98–110)
Creat: 1.01 mg/dL — ABNORMAL HIGH (ref 0.60–0.93)
GFR, Est African American: 65 mL/min/{1.73_m2} (ref >=60)
GFR, Est Non African American: 56 mL/min/{1.73_m2} — ABNORMAL LOW (ref >=60)
Globulin: 2.7 g/dL (calc) (ref 1.9–3.7)
Glucose, Bld: 95 mg/dL (ref 65–139)
Potassium: 4.3 mmol/L (ref 3.5–5.3)
Sodium: 139 mmol/L (ref 135–146)
Total Bilirubin: 0.5 mg/dL (ref 0.2–1.2)
Total Protein: 6.9 g/dL (ref 6.1–8.1)

## 2018-08-22 LAB — CBC WITH DIFFERENTIAL/PLATELET
Absolute Monocytes: 423 cells/uL (ref 200–950)
Basophils Absolute: 28 cells/uL (ref 0–200)
Basophils Relative: 0.6 %
Eosinophils Absolute: 42 cells/uL (ref 15–500)
Eosinophils Relative: 0.9 %
HCT: 40.5 % (ref 35.0–45.0)
Hemoglobin: 13.4 g/dL (ref 11.7–15.5)
Lymphs Abs: 1269 cells/uL (ref 850–3900)
MCH: 32.7 pg (ref 27.0–33.0)
MCHC: 33.1 g/dL (ref 32.0–36.0)
MCV: 98.8 fL (ref 80.0–100.0)
MPV: 12.1 fL (ref 7.5–12.5)
Monocytes Relative: 9 %
Neutro Abs: 2938 cells/uL (ref 1500–7800)
Neutrophils Relative %: 62.5 %
Platelets: 190 10*3/uL (ref 140–400)
RBC: 4.1 10*6/uL (ref 3.80–5.10)
RDW: 12.3 % (ref 11.0–15.0)
Total Lymphocyte: 27 %
WBC: 4.7 10*3/uL (ref 3.8–10.8)

## 2018-08-22 LAB — HEPATITIS C ANTIBODY
Hepatitis C Ab: REACTIVE — AB
SIGNAL TO CUT-OFF: 1 — ABNORMAL HIGH (ref <1.00)

## 2018-08-22 LAB — LIPID PANEL
Cholesterol: 224 mg/dL — ABNORMAL HIGH (ref <200)
HDL: 96 mg/dL (ref >=50)
LDL Cholesterol (Calc): 111 mg/dL (calc) — ABNORMAL HIGH
Non-HDL Cholesterol (Calc): 128 mg/dL (calc) (ref <130)
Total CHOL/HDL Ratio: 2.3 (calc) (ref <5.0)
Triglycerides: 78 mg/dL (ref <150)

## 2018-08-22 LAB — HCV RNA,QUANTITATIVE REAL TIME PCR
HCV Quantitative Log: <1.18 Log IU/mL — AB
HCV RNA, PCR, QN: <15 IU/mL — AB

## 2018-08-26 LAB — HEPATITIS C ANTIBODY
Hepatitis C Ab: REACTIVE — AB
SIGNAL TO CUT-OFF: 1.03 — ABNORMAL HIGH (ref <1.00)

## 2018-08-26 LAB — HEPATITIS B SURFACE ANTIBODY,QUALITATIVE: Hep B S Ab: NONREACTIVE

## 2018-08-26 LAB — HEPATITIS C RNA QUANTITATIVE
HCV Quantitative Log: <1.18 Log IU/mL
HCV RNA, PCR, QN: <15 IU/mL

## 2018-08-26 LAB — HEPATITIS A ANTIBODY, TOTAL: Hepatitis A AB,Total: NONREACTIVE

## 2018-08-26 LAB — HEPATITIS B SURFACE ANTIGEN: Hepatitis B Surface Ag: NONREACTIVE

## 2018-09-14 ENCOUNTER — Telehealth: Payer: Self-pay | Admitting: Pharmacy Technician

## 2018-09-14 ENCOUNTER — Encounter: Payer: Self-pay | Admitting: Family

## 2018-09-14 ENCOUNTER — Ambulatory Visit (INDEPENDENT_AMBULATORY_CARE_PROVIDER_SITE_OTHER): Payer: Medicare Other | Admitting: Family

## 2018-09-14 ENCOUNTER — Other Ambulatory Visit: Payer: Self-pay

## 2018-09-14 VITALS — BP 136/69 | HR 76 | Temp 98.4°F

## 2018-09-14 DIAGNOSIS — I2510 Atherosclerotic heart disease of native coronary artery without angina pectoris: Secondary | ICD-10-CM | POA: Diagnosis not present

## 2018-09-14 DIAGNOSIS — I2583 Coronary atherosclerosis due to lipid rich plaque: Secondary | ICD-10-CM

## 2018-09-14 DIAGNOSIS — Z23 Encounter for immunization: Secondary | ICD-10-CM | POA: Diagnosis not present

## 2018-09-14 DIAGNOSIS — R768 Other specified abnormal immunological findings in serum: Secondary | ICD-10-CM

## 2018-09-14 NOTE — Progress Notes (Signed)
Subjective:    Patient ID: Jennifer Mcclain, female    DOB: [birth year], 73 y.o.   MRN: [id number]  Chief Complaint  Patient presents with  . Hepatitis C    HPI:  Jennifer Mcclain is a 73 y.o. female with significant history for cardiovascular disease who has been referred for evaluation and treatment of Hepatitis C.   Jennifer Mcclain was seen by her PCP office on 08/16/2018 and was screened for Hepatitis C as she had concerns regarding a previous blood transfusion she received in the 1970s and is also born between [birth year]. Blood work obtained showed a positive Hepatitis C antibody. Hepatitis C RNA levels were not detected. This was once again repeated on 08/22/2018 with a similar result. She has never been treated for Hepatitis C in the past. No personal or family history of liver disease and is currently asymptomatic.   She is requesting a high dose influenza vaccination today.    Allergies  Allergen Reactions  . Adhesive [Tape]     Skin irritation   . Grass Extracts [Gramineae Pollens]      Per records from from [facility]  . Lactose Intolerance (Gi) Other (See Comments)    Unknown  . Naprosyn [Naproxen] Hives  . Coq10 [Coenzyme Q10] Other (See Comments)    Cramps   . Latex Rash  . Zocor [Simvastatin] Other (See Comments)    cramps      Outpatient Medications Prior to Visit  Medication Sig Dispense Refill  . acetaminophen (TYLENOL) 650 MG CR tablet Take 650 mg by mouth 2 (two) times daily.     Marland Kitchen amiodarone (PACERONE) 200 MG tablet Take 1 tablet (200 mg total) by mouth daily. 90 tablet 3  . Calcium Carb-Cholecalciferol (CALCIUM 600/VITAMIN D3 PO) Take 1 tablet by mouth 2 (two) times daily.    . Cholecalciferol (VITAMIN D3) 25 MCG (1000 UT) CAPS Take 1,000 Units by mouth daily.     . fluticasone (FLONASE) 50 MCG/ACT nasal spray Place 1 spray into both nostrils daily.    . metoprolol succinate (TOPROL-XL) 50 MG 24 hr tablet Take 50 mg by mouth 2 (two) times daily.  Take with or immediately following a meal.    . Multiple Vitamins-Minerals (OCUVITE PO) Take 1 tablet by mouth daily.     . polycarbophil (FIBERCON) 625 MG tablet Take 1,250 mg by mouth daily.    . Probiotic Product (PHILLIPS COLON HEALTH PO) Take 1 capsule by mouth daily.     . rivaroxaban (XARELTO) 20 MG TABS tablet Take 1 tablet (20 mg total) by mouth daily with supper. 30 tablet 5  . rosuvastatin (CRESTOR) 5 MG tablet Take 1 tablet (5 mg total) by mouth 4 (four) times a week.    . sacubitril-valsartan (ENTRESTO) 49-51 MG Take 1 tablet by mouth 2 (two) times daily. 60 tablet 5   No facility-administered medications prior to visit.      Past Medical History:  Diagnosis Date  . Achilles tendinitis    Per [facility] New Patient packet   . Atrial fibrillation [facility])    Per records from [facility] in [place], [place]   . Baker's cyst of knee, left    with resulting lower extremity edema  . Benign cyst of breast, left    Per records from [facility] in [place], [place]. Has yearly U/S in the past   . Cerebrovascular disease    Per [facility] New Patient packet   . Chronic atrial fibrillation  [facility]   . Chronic atrial fibrillation    Jennifer Fieldichard Miller, MD  . Decreased hearing, bilateral    referred to audiologist, Per records from [facility] in [place], [place]   . Disorder of bone density and structure, unspecified    Per records from [facility] in [place], [place]   . Disorder of carotid artery (HCC)    Jennifer AngMelissa James, MD  . Dyslipidemia    Jennifer Fieldichard Miller, MD  . Elevated alkaline phosphatase level    Neg ative ANA 08/2013. Per records from [facility] in [place], [place]   . H/O calculus of kidney during pregnancy    Jennifer AngMelissa James, MD  . H/O hysterectomy for benign disease    Jennifer AngMelissa James, MD  . History of fibula fracture    Right, Per records from [facility] in [place], [place]   . History of torn meniscus of right  knee    Per [facility] in [place], [place]   . Humerus fracture    left leg- due to fall  . Humerus fracture    Resulting from a fall, Per records from [facility] in [place], [place]   . Hypercalcemia    Per records from [facility] in [place], [place]   . Hypertension    [facility]   . Kidney stone on left side    Per records from [facility] in [place], [place]   . Lactose intolerance    [facility]   . Multiple actinic keratoses    Jennifer AngMelissa James, MD  . Myalgia due to statin    Per records from [facility] in [place], [place]. Due to crestor   . Non-toxic multinodular goiter 01/21/2017   Jennifer AngMelissa James, MD  . Normocytic anemia    Jennifer AngMelissa James, MD  . Obstructive sleep apnea syndrome    Per records from [facility] in [place], [place]. Patient did not tolerate CPAP machine   . Onychomycosis    Per records from [facility] in [place], [place]   . Osteoarthritis    [facility]   . Osteopenia    Per records from [facility] in [place], [place]. Treated in the past with antiresorptive therapy  . Renal cyst 10/11/2012   Simple on CT Scan 10/2012. Per records from [facility] in [place], [place]   . Sensorineural hearing loss (SNHL), bilateral 01/28/2017   Jennifer Mcclain , MD  . Umbilical hernia    Per [facility]   . Vitamin D deficiency    Jennifer AngMelissa james, MD      Past Surgical History:  Procedure Laterality Date  . ABDOMINAL HYSTERECTOMY  1989   One ovary, [facility]   . ABLATION  1996   Radical, [facility]   . BREAST CYST ASPIRATION Bilateral    numerous, over several years per pt, always benign  . COLONOSCOPY  2019   Dr.Melissa Fayrene FearingJames, [facility]   . KNEE ARTHROSCOPY W/ MENISCAL REPAIR  2002   [facility]   . REPLACEMENT TOTAL KNEE Left 2015   Per [facility] New [facility]  packet  . RIGHT/LEFT HEART CATH AND CORONARY ANGIOGRAPHY N/A 02/07/2018   Procedure: RIGHT/LEFT HEART CATH AND CORONARY ANGIOGRAPHY;  Surgeon: SwazilandJordan, Peter M, MD;  [facility];  Service: Cardiovascular;  Laterality:  N/A;  . TONSILLECTOMY  1951   Per [facility] New Patient packet       Family History  Problem Relation Age of Onset  . Congestive Heart Failure Mother   . Diabetes Mother 69  . Arthritis Mother 73  . Stroke Father   . Dementia Father   . Diabetes Father   . Hyperlipidemia Father   . Alzheimer's disease Father 39  . Diabetes Sister   . Cancer Maternal Grandfather   . Diabetes Maternal Grandfather 60  . CVA Maternal Grandfather   . AAA (abdominal aortic aneurysm) Sister   . Congenital heart disease Daughter   . Heart attack Maternal Aunt   . Lung cancer Maternal Grandmother 80  . Breast cancer Paternal Aunt       Social History   Socioeconomic History  . Marital status: Married    Spouse name: Not on file  . Number of children: Not on file  . Years of education: Not on file  . Highest education level: Not on file  Occupational History  . Not on file  Social Needs  . Financial resource strain: Not on file  . Food insecurity    Worry: Not on file    Inability: Not on file  . Transportation needs    Medical: Not on file    Non-medical: Not on file  Tobacco Use  . Smoking status: Never Smoker  . Smokeless tobacco: Never Used  Substance and Sexual Activity  . Alcohol use: Yes    Comment: 1-2 drinks weekly - social   . Drug use: Never  . Sexual activity: Not on file  Lifestyle  . Physical activity    Days per week: Not on file    Minutes per session: Not on file  . Stress: Not on file  Relationships  . Social Musician on phone: Not on file    Gets together: Not on file    Attends religious service: Not on file    Active member of club or organization: Not on file    Attends meetings of clubs or organizations: Not on file     Relationship status: Not on file  . Intimate partner violence    Fear of current or ex partner: Not on file    Emotionally abused: Not on file    Physically abused: Not on file    Forced sexual activity: Not on file  Other Topics Concern  . 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 No   Do you have difficulty preparing food or eating? No   Do you have difficulty managing your medications? No   Do you have difficulty managing your finances? No   Do you have difficulty  affording your medications? No      Review of Systems  Constitutional: Negative for chills, fatigue, fever and unexpected weight change.  Respiratory: Negative for cough, chest tightness, shortness of breath and wheezing.   Cardiovascular: Negative for chest pain and leg swelling.  Gastrointestinal: Negative for abdominal distention, constipation, diarrhea, nausea and vomiting.  Neurological: Negative for dizziness, weakness, light-headedness and headaches.  Hematological: Does not bruise/bleed easily.       Objective:    BP 136/69   Pulse 76   Temp 98.4 F (36.9 C)  Nursing note and vital signs reviewed.  Physical Exam Constitutional:      General: She is not in acute distress.    Appearance: She is well-developed.  Cardiovascular:     Rate and Rhythm: Normal rate and regular rhythm.     Heart sounds: Normal heart sounds. No murmur. No friction rub. No gallop.    Pulmonary:     Effort: Pulmonary effort is normal. No respiratory distress.     Breath sounds: Normal breath sounds. No wheezing or rales.  Chest:     Chest wall: No tenderness.  Abdominal:     General: Bowel sounds are normal. There is no distension.     Palpations: Abdomen is soft. There is no mass.     Tenderness: There is no abdominal tenderness. There is no guarding or rebound.  Skin:    General: Skin is warm and dry.  Neurological:     Mental Status: She is alert and oriented to person, place, and time.  Psychiatric:        Behavior: Behavior normal.        Thought Content: Thought content normal.        Judgment: Judgment normal.         Assessment & Plan:   Patient Active Problem List   Diagnosis Date Noted  . Positive 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 09/15/2018  . Pure hypercholesterolemia 06/14/2018  . Coronary artery disease due to lipid rich plaque 03/02/2018  . Systolic dysfunction without heart failure 02/02/2018  . Nonischemic cardiomyopathy (HCC) 02/02/2018  . Nonrheumatic mitral valve regurgitation 02/02/2018  . Nonrheumatic tricuspid valve regurgitation 02/02/2018  . Osteopenia 02/22/2017  . Bilateral sensorineural hearing loss 01/28/2017  . Vitamin D deficiency 01/21/2017  . Osteoarthritis of knee 01/21/2017  . Non-toxic multinodular goiter 01/21/2017  . IFG (impaired fasting glucose) 01/21/2017  . Lactose intolerance 01/21/2017  . History of hysterectomy for benign disease 01/21/2017  . Multiple actinic keratoses 01/21/2017  . Normocytic anemia 01/21/2017  . Umbilical hernia 01/21/2017  . Essential hypertension 11/30/2016  . Dyslipidemia 11/30/2016  . Chronic atrial fibrillation 11/30/2016  . Obstructive sleep apnea syndrome 11/30/2016  . Renal cyst 10/11/2012     Problem List Items Addressed This Visit      Other   Positive 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 - Primary    Jennifer Mcclain is a 73 year old female with positive Hepatitis C antibody test and no  detectable Hepatitis C RNA level indicating that she has had Hepatitis C in the past and is likely amongst the 25% of individuals who will clear Hepatitis C without treatment.  We discussed the pathophysiology, transmission, prevention and treatments for Hepatitis C. We also discussed that she does not need treatment but can become infected through the means we discussed. The Hepatitis C antibody test will always remain positive and for screening purposes in the future will need to have a Hepatitis C RNA level checked if there is  concern for infection. Happy to see her back as needed.        Other Visit Diagnoses    Need for influenza vaccination       Relevant Orders   Flu Vaccine QUAD High Dose(Fluad) (Completed)       I am having Jennifer L. Dowler "Bobbie" maintain her Multiple Vitamins-Minerals (OCUVITE PO), Probiotic Product ([place]), Vitamin D3, polycarbophil, acetaminophen, amiodarone, Calcium Carb-Cholecalciferol (CALCIUM 600/VITAMIN D3 PO), metoprolol succinate, fluticasone, sacubitril-valsartan, rivaroxaban, and rosuvastatin.   Follow-up: Return if symptoms worsen or fail to improve.    Terri Piedra, MSN, FNP-C Nurse Practitioner [facility] Office phone: [phone] Pager: [place] number: [phone]

## 2018-09-14 NOTE — Patient Instructions (Signed)
Nice to meet you.  You do not have a viral load indicating that you likely cleared the virus independently. No treatment is necessary.  If you have concern for Hepatitis C in the future please be sure to check your Hepatitis C RNA level as the Hepatitis C antibody will always be positive.  Please let us know if you have any questions.  No follow up is necessary.

## 2018-09-14 NOTE — Telephone Encounter (Signed)
[facility] Patient Advocate Encounter  Insurance verification completed.    The patient is insured through [employer].  We will continue to follow to see if copay assistance is needed.  Eyoel Throgmorton E. Barron Vanloan, CPhT [facility] Phone: [phone] Fax:  [phone]   

## 2018-09-15 DIAGNOSIS — R768 Other specified abnormal immunological findings in serum: Secondary | ICD-10-CM | POA: Insufficient documentation

## 2018-09-15 NOTE — Assessment & Plan Note (Signed)
Ms. Cimino is a 73 year old female with positive Hepatitis C antibody test and no detectable Hepatitis C RNA level indicating that she has had Hepatitis C in the past and is likely amongst the 25% of individuals who will clear Hepatitis C without treatment.  We discussed the pathophysiology, transmission, prevention and treatments for Hepatitis C. We also discussed that she does not need treatment but can become infected through the means we discussed. The Hepatitis C antibody test will always remain positive and for screening purposes in the future will need to have a Hepatitis C RNA level checked if there is concern for infection. Happy to see her back as needed.

## 2018-09-25 ENCOUNTER — Other Ambulatory Visit: Payer: Self-pay | Admitting: *Deleted

## 2018-09-26 MED ORDER — ROSUVASTATIN CALCIUM 5 MG PO TABS: 5.0000 mg | ORAL_TABLET | ORAL | 2 refills | Status: DC

## 2018-10-02 ENCOUNTER — Other Ambulatory Visit: Payer: Self-pay | Admitting: Cardiology

## 2018-10-13 ENCOUNTER — Other Ambulatory Visit: Payer: Self-pay | Admitting: Cardiology

## 2018-12-18 ENCOUNTER — Telehealth: Payer: Self-pay | Admitting: *Deleted

## 2018-12-18 ENCOUNTER — Encounter: Payer: Self-pay | Admitting: Cardiology

## 2018-12-18 ENCOUNTER — Telehealth (INDEPENDENT_AMBULATORY_CARE_PROVIDER_SITE_OTHER): Payer: Medicare Other | Admitting: Cardiology

## 2018-12-18 VITALS — BP 137/89 | HR 75 | Ht 62.0 in | Wt 205.8 lb

## 2018-12-18 DIAGNOSIS — I428 Other cardiomyopathies: Secondary | ICD-10-CM

## 2018-12-18 DIAGNOSIS — I2583 Coronary atherosclerosis due to lipid rich plaque: Secondary | ICD-10-CM | POA: Diagnosis not present

## 2018-12-18 DIAGNOSIS — I10 Essential (primary) hypertension: Secondary | ICD-10-CM

## 2018-12-18 DIAGNOSIS — I482 Chronic atrial fibrillation, unspecified: Secondary | ICD-10-CM | POA: Diagnosis not present

## 2018-12-18 DIAGNOSIS — I2510 Atherosclerotic heart disease of native coronary artery without angina pectoris: Secondary | ICD-10-CM

## 2018-12-18 DIAGNOSIS — E7800 Pure hypercholesterolemia, unspecified: Secondary | ICD-10-CM | POA: Diagnosis not present

## 2018-12-18 NOTE — Telephone Encounter (Signed)
7 day ZIO XT long term holter monitor to be mailed to the patients home.

## 2018-12-18 NOTE — Progress Notes (Signed)
Virtual Visit via Video Note   This visit type was conducted due to national recommendations for restrictions regarding the COVID-19 Pandemic (e.g. social distancing) in an effort to limit this patient's exposure and mitigate transmission in our community.  Due to her co-morbid illnesses, this patient is at least at moderate risk for complications without adequate follow up.  This format is felt to be most appropriate for this patient at this time.  All issues noted in this document were discussed and addressed.  A limited physical exam was performed with this format.  Please refer to the patient's chart for her consent to telehealth for [facility].   Attempted video visit, had technical limitations, converted to phone visit.  Date:  12/18/2018   ID:  Jennifer Mcclain, DOB 09-06-1945, MRN [id number]  Patient Location: Home Provider Location: Home  PCP:  [name], NP  Cardiologist:  [name], Mcclain  Electrophysiologist:  None   Evaluation Performed:  Follow-Up Visit  Chief Complaint:  Follow up  History of Present Illness:    Jennifer Mcclain is a 73 y.o. female withhx of atrial fibrillation, hyperlipidemia, hypertension who is seen for follow up for nonischemic cardiomyopathy. Initial consult with me was on 01/17/18 for atrial fibrillation.  The patient does not have symptoms concerning for COVID-19 infection (fever, chills, cough, or new shortness of breath).   Today: Doing well overall. Checking BP/HR at home. 137/89, HR 75 today. 138/96, HR 57 yesterday. Has felt afib more frequently than recent times. Reviewed that monitor earlier this year showed 100% burden of afib. She is feeling a few times a week at most. Discussed repeat monitor, as this will help [place] with next steps. We discussed that if she is in afib 100% of the time on amiodarone, this is not an ideal rhythm control strategy. At that point, will need to discuss alternative rhythm control strategy  vs. Rate control.  Has not been exercising much at home, working on increasing this. She has noted weight gain but believes this is the reason why.  Occasional LE edema after walking, but no significant edema otherwise. Denies chest pain, shortness of breath at rest or with normal exertion. No PND, orthopnea, or unexpected weight gain. No syncope.   Past Medical History:  Diagnosis Date   Achilles tendinitis    Per [facility] New Patient packet    Atrial fibrillation [facility])    Per records from [facility] in [place], [place]    Baker's cyst of knee, left    with resulting lower extremity edema   Benign cyst of breast, left    Per records from [facility] in [place], [place]. 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 Per records from [facility] in [place], [place]    H/O calculus of kidney  during pregnancy    Jennifer Mcclain   H/O hysterectomy for benign disease    Jennifer Mcclain   History of fibula fracture    Right, Per records from [facility] in [place], [place]    History of torn meniscus of right knee    Per records from [facility] in [place], [place]    Humerus fracture    left leg- due to fall   Humerus fracture    Resulting from a fall, Per records from [facility] in [place], [place]    Hypercalcemia    Per records from [facility] in  [place], [place]    Hypertension    Per [facility] New Patient packet    Kidney stone on left side    Per records from [facility] in [place], [place]    Lactose intolerance    Per [facility] New Patient packet    Multiple actinic keratoses    Jennifer Mcclain   Myalgia due to statin    Per records from [facility] in [place], [place]. Due to crestor    Non-toxic multinodular goiter 01/21/2017   Jennifer Mcclain   Normocytic anemia    Jennifer Mcclain   Obstructive sleep apnea syndrome    Per records from [facility] in [place], [place]. Patient did not tolerate CPAP machine    Onychomycosis    Per records from [facility] in [place], [place]    Per [facility] New Patient packet    Osteopenia    Per records from [facility] in [place], [place]. Treated in the past with antiresorptive therapy   Renal cyst 10/11/2012   Simple on CT Scan 10/2012. 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   Jennifer Cloud , Mcclain   Umbilical hernia    Per [facility] New Patient packet    Vitamin D deficiency    Jennifer Mcclain   Past Surgical History:  Procedure Laterality Date   ABDOMINAL HYSTERECTOMY  1989   One ovary, Per [place] New Patient packet    ABLATION  1996   Radical, Per [facility] New Patient packet    BREAST CYST ASPIRATION Bilateral    numerous, over several years per pt, always benign   COLONOSCOPY  2019   Dr.Melissa Jeneen Rinks, Per [facility] New Patient packet    KNEE ARTHROSCOPY W/ MENISCAL REPAIR  2002   Per [facility] New Patient packet    REPLACEMENT TOTAL KNEE Left 2015   Per [facility] New Patient packet   RIGHT/LEFT HEART CATH AND CORONARY ANGIOGRAPHY N/A 02/07/2018   Procedure: RIGHT/LEFT HEART CATH AND CORONARY ANGIOGRAPHY;  Surgeon: Martinique, Peter M, Mcclain;  Location: [facility];  Service: Cardiovascular;  Laterality: N/A;   TONSILLECTOMY  1951   Per [place] Patient  packet      Current Meds  Medication Sig   acetaminophen (TYLENOL) 650 MG CR tablet Take 650 mg by mouth 2 (two) times daily.    amiodarone (PACERONE) 200 MG tablet Take 1 tablet (200 mg total) by mouth daily.   Calcium Carb-Cholecalciferol (CALCIUM 600/VITAMIN D3 PO) Take 1 tablet by mouth 2 (two) times daily.   Cholecalciferol (VITAMIN D3) 25 MCG (1000 UT) CAPS Take 1,000 Units by mouth daily.    ENTRESTO 49-51 MG TAKE 1 TABLET BY MOUTH 2 TIMES DAILY   fluticasone (FLONASE) 50 MCG/ACT nasal spray Place 1  spray into both nostrils daily.   metoprolol succinate (TOPROL-XL) 50 MG 24 hr tablet Take 50 mg by mouth 2 (two) times daily. Take with or immediately following a meal.   Multiple Vitamins-Minerals (OCUVITE PO) Take 1 tablet by mouth daily.    polycarbophil (FIBERCON) 625 MG tablet Take 1,250 mg by mouth daily.   Probiotic Product (PHILLIPS COLON HEALTH PO) Take 1 capsule by mouth daily.    rosuvastatin (CRESTOR) 5 MG tablet Take 1 tablet (5 mg total) by mouth 4 (four) times a week.   XARELTO 20 MG TABS tablet TAKE 1 TABLET BY MOUTH EVERY DAY WITH SUPPER     Allergies:   Adhesive [tape], Grass extracts [gramineae pollens], Lactose intolerance (gi), Naprosyn [naproxen], Coq10 [coenzyme q10], Latex, and Zocor [simvastatin]   Social History   Tobacco Use   Smoking status: Never Smoker   Smokeless tobacco: Never Used  Substance Use Topics   Alcohol use: Yes    Comment: 1-2 drinks weekly - social    Drug use: Never     Family Hx: The patient's family history includes AAA (abdominal aortic aneurysm) in her sister; Alzheimer's disease (age of onset: 8682) in her father; Arthritis (age of onset: 550) in her mother; Breast cancer in her paternal aunt; CVA in her maternal grandfather; Cancer in her maternal grandfather; Congenital heart disease in her daughter; Congestive Heart Failure in her mother; Dementia in her father; Diabetes in her father and sister; Diabetes (age of  onset: 6940) in her mother; Diabetes (age of onset: 3760) in her maternal grandfather; Heart attack in her maternal aunt; Hyperlipidemia in her father; Lung cancer (age of onset: 8280) in her maternal grandmother; Stroke in her father.  ROS:   Please see the history of present illness.    Constitutional: Negative for chills, fever, night sweats, unintentional weight loss  HENT: Negative for ear pain and hearing loss.   Eyes: Negative for loss of vision and eye pain.  Respiratory: Negative for cough, sputum, wheezing.   Cardiovascular: See HPI. Gastrointestinal: Negative for abdominal pain, melena, and hematochezia.  Genitourinary: Negative for dysuria and hematuria.  Musculoskeletal: Negative for falls and myalgias.  Skin: Negative for itching and rash.  Neurological: Negative for focal weakness, focal sensory changes and loss of consciousness.  Endo/Heme/Allergies: Does bruise/bleed easily.  All other systems reviewed and are negative.   Prior CV studies:   The following studies were reviewed today: Personally reviewed echo, cath, monitor from earlier this year  Labs/Other Tests and Data Reviewed:    EKG:  An ECG dated 02/02/18 was personally reviewed today and demonstrated:  afib RVR at 106 bpm  Recent Labs: 08/16/2018: ALT 8; BUN 22; Creat 1.01; Hemoglobin 13.4; Platelets 190; Potassium 4.3; Sodium 139   Recent Lipid Panel Lab Results  Component Value Date/Time   CHOL 224 (H) 08/16/2018 11:38 AM   TRIG 78 08/16/2018 11:38 AM   HDL 96 08/16/2018 11:38 AM   CHOLHDL 2.3 08/16/2018 11:38 AM   LDLCALC 111 (H) 08/16/2018 11:38 AM    Wt Readings from Last 3 Encounters:  12/18/18 205 lb 12.8 oz (93.4 kg)  08/16/18 202 lb (91.6 kg)  06/14/18 196 lb (88.9 kg)     Objective:    Vital Signs:  BP 137/89    Pulse 75    Ht 5\' 2"  (1.575 m)    Wt 205 lb 12.8 oz (93.4 kg)    BMI 37.64 kg/m    Speaking comfortably on the phone, no audible wheezing  In no acute distress Alert and  oriented Normal affect Normal speech  ASSESSMENT & PLAN:    Nonischemic cardiomyopathywith reduced EF, moderate MR, and moderate-severe TR: improved on most recent echo. NYHA class II symptoms -cath with minimal nonobstructive CAD -MR more mild-moderate on cath; improved MR and TR on most recent echo -tolerating entresto well. Did discuss that even with improvement in EF, recommendations are to continue entresto given good response. -continue metoprolol succinate -weight gain 2/2 diet/exercise, no recent HF exacerbation symptoms.  Atrial fibrillation: was paroxsymal, likely now persistent: she has been largely asymptomatic. However, due to the pandemic and virtual visits, we have not been able to reassess her rhythm. She has been rate controlled -monitor 01/2018 showed 100% burden of afib.  -feels well on amiodarone. However, we discussed that this is a rhythm control agent. If she is not in sinus, may need to discuss alternative strategy -will get monitor to assess burden of afib on amiodarone -recent TFTs and LFTs WNL. Will check PFTs at follow up if she remains on amiodarone  -CHA2DS2/VAS Stroke Risk Points=3, continue rivaroxaban for anticoagulation  Hypertension: slightly above goal of <130/80 - Continue entresto and metoprolol for now, pending med adjustment if needed for afib  Hyperlipidemia (hypercholesterolemia): on rosuvastatin, LDL 90 without prior history of ASCVD. Had nonobstructive CAD on cath. No aspirin as she is on rivaroxaban, recent lipid panel checked on 3x/week rosuvastatin. Goal LDL <70. Has moved up to 4x/week statin. Recheck at follow up.   COVID-19 Education: The signs and symptoms of COVID-19 were discussed with the patient and how to seek care for testing (follow up with PCP or arrange E-visit).  The importance of social distancing was discussed today.  Time:   Today, I have spent 27 minutes with the patient with telehealth technology discussing the above  problems.     Medication Adjustments/Labs and Tests Ordered: Current medicines are reviewed at length with the patient today.  Concerns regarding medicines are outlined above.   Tests Ordered: Orders Placed This Encounter  Procedures   LONG TERM MONITOR (3-14 DAYS)    Medication Changes: No orders of the defined types were placed in this encounter.  Patient Instructions  Medication Instructions:  Your Physician recommend you continue on your current medication as directed.    *If you need a refill on your cardiac medications before your next appointment, please call your pharmacy*  Lab Work: None  Testing/Procedures: Our physician has recommended that you wear an  7 DAY ZIO-PATCH monitor. The Zio patch cardiac monitor continuously records heart rhythm data for up to 14 days, this is for patients being evaluated for multiple types heart rhythms. For the first 24 hours post application, please avoid getting the Zio monitor wet in the shower or by excessive sweating during exercise. After that, feel free to carry on with regular activities. Keep soaps and lotions away from the ZIO XT Patch.        Follow-Up: At [facility], you and your health needs are our priority.  As part of our continuing mission to provide you with exceptional heart care, we have created designated Provider Care Teams.  These Care Teams include your primary Cardiologist (physician) and Advanced Practice Providers (APPs -  Physician Assistants and Nurse Practitioners) who all work together to provide you with the care you need, when you need it.  Your next appointment:   6 week(s)  The format for your next appointment:   Either In Person or Virtual  Provider:  Jodelle RedBridgette Estephanie Hubbs, Mcclain    Signed, Jodelle RedBridgette Everitt Wenner, Mcclain  12/18/2018 11:42 PM    [facility]

## 2018-12-18 NOTE — Patient Instructions (Signed)
Medication Instructions:  Your Physician recommend you continue on your current medication as directed.    *If you need a refill on your cardiac medications before your next appointment, please call your pharmacy*  Lab Work: None  Testing/Procedures: Our physician has recommended that you wear an  [place] monitor. The Zio patch cardiac monitor continuously records heart rhythm data for up to 14 days, this is for patients being evaluated for multiple types heart rhythms. For the first 24 hours post application, please avoid getting the Zio monitor wet in the shower or by excessive sweating during exercise. After that, feel free to carry on with regular activities. Keep soaps and lotions away from the ZIO XT Patch.        Follow-Up: At [facility], you and your health needs are our priority.  As part of our continuing mission to provide you with exceptional heart care, we have created designated Provider Care Teams.  These Care Teams include your primary Cardiologist (physician) and Advanced Practice Providers (APPs -  Physician Assistants and Nurse Practitioners) who all work together to provide you with the care you need, when you need it.  Your next appointment:   6 week(s)  The format for your next appointment:   Either In Person or Virtual  Provider:   Buford Dresser, MD

## 2018-12-19 ENCOUNTER — Ambulatory Visit: Payer: Medicare Other | Admitting: Cardiology

## 2018-12-20 ENCOUNTER — Telehealth: Payer: Self-pay | Admitting: Cardiology

## 2018-12-20 NOTE — Telephone Encounter (Signed)
-----   Message from Meryl Crutch, RN sent at 12/18/2018 11:21 AM EST ----- Regarding: appointment 6 week f/u with Dr. Harrell Gave. Can be virtual or in person.

## 2018-12-20 NOTE — Telephone Encounter (Signed)
LMTCB to schedule 6 weeks follow up with Dr. Harrell Gave

## 2018-12-25 ENCOUNTER — Ambulatory Visit (INDEPENDENT_AMBULATORY_CARE_PROVIDER_SITE_OTHER): Payer: Medicare Other

## 2018-12-25 DIAGNOSIS — I482 Chronic atrial fibrillation, unspecified: Secondary | ICD-10-CM | POA: Diagnosis not present

## 2019-01-12 HISTORY — PX: TOOTH EXTRACTION: SUR596

## 2019-01-15 DIAGNOSIS — I482 Chronic atrial fibrillation, unspecified: Secondary | ICD-10-CM | POA: Diagnosis not present

## 2019-01-30 ENCOUNTER — Ambulatory Visit: Payer: Medicare Other | Attending: Internal Medicine

## 2019-01-30 DIAGNOSIS — Z23 Encounter for immunization: Secondary | ICD-10-CM | POA: Insufficient documentation

## 2019-01-30 NOTE — Progress Notes (Signed)
   Covid-19 Vaccination Clinic  Name:  Jennifer Mcclain    MRN: [id number] DOB: 1945/12/11  01/30/2019  Jennifer Mcclain was observed post Covid-19 immunization for 15 minutes without incidence. She was provided with Vaccine Information Sheet and instruction to access the V-Safe system.   Jennifer Mcclain was instructed to call 911 with any severe reactions post vaccine: Marland Kitchen Difficulty breathing  . Swelling of your face and throat  . A fast heartbeat  . A bad rash all over your body  . Dizziness and weakness    Immunizations Administered    Name Date Dose VIS Date Route   Pfizer COVID-19 Vaccine 01/30/2019  2:50 PM 0.3 mL 12/22/2018 Intramuscular   Manufacturer: [employer], [employer]   Lot: [id number]   NDC: [address]-4

## 2019-01-31 ENCOUNTER — Encounter: Payer: Self-pay | Admitting: Cardiology

## 2019-01-31 ENCOUNTER — Telehealth (INDEPENDENT_AMBULATORY_CARE_PROVIDER_SITE_OTHER): Payer: Medicare Other | Admitting: Cardiology

## 2019-01-31 VITALS — BP 119/81 | HR 60 | Ht 62.0 in | Wt 209.0 lb

## 2019-01-31 DIAGNOSIS — I482 Chronic atrial fibrillation, unspecified: Secondary | ICD-10-CM | POA: Diagnosis not present

## 2019-01-31 DIAGNOSIS — I10 Essential (primary) hypertension: Secondary | ICD-10-CM | POA: Diagnosis not present

## 2019-01-31 DIAGNOSIS — Z712 Person consulting for explanation of examination or test findings: Secondary | ICD-10-CM

## 2019-01-31 DIAGNOSIS — Z79899 Other long term (current) drug therapy: Secondary | ICD-10-CM | POA: Diagnosis not present

## 2019-01-31 DIAGNOSIS — I428 Other cardiomyopathies: Secondary | ICD-10-CM

## 2019-01-31 NOTE — Progress Notes (Signed)
Virtual Visit via Video Note   This visit type was conducted due to national recommendations for restrictions regarding the COVID-19 Pandemic (e.g. social distancing) in an effort to limit this patient's exposure and mitigate transmission in our community.  Due to her co-morbid illnesses, this patient is at least at moderate risk for complications without adequate follow up.  This format is felt to be most appropriate for this patient at this time.  All issues noted in this document were discussed and addressed.  A limited physical exam was performed with this format.  Please refer to the patient's chart for her consent to telehealth for [facility].   Attempted video visit, had technical limitations, converted to phone visit.  Date:  01/31/2019   ID:  Jennifer Mcclain, DOB 1945-10-26, MRN [id number]  Patient Location: Home Provider Location: Office  PCP:  [name], NP  Cardiologist:  [name], MD  Electrophysiologist:  None   Evaluation Performed:  Follow-Up Visit  Chief Complaint:  Follow up  History of Present Illness:    Jennifer Mcclain is a 74 y.o. female withhx of atrial fibrillation, hyperlipidemia, hypertension who is seen for follow up for atrial fibrillation, nonischemic cardiomyopathy. Initial consult with me was on 01/17/18 for atrial fibrillation.  The patient does not have symptoms concerning for COVID-19 infection (fever, chills, cough, or new shortness of breath).   Today: Doing well overall. Checking BP/HR at home. 137/89, HR 75 today. 138/96, HR 57 yesterday. Has felt afib more frequently than recent times. Reviewed that monitor earlier this year showed 100% burden of afib. She is feeling a few times a week at most. Discussed repeat monitor, as this will help [place] with next steps. We discussed that if she is in afib 100% of the time on amiodarone, this is not an ideal rhythm control strategy. At that point, will need to discuss alternative  rhythm control strategy vs. Rate control.  Has not been exercising much at home, working on increasing this. She has noted weight gain but believes this is the reason why.  Occasional LE edema after walking, but no significant edema otherwise. Denies chest pain, shortness of breath at rest or with normal exertion. No PND, orthopnea, or unexpected weight gain. No syncope.  Today: Had first dose of covid vaccine yesterday. Overall doing well with this.  Reviewed her vitals today. Three numbers, improved significantly by third reading.  Reviewed monitor results today. It again appears that she is 100% afib. We discussed atrial fibrillation strategies at length today. Discussed cardioversion (she has never had), procedure, risk/benefit. Discussed alternative attempts to regain sinus rhythm. She has had ablations in the past, after the first told she had WPW that couldn't be ablated, but repeat evaluation did not show WPW.   We discussed afib rate vs. Rhythm control at length. After shared decision making, she wishes to remain in permanent afib with rate control and anticoagulation at this time.   Denies chest pain, shortness of breath at rest or with normal exertion. No PND, orthopnea, LE edema or unexpected weight gain. No syncope or palpitations.  Past Medical History:  Diagnosis Date  . Achilles tendinitis    Per [facility] New Patient packet   . Atrial fibrillation [facility])    Per records from [facility] in [place], [place]   . Baker's cyst of knee, left    with resulting lower extremity edema  . Benign cyst of breast, left    Per records from [facility] in [place], [place]. Has yearly U/S in the past   . Cerebrovascular disease    Per [facility] New Patient packet   . Chronic atrial fibrillation [facility])    Per [facility] New Patient packet   . Chronic atrial fibrillation (HCC)    [name], MD  . Decreased hearing, bilateral    referred to audiologist, Per records from [facility] in [place], [place]   . Disorder of bone density and structure, unspecified    Per records from [facility] in [place], [place]   . Disorder of carotid artery (HCC)    Lynden Ang, MD  . Dyslipidemia    [name], MD  . Elevated alkaline phosphatase level    Neg ative ANA 08/2013. Per records from [facility] in [place], [place]   . H/O calculus of kidney during pregnancy    Lynden Ang, MD  . H/O hysterectomy for benign disease    Lynden Ang, MD  . History of fibula fracture    Right, Per records from [facility] in [place], [place]   . History of torn meniscus of right knee    Per records from [facility] in [place], [place]   . Humerus fracture    left leg- due to fall  . Humerus fracture    Resulting from a fall, Per records from [facility] in [place], [place]   . Hypercalcemia    Per records from [facility] in [place], [place]   . Hypertension    Per [facility] New Patient packet   . Kidney stone on left side    Per records from [facility] in [place], [place]   . Lactose intolerance    Per [facility] New Patient packet   . Multiple actinic keratoses    Lynden Ang, MD  . Myalgia due to statin    Per records from [facility] in [place], [place]. Due to crestor   . 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 Normocytic anemia    Lynden Ang, MD  . Obstructive sleep apnea syndrome    Per records from [facility] in [place], [place]. Patient did not tolerate CPAP machine   . Onychomycosis    Per records from [facility] in [place], [place]   . Osteoarthritis    Per [facility] New Patient packet   . Osteopenia    Per records from [facility] in [place], [place]. Treated in the past with antiresorptive therapy  . Renal cyst 10/11/2012   Simple on CT Scan 10/2012. Per records from [facility] in [place], [place]   . Sensorineural hearing loss (SNHL),  bilateral 01/28/2017   Lynden Ang , MD  . Umbilical hernia    Per [facility] New Patient packet   . Vitamin D deficiency    Lynden Ang, MD   Past Surgical History:  Procedure Laterality Date  . ABDOMINAL HYSTERECTOMY  1989   One ovary, Per [facility] New Patient packet   . ABLATION  1996   Radical, Per [facility] New Patient packet   . BREAST CYST ASPIRATION Bilateral    numerous, over several years per pt, always benign  . COLONOSCOPY  2019   Dr.Melissa Fayrene Fearing, Per [facility] New Patient packet   . KNEE ARTHROSCOPY W/ MENISCAL REPAIR  2002   Per [facility] New Patient packet   . REPLACEMENT TOTAL KNEE Left 2015   Per [facility] New Patient packet  . RIGHT/LEFT HEART CATH  AND CORONARY ANGIOGRAPHY N/A 02/07/2018   Procedure: RIGHT/LEFT HEART CATH AND CORONARY ANGIOGRAPHY;  Surgeon: Martinique, Peter M, MD;  Location: [facility];  Service: Cardiovascular;  Laterality: N/A;  . TONSILLECTOMY  1951   Per [facility] New Patient packet      Current Meds  Medication Sig  . acetaminophen (TYLENOL) 650 MG CR tablet Take 650 mg by mouth 2 (two) times daily.   . Calcium Carb-Cholecalciferol (CALCIUM 600/VITAMIN D3 PO) Take 1 tablet by mouth 2 (two) times daily.  . Cholecalciferol (VITAMIN D3) 25 MCG (1000 UT) CAPS Take 1,000 Units by mouth daily.   Marland Kitchen ENTRESTO 49-51 MG TAKE 1 TABLET BY MOUTH 2 TIMES DAILY  . fluticasone (FLONASE) 50 MCG/ACT nasal spray Place 1 spray into both nostrils daily.  . metoprolol succinate (TOPROL-XL) 50 MG 24 hr tablet Take 50 mg by mouth 2 (two) times daily. Take with or immediately following a meal.  . Multiple Vitamins-Minerals (OCUVITE PO) Take 1 tablet by mouth daily.   . polycarbophil (FIBERCON) 625 MG tablet Take 1,250 mg by mouth daily.  . Probiotic Product (PHILLIPS COLON HEALTH PO) Take 1 capsule by mouth daily.   . rosuvastatin (CRESTOR) 5 MG tablet Take 1 tablet (5 mg total) by mouth 4 (four) times a week.  Alveda Reasons 20 MG TABS tablet TAKE 1 TABLET BY MOUTH EVERY DAY WITH SUPPER  .  [DISCONTINUED] amiodarone (PACERONE) 200 MG tablet Take 1 tablet (200 mg total) by mouth daily.     Allergies:   Adhesive [tape], Grass extracts [gramineae pollens], Lactose intolerance (gi), Naprosyn [naproxen], Coq10 [coenzyme q10], Latex, and Zocor [simvastatin]   Social History   Tobacco Use  . Smoking status: Never Smoker  . Smokeless tobacco: Never Used  Substance Use Topics  . Alcohol use: Yes    Comment: 1-2 drinks weekly - social   . Drug use: Never     Family Hx: The patient's family history includes AAA (abdominal aortic aneurysm) in her sister; Alzheimer's disease (age of onset: 41) in her father; Arthritis (age of onset: 40) in her mother; Breast cancer in her paternal aunt; CVA in her maternal grandfather; Cancer in her maternal grandfather; Congenital heart disease in her daughter; Congestive Heart Failure in her mother; Dementia in her father; Diabetes in her father and sister; Diabetes (age of onset: 67) in her mother; Diabetes (age of onset: 33) in her maternal grandfather; Heart attack in her maternal aunt; Hyperlipidemia in her father; Lung cancer (age of onset: 40) in her maternal grandmother; Stroke in her father.  ROS:   Please see the history of present illness.   ROS negative except as noted.   Prior CV studies:   The following studies were reviewed today: Personally reviewed echo, cath, monitor from earlier this year  Labs/Other Tests and Data Reviewed:    EKG:  An ECG dated 02/02/18 was personally reviewed today and demonstrated:  afib RVR at 106 bpm  Recent Labs: 08/16/2018: ALT 8; BUN 22; Creat 1.01; Hemoglobin 13.4; Platelets 190; Potassium 4.3; Sodium 139   Recent Lipid Panel Lab Results  Component Value Date/Time   CHOL 224 (H) 08/16/2018 11:38 AM   TRIG 78 08/16/2018 11:38 AM   HDL 96 08/16/2018 11:38 AM   CHOLHDL 2.3 08/16/2018 11:38 AM   LDLCALC 111 (H) 08/16/2018 11:38 AM    Wt Readings from Last 3 Encounters:  01/31/19 209 lb (94.8 kg)    12/18/18 205 lb 12.8 oz (93.4 kg)  08/16/18 202 lb (  91.6 kg)     Objective:    Vital Signs:  BP 119/81 Comment: Taken second  Pulse 60   Ht 5\' 2"  (1.575 m)   Wt 209 lb (94.8 kg)   BMI 38.23 kg/m    Speaking comfortably on the phone, no audible wheezing In no acute distress Alert and oriented Normal affect Normal speech  ASSESSMENT & PLAN:    Nonischemic cardiomyopathywith reduced EF, moderate MR, and moderate-severe TR: improved on most recent echo. NYHA class II symptoms -cath with minimal nonobstructive CAD -MR more mild-moderate on cath; improved MR and TR on most recent echo -tolerating entresto well. Did discuss that even with improvement in EF, recommendations are to continue entresto given good response. -continue metoprolol succinate  Atrial fibrillation: now permanent: We discussed this at length today and reviewed her repeat monitor -monitor 01/2018 showed 100% burden of afib. Repeat monitor showed the same 01/2019. -we discussed rate vs rhythm control strategy. Amiodarone has not kept her in sinus rhythm. Declines cardioversion, antiarrhythmic, discussion for repeat ablation as overall she feels well. -stop amiodarone -rate control with metoprolol -CHA2DS2/VAS Stroke Risk Points=3, continue rivaroxaban for anticoagulation  Hypertension: repeat numbers at goal today - Continue entresto and metoprolol  Hyperlipidemia (hypercholesterolemia): on rosuvastatin, LDL 90 without prior history of ASCVD. Had nonobstructive CAD on cath. No aspirin as she is on rivaroxaban, recent lipid panel checked on 3x/week rosuvastatin. Goal LDL <70. Has moved up to 4x/week statin. Recheck at follow up  COVID-19 Education: The signs and symptoms of COVID-19 were discussed with the patient and how to seek care for testing (follow up with PCP or arrange E-visit).  The importance of social distancing was discussed today.  Time:   Today, I have spent 26 minutes with the patient with  telehealth technology discussing the above problems.  Total time including chart review and documentation 31 minutes.  Patient Instructions  Medication Instructions:  STOP TAKING YOUR AMIODARONE CONTINUE ALL OTHER MEDICATIONS  *If you need a refill on your cardiac medications before your next appointment, please call your pharmacy*  Lab Work: NONE If you have labs (blood work) drawn today and your tests are completely normal, you will receive your results only by: 02/2019 MyChart Message (if you have MyChart) OR . A paper copy in the mail If you have any lab test that is abnormal or we need to change your treatment, we will call you to review the results.    Follow-Up: At [facility], you and your health needs are our priority.  As part of our continuing mission to provide you with exceptional heart care, we have created designated Provider Care Teams.  These Care Teams include your primary Cardiologist (physician) and Advanced Practice Providers (APPs -  Physician Assistants and Nurse Practitioners) who all work together to provide you with the care you need, when you need it.  Your next appointment:   3 month(s)  The format for your next appointment:   In Person  Provider:   [facility], MD    Signed, [name], MD  01/31/2019 1:48 PM    [facility]

## 2019-01-31 NOTE — Patient Instructions (Signed)
Medication Instructions:  STOP TAKING YOUR AMIODARONE CONTINUE ALL OTHER MEDICATIONS  *If you need a refill on your cardiac medications before your next appointment, please call your pharmacy*  Lab Work: NONE If you have labs (blood work) drawn today and your tests are completely normal, you will receive your results only by: Marland Kitchen MyChart Message (if you have MyChart) OR . A paper copy in the mail If you have any lab test that is abnormal or we need to change your treatment, we will call you to review the results.    Follow-Up: At [facility], you and your health needs are our priority.  As part of our continuing mission to provide you with exceptional heart care, we have created designated Provider Care Teams.  These Care Teams include your primary Cardiologist (physician) and Advanced Practice Providers (APPs -  Physician Assistants and Nurse Practitioners) who all work together to provide you with the care you need, when you need it.  Your next appointment:   3 month(s)  The format for your next appointment:   In Person  Provider:   [name], MD

## 2019-02-09 ENCOUNTER — Other Ambulatory Visit: Payer: Self-pay | Admitting: Cardiology

## 2019-02-15 ENCOUNTER — Encounter: Payer: Self-pay | Admitting: Nurse Practitioner

## 2019-02-16 ENCOUNTER — Ambulatory Visit: Payer: Medicare Other | Admitting: Nurse Practitioner

## 2019-02-19 ENCOUNTER — Ambulatory Visit: Payer: Medicare Other | Attending: Internal Medicine

## 2019-02-19 ENCOUNTER — Encounter: Payer: Medicare Other | Admitting: Nurse Practitioner

## 2019-02-19 DIAGNOSIS — Z23 Encounter for immunization: Secondary | ICD-10-CM

## 2019-02-19 NOTE — Progress Notes (Signed)
   Covid-19 Vaccination Clinic  Name:  Jennifer Mcclain    MRN: [id number] DOB: 13-Nov-1945  02/19/2019  [name] was observed post Covid-19 immunization for 30 minutes based on pre-vaccination screening without incidence. She was provided with Vaccine Information Sheet and instruction to access the V-Safe system.   [name] was instructed to call 911 with any severe reactions post vaccine: Marland Kitchen Difficulty breathing  . Swelling of your face and throat  . A fast heartbeat  . A bad rash all over your body  . Dizziness and weakness    Immunizations Administered    Name Date Dose VIS Date Route   Pfizer COVID-19 Vaccine 02/19/2019  4:32 PM 0.3 mL 12/22/2018 Intramuscular   Manufacturer: [employer], [employer]   Lot: [id number]   NDC: [address]-2

## 2019-02-22 ENCOUNTER — Other Ambulatory Visit: Payer: Self-pay | Admitting: Cardiology

## 2019-02-23 MED ORDER — ROSUVASTATIN CALCIUM 5 MG PO TABS: 5.0000 mg | ORAL_TABLET | ORAL | 2 refills | Status: DC

## 2019-02-26 ENCOUNTER — Other Ambulatory Visit: Payer: Self-pay

## 2019-02-27 DIAGNOSIS — H5213 Myopia, bilateral: Secondary | ICD-10-CM | POA: Diagnosis not present

## 2019-02-27 DIAGNOSIS — H2513 Age-related nuclear cataract, bilateral: Secondary | ICD-10-CM | POA: Diagnosis not present

## 2019-02-27 DIAGNOSIS — H353131 Nonexudative age-related macular degeneration, bilateral, early dry stage: Secondary | ICD-10-CM | POA: Diagnosis not present

## 2019-03-19 ENCOUNTER — Ambulatory Visit (INDEPENDENT_AMBULATORY_CARE_PROVIDER_SITE_OTHER): Payer: Medicare Other | Admitting: Nurse Practitioner

## 2019-03-19 ENCOUNTER — Encounter: Payer: Self-pay | Admitting: Nurse Practitioner

## 2019-03-19 ENCOUNTER — Other Ambulatory Visit: Payer: Self-pay

## 2019-03-19 VITALS — BP 136/80 | HR 76 | Temp 96.8°F | Ht 62.0 in | Wt 213.0 lb

## 2019-03-19 DIAGNOSIS — M8949 Other hypertrophic osteoarthropathy, multiple sites: Secondary | ICD-10-CM

## 2019-03-19 DIAGNOSIS — Z0000 Encounter for general adult medical examination without abnormal findings: Secondary | ICD-10-CM | POA: Diagnosis not present

## 2019-03-19 DIAGNOSIS — I428 Other cardiomyopathies: Secondary | ICD-10-CM | POA: Diagnosis not present

## 2019-03-19 DIAGNOSIS — I482 Chronic atrial fibrillation, unspecified: Secondary | ICD-10-CM | POA: Diagnosis not present

## 2019-03-19 DIAGNOSIS — I2583 Coronary atherosclerosis due to lipid rich plaque: Secondary | ICD-10-CM

## 2019-03-19 DIAGNOSIS — M159 Polyosteoarthritis, unspecified: Secondary | ICD-10-CM

## 2019-03-19 DIAGNOSIS — I2510 Atherosclerotic heart disease of native coronary artery without angina pectoris: Secondary | ICD-10-CM | POA: Diagnosis not present

## 2019-03-19 DIAGNOSIS — E2839 Other primary ovarian failure: Secondary | ICD-10-CM

## 2019-03-19 DIAGNOSIS — E7800 Pure hypercholesterolemia, unspecified: Secondary | ICD-10-CM | POA: Diagnosis not present

## 2019-03-19 DIAGNOSIS — E559 Vitamin D deficiency, unspecified: Secondary | ICD-10-CM | POA: Diagnosis not present

## 2019-03-19 MED ORDER — METOPROLOL SUCCINATE ER 50 MG PO TB24: 50.0000 mg | ORAL_TABLET | Freq: Two times a day (BID) | ORAL | 1 refills | Status: DC

## 2019-03-19 NOTE — Progress Notes (Signed)
Careteam: Patient Care Team: Jennifer Seller, NP as PCP - General (Geriatric Medicine) Jennifer Red, MD as PCP - Cardiology (Cardiology)  PLACE OF SERVICE:  [facility]  Advanced Directive information Does Patient Have a Medical Advance Directive?: Yes, Type of Advance Directive: Healthcare Power of Jennifer Mcclain;Living will, Does patient want to make changes to medical advance directive?: No - Patient declined  Allergies  Allergen Reactions  . Adhesive [Tape]     Skin irritation   . Grass Extracts [Gramineae Pollens]      Per records from from [employer]  . Lactose Intolerance (Gi) Other (See Comments)    Unknown  . Naprosyn [Naproxen] Hives  . Coq10 [Coenzyme Q10] Other (See Comments)    Cramps   . Latex Rash  . Zocor [Simvastatin] Other (See Comments)    cramps    Chief Complaint  Patient presents with  . Medical Management of Chronic Issues    6 month follow-up  . Knee Problem    Examine knee's and variation in sizes.   . Medication Management    Discuss vit D dosing, patient questions if she was taking too much.      HPI: Patient is a 74 y.o. female for routine follow up.  Osteopenia- taking cal and vit d with extra vit d 1000 mcg. She is due for dexa- ordered during AWV today  Vit D def- continues on supplement.   Hyperlipidemia- high HDL with slighly elevated LDL- tolerating crestor 5 mg- 4 times weekly.   A fib- rate controlled- recently wore monitor and showed a fib constantly. Continue on metoprolol 50 mg. Will feel occasional palpitations. Continues on xarelto, without abnormal bruising or bleeding.   CHF- Stable, no swelling in LE, no shortness of breath. Following with cardiologist routinely.   Left knee- had had a hx of left knee replacement. Reports it is bigger than right knee. Reports a little pain associated with knee. Does not limit her from walking.  Reports ankle feels "crunchy"     Review of Systems:  Review of  Systems  Constitutional: Negative for chills, fever and weight loss.  HENT: Positive for hearing loss. Negative for tinnitus.   Respiratory: Negative for cough, sputum production and shortness of breath.   Cardiovascular: Positive for palpitations. Negative for chest pain and leg swelling.  Gastrointestinal: Negative for abdominal pain, constipation, diarrhea and heartburn.  Genitourinary: Negative for dysuria, frequency and urgency.  Musculoskeletal: Positive for joint pain. Negative for back pain, falls and myalgias.  Skin: Negative.   Neurological: Negative for dizziness and headaches.  Psychiatric/Behavioral: Negative for depression. The patient is not nervous/anxious and does not have insomnia.     Past Medical History:  Diagnosis Date  . Achilles tendinitis    Per [facility] New Patient packet   . Atrial fibrillation [facility])    Per records from [facility] in [place], [place]   . Baker's cyst of knee, left    with resulting lower extremity edema  . Benign cyst of breast, left    Per records from [facility] in [place], [place]. Has yearly U/S in the past   . Cerebrovascular disease    Per [facility] New Patient packet   . Chronic atrial fibrillation [facility])    Per [facility] New Patient packet   . Chronic atrial fibrillation (HCC)    Jennifer Field, MD  . Decreased hearing, bilateral    referred to audiologist, Per records from [facility] in [place], [place]   .  Disorder of bone density and structure, unspecified    Per records from [facility] in [place], [place]   . Disorder of carotid artery (HCC)    Lynden Ang, MD  . Dyslipidemia    Jennifer Field, MD  . Elevated alkaline phosphatase level    Neg ative ANA 08/2013. Per records from [facility] in [place], [place]   . H/O calculus of kidney during pregnancy    Lynden Ang, MD  . H/O hysterectomy for benign disease    Lynden Ang, MD  . History of fibula fracture    Right, Per records from  [facility] in [place], [place]   . History of torn meniscus of right knee    Per records from [facility] in [place], [place]   . Humerus fracture    left leg- due to fall  . Humerus fracture    Resulting from a fall, Per records from [facility] in [place], [place]   . Hypercalcemia    Per records from [facility] in [place], [place]   . Hypertension    Per [facility] New Patient packet   . Kidney stone on left side    Per records from [facility] in [place], [place]   . Lactose intolerance    Per [facility] New Patient packet   . Multiple actinic keratoses    Lynden Ang, MD  . Myalgia due to statin    Per records from [facility] in [place], [place]. Due to crestor   . 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 Normocytic anemia    Lynden Ang, MD  . Obstructive sleep apnea syndrome    Per records from [facility] in [place], [place]. Patient did not tolerate CPAP machine   . Onychomycosis    Per records from [facility] in [place], [place]   . Osteoarthritis    Per [facility] New Patient packet   . Osteopenia    Per records from [facility] in [place], [place]. Treated in the past with antiresorptive therapy  . Renal cyst 10/11/2012   Simple on CT Scan 10/2012. Per records from [facility] in [place], [place]   . 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 Umbilical hernia    Per [facility] New Patient packet   . Vitamin D deficiency    Lynden Ang, MD   Past Surgical History:  Procedure Laterality Date  . ABDOMINAL HYSTERECTOMY  1989   One ovary, Per [facility] New Patient packet   . ABLATION  1996   Radical, Per [facility] New Patient packet   . BREAST CYST ASPIRATION Bilateral    numerous, over several years per pt, always benign  . COLONOSCOPY  2019   Dr.Melissa Fayrene Fearing, Per [facility] New Patient packet   . KNEE ARTHROSCOPY W/ MENISCAL REPAIR  2002   Per [facility] New  Patient packet   . REPLACEMENT TOTAL KNEE Left 2015   Per [facility] New Patient packet  . RIGHT/LEFT HEART CATH AND CORONARY ANGIOGRAPHY N/A 02/07/2018   Procedure: RIGHT/LEFT HEART CATH AND CORONARY ANGIOGRAPHY;  Surgeon: Swazil[place], Peter M, MD;  Location: [facility] INVASIVE CV LAB;  Service: Cardiovascular;  Laterality: N/A;  . TONSILLECTOMY  1951   Per [facility] New Patient packet   . TOOTH EXTRACTION  01/12/2019   Social History:   reports that she has never smoked. She has never used smokeless tobacco. She reports current alcohol use.  She reports that she does not use drugs.  Family History  Problem Relation Age of Onset  . Congestive Heart Failure Mother   . Diabetes Mother 82  . Arthritis Mother 20  . Stroke Father   . Dementia Father   . Diabetes Father   . Hyperlipidemia Father   . Alzheimer's disease Father 50  . Diabetes Sister   . Cancer Maternal Grandfather   . Diabetes Maternal Grandfather 60  . CVA Maternal Grandfather   . AAA (abdominal aortic aneurysm) Sister   . Congenital heart disease Daughter   . Heart attack Maternal Aunt   . Lung cancer Maternal Grandmother 80  . Breast cancer Paternal Aunt     Medications: Patient's Medications  New Prescriptions   No medications on file  Previous Medications   ACETAMINOPHEN (TYLENOL) 650 MG CR TABLET    Take 650 mg by mouth in the morning, at noon, and at bedtime.    CALCIUM CARB-CHOLECALCIFEROL (CALCIUM 600/VITAMIN D3 PO)    Take 1 tablet by mouth 2 (two) times daily.   CHOLECALCIFEROL (VITAMIN D3) 25 MCG (1000 UT) CAPS    Take 1,000 Units by mouth daily.    ENTRESTO 49-51 MG    TAKE 1 TABLET BY MOUTH 2 TIMES DAILY   FLUTICASONE (FLONASE) 50 MCG/ACT NASAL SPRAY    Place 1 spray into both nostrils daily.   METOPROLOL SUCCINATE (TOPROL-XL) 50 MG 24 HR TABLET    Take 50 mg by mouth 2 (two) times daily. Take with or immediately following a meal.   MULTIPLE VITAMINS-MINERALS (OCUVITE PO)    Take 1 tablet by mouth daily.    POLYCARBOPHIL  (FIBERCON) 625 MG TABLET    Take 1,250 mg by mouth daily.   PROBIOTIC PRODUCT (PHILLIPS COLON HEALTH PO)    Take 1 capsule by mouth daily.    ROSUVASTATIN (CRESTOR) 5 MG TABLET    Take 1 tablet (5 mg total) by mouth 4 (four) times a week.   XARELTO 20 MG TABS TABLET    TAKE 1 TABLET BY MOUTH EVERY DAY WITH SUPPER  Modified Medications   No medications on file  Discontinued Medications   No medications on file    Physical Exam:  Vitals:   03/19/19 1422  BP: 136/80  Pulse: 76  Temp: (!) 96.8 F (36 C)  TempSrc: Temporal  SpO2: 99%  Weight: 213 lb (96.6 kg)  Height: 5\' 2"  (1.575 m)   Body mass index is 38.96 kg/m. Wt Readings from Last 3 Encounters:  03/19/19 213 lb (96.6 kg)  03/19/19 213 lb (96.6 kg)  01/31/19 209 lb (94.8 kg)    Physical Exam Constitutional:      General: She is not in acute distress.    Appearance: She is well-developed. She is not diaphoretic.  HENT:     Head: Normocephalic and atraumatic.     Mouth/Throat:     Pharynx: No oropharyngeal exudate.  Eyes:     Conjunctiva/sclera: Conjunctivae normal.     Pupils: Pupils are equal, round, and reactive to light.  Cardiovascular:     Rate and Rhythm: Normal rate. Rhythm irregular.     Heart sounds: Normal heart sounds.  Pulmonary:     Effort: Pulmonary effort is normal.     Breath sounds: Normal breath sounds.  Abdominal:     General: Bowel sounds are normal.     Palpations: Abdomen is soft.  Musculoskeletal:        General: No tenderness.  Cervical back: Normal range of motion and neck supple.  Skin:    General: Skin is warm and dry.  Neurological:     Mental Status: She is alert and oriented to person, place, and time.     Labs reviewed: Basic Metabolic Panel: Recent Labs    08/16/18 1138  NA 139  K 4.3  CL 102  CO2 30  GLUCOSE 95  BUN 22  CREATININE 1.01*  CALCIUM 10.0   Liver Function Tests: Recent Labs    08/16/18 1138  AST 14  ALT 8  BILITOT 0.5  PROT 6.9   No  results for input(s): LIPASE, AMYLASE in the last 8760 hours. No results for input(s): AMMONIA in the last 8760 hours. CBC: Recent Labs    08/16/18 1138  WBC 4.7  NEUTROABS 2,938  HGB 13.4  HCT 40.5  MCV 98.8  PLT 190   Lipid Panel: Recent Labs    08/16/18 1138  CHOL 224*  HDL 96  LDLCALC 111*  TRIG 78  CHOLHDL 2.3   TSH: No results for input(s): TSH in the last 8760 hours. A1C: Lab Results  Component Value Date   HGBA1C 5.5 01/28/2017     Assessment/Plan 1. Vitamin D deficiency -continues on supplement  - Vitamin D, 25-hydroxy  2. Nonischemic cardiomyopathy (HCC) -stable without worsening shortness of breath or chest pain. Followed by cardiology routinely  -continues on toprol XR and entresto - metoprolol succinate (TOPROL-XL) 50 MG 24 hr tablet; Take 1 tablet (50 mg total) by mouth 2 (two) times daily. Take with or immediately following a meal.  Dispense: 180 tablet; Refill: 1  3. Chronic atrial fibrillation (HCC) Rate controlled. Continues on eliquis for anticoagulation  - metoprolol succinate (TOPROL-XL) 50 MG 24 hr tablet; Take 1 tablet (50 mg total) by mouth 2 (two) times daily. Take with or immediately following a meal.  Dispense: 180 tablet; Refill: 1 - CBC with Differential/Platelet - COMPLETE METABOLIC PANEL WITH GFR  4. Pure hypercholesterolemia -continues on crestor 5 mg  5. Coronary artery disease due to lipid rich plaque -stable without chest pains, continues on crestor 5 mg daily, on xarelto for anticoagulation. - COMPLETE METABOLIC PANEL WITH GFR  6. Primary osteoarthritis involving multiple joints Stable, occasionally will have flare of knee pain and ankle discomfort. Uses tylenol with good relief. Encouraged weight loss and strength training.   Next appt: 6 months, sooner if needed Jennifer Mcclain K. [place], [facility] [phone]

## 2019-03-19 NOTE — Patient Instructions (Signed)
Jennifer Mcclain , Thank you for taking time to come for your Medicare Wellness Visit. I appreciate your ongoing commitment to your health goals. Please review the following plan we discussed and let me know if I can assist you in the future.   Screening recommendations/referrals: Colonoscopy- cologuard due next year Mammogram up to date Bone Density DUE now, will place order Recommended yearly ophthalmology/optometry visit for glaucoma screening and checkup Recommended yearly dental visit for hygiene and checkup  Vaccinations: Influenza vaccine up to date Pneumococcal vaccine up to date Tdap vaccine up to date Shingles vaccine recommended to get shingrixTO GET AT LOCAL PHARMACY   Advanced directives: given at visit .  Conditions/risks identified: complications due to weight gain/ BMI <30. Recommend weight loss with diet and exercise.   Next appointment: 1 year.    Preventive Care 40 Years and Older, Female Preventive care refers to lifestyle choices and visits with your health care provider that can promote health and wellness. What does preventive care include?  A yearly physical exam. This is also called an annual well check.  Dental exams once or twice a year.  Routine eye exams. Ask your health care provider how often you should have your eyes checked.  Personal lifestyle choices, including:  Daily care of your teeth and gums.  Regular physical activity.  Eating a healthy diet.  Avoiding tobacco and drug use.  Limiting alcohol use.  Practicing safe sex.  Taking low-dose aspirin every day.  Taking vitamin and mineral supplements as recommended by your health care provider. What happens during an annual well check? The services and screenings done by your health care provider during your annual well check will depend on your age, overall health, lifestyle risk factors, and family history of disease. Counseling  Your health care provider may ask you questions about  your:  Alcohol use.  Tobacco use.  Drug use.  Emotional well-being.  Home and relationship well-being.  Sexual activity.  Eating habits.  History of falls.  Memory and ability to understand (cognition).  Work and work Astronomer.  Reproductive health. Screening  You may have the following tests or measurements:  Height, weight, and BMI.  Blood pressure.  Lipid and cholesterol levels. These may be checked every 5 years, or more frequently if you are over 98 years old.  Skin check.  Lung cancer screening. You may have this screening every year starting at age 46 if you have a 30-pack-year history of smoking and currently smoke or have quit within the past 15 years.  Fecal occult blood test (FOBT) of the stool. You may have this test every year starting at age 76.  Flexible sigmoidoscopy or colonoscopy. You may have a sigmoidoscopy every 5 years or a colonoscopy every 10 years starting at age 7.  Hepatitis C blood test.  Hepatitis B blood test.  Sexually transmitted disease (STD) testing.  Diabetes screening. This is done by checking your blood sugar (glucose) after you have not eaten for a while (fasting). You may have this done every 1-3 years.  Bone density scan. This is done to screen for osteoporosis. You may have this done starting at age 64.  Mammogram. This may be done every 1-2 years. Talk to your health care provider about how often you should have regular mammograms. Talk with your health care provider about your test results, treatment options, and if necessary, the need for more tests. Vaccines  Your health care provider may recommend certain vaccines, such as:  Influenza vaccine.  This is recommended every year.  Tetanus, diphtheria, and acellular pertussis (Tdap, Td) vaccine. You may need a Td booster every 10 years.  Zoster vaccine. You may need this after age 68.  Pneumococcal 13-valent conjugate (PCV13) vaccine. One dose is recommended  after age 76.  Pneumococcal polysaccharide (PPSV23) vaccine. One dose is recommended after age 20. Talk to your health care provider about which screenings and vaccines you need and how often you need them. This information is not intended to replace advice given to you by your health care provider. Make sure you discuss any questions you have with your health care provider. Document Released: 01/24/2015 Document Revised: 09/17/2015 Document Reviewed: 10/29/2014 Elsevier Interactive Patient Education  2017 [place] Prevention in the Home Falls can cause injuries. They can happen to people of all ages. There are many things you can do to make your home safe and to help prevent falls. What can I do on the outside of my home?  Regularly fix the edges of walkways and driveways and fix any cracks.  Remove anything that might make you trip as you walk through a door, such as a raised step or threshold.  Trim any bushes or trees on the path to your home.  Use bright outdoor lighting.  Clear any walking paths of anything that might make someone trip, such as rocks or tools.  Regularly check to see if handrails are loose or broken. Make sure that both sides of any steps have handrails.  Any raised decks and porches should have guardrails on the edges.  Have any leaves, snow, or ice cleared regularly.  Use sand or salt on walking paths during winter.  Clean up any spills in your garage right away. This includes oil or grease spills. What can I do in the bathroom?  Use night lights.  Install grab bars by the toilet and in the tub and shower. Do not use towel bars as grab bars.  Use non-skid mats or decals in the tub or shower.  If you need to sit down in the shower, use a plastic, non-slip stool.  Keep the floor dry. Clean up any water that spills on the floor as soon as it happens.  Remove soap buildup in the tub or shower regularly.  Attach bath mats securely with  double-sided non-slip rug tape.  Do not have throw rugs and other things on the floor that can make you trip. What can I do in the bedroom?  Use night lights.  Make sure that you have a light by your bed that is easy to reach.  Do not use any sheets or blankets that are too big for your bed. They should not hang down onto the floor.  Have a firm chair that has side arms. You can use this for support while you get dressed.  Do not have throw rugs and other things on the floor that can make you trip. What can I do in the kitchen?  Clean up any spills right away.  Avoid walking on wet floors.  Keep items that you use a lot in easy-to-reach places.  If you need to reach something above you, use a strong step stool that has a grab bar.  Keep electrical cords out of the way.  Do not use floor polish or wax that makes floors slippery. If you must use wax, use non-skid floor wax.  Do not have throw rugs and other things on the floor that can make  you trip. What can I do with my stairs?  Do not leave any items on the stairs.  Make sure that there are handrails on both sides of the stairs and use them. Fix handrails that are broken or loose. Make sure that handrails are as long as the stairways.  Check any carpeting to make sure that it is firmly attached to the stairs. Fix any carpet that is loose or worn.  Avoid having throw rugs at the top or bottom of the stairs. If you do have throw rugs, attach them to the floor with carpet tape.  Make sure that you have a light switch at the top of the stairs and the bottom of the stairs. If you do not have them, ask someone to add them for you. What else can I do to help prevent falls?  Wear shoes that:  Do not have high heels.  Have rubber bottoms.  Are comfortable and fit you well.  Are closed at the toe. Do not wear sandals.  If you use a stepladder:  Make sure that it is fully opened. Do not climb a closed stepladder.  Make  sure that both sides of the stepladder are locked into place.  Ask someone to hold it for you, if possible.  Clearly mark and make sure that you can see:  Any grab bars or handrails.  First and last steps.  Where the edge of each step is.  Use tools that help you move around (mobility aids) if they are needed. These include:  Canes.  Walkers.  Scooters.  Crutches.  Turn on the lights when you go into a dark area. Replace any light bulbs as soon as they burn out.  Set up your furniture so you have a clear path. Avoid moving your furniture around.  If any of your floors are uneven, fix them.  If there are any pets around you, be aware of where they are.  Review your medicines with your doctor. Some medicines can make you feel dizzy. This can increase your chance of falling. Ask your doctor what other things that you can do to help prevent falls. This information is not intended to replace advice given to you by your health care provider. Make sure you discuss any questions you have with your health care provider. Document Released: 10/24/2008 Document Revised: 06/05/2015 Document Reviewed: 02/01/2014 Elsevier Interactive Patient Education  2017 [employer].

## 2019-03-19 NOTE — Progress Notes (Signed)
Subjective:   Jennifer Mcclain is a 74 y.o. female who presents for Medicare Annual (Subsequent) preventive examination.  Review of Systems:         Objective:     Vitals: BP 136/80 (BP Location: Right Arm, Patient Position: Sitting, Cuff Size: Large)   Pulse 76   Temp (!) 96.8 F (36 C) (Temporal)   Ht 5\' 2"  (1.575 m)   Wt 213 lb (96.6 kg)   SpO2 99%   BMI 38.96 kg/m   Body mass index is 38.96 kg/m.  Advanced Directives 03/19/2019 03/19/2019 02/15/2018 02/07/2018 12/07/2017  Does Patient Have a Medical Advance Directive? No Yes Yes Yes No  Type of 12/09/2017 of [place];Living will Healthcare Power of [place];Living will Healthcare Power of [place];Living will Healthcare Power of [place];Living will -  Does patient want to make changes to medical advance directive? No - Patient declined No - Patient declined - No - Patient declined -  Copy of Healthcare Power of Attorney in Chart? Yes - validated most recent copy scanned in chart (See row information) Yes - validated most recent copy scanned in chart (See row information) No - copy requested No - copy requested -    Tobacco Social History   Tobacco Use  Smoking Status Never Smoker  Smokeless Tobacco Never Used     Counseling given: Not Answered   Clinical Intake:                       Past Medical History:  Diagnosis Date  . Achilles tendinitis    Per [facility] New Patient packet   . Atrial fibrillation [facility])    Per records from [facility] in [place], [place]   . Baker's cyst of knee, left    with resulting lower extremity edema  . Benign cyst of breast, left    Per records from [facility] in [place], [place]. Has yearly U/S in the past   . Cerebrovascular disease    Per [facility] New Patient packet   . Chronic atrial fibrillation [facility])    Per [facility] New Patient packet   . Chronic atrial fibrillation (HCC)    [facility], MD  . Decreased hearing, bilateral    referred to audiologist, Per records from [facility] in [place], [place]   . Disorder of bone density and structure, unspecified    Per records from [facility] in [place], [place]   . Disorder of carotid artery (HCC)    [place], [place]  . Dyslipidemia    Lynden Ang, MD  . Elevated alkaline phosphatase level    Neg ative ANA 08/2013. Per records from [facility] in [place], [place]   . H/O calculus of kidney during pregnancy    [place], [place]  . H/O hysterectomy for benign disease    Lynden Ang, MD  . History of fibula fracture    Right, Per records from [facility] in [place], [place]   . History of torn meniscus of right knee    Per records from [facility] in [place], [place]   . Humerus fracture    left leg- due to fall  . Humerus fracture    Resulting from a fall, Per records from [facility] in [place], [place]   . Hypercalcemia    Per records from [facility] in [place], [place]   . Hypertension    Per [facility] New Patient packet   .  Kidney stone on left side    Per records from [facility] in [place], [place]   . Lactose intolerance    Per [facility] New Patient packet   . Multiple actinic keratoses    Lynden Ang, MD  . Myalgia due to statin    Per records from [facility] in [place], [place]. Due to crestor   . 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 Normocytic anemia    Lynden Ang, MD  . Obstructive sleep apnea syndrome    Per records from [facility] in [place], [place]. Patient did not tolerate CPAP machine   . Onychomycosis    Per records from [facility] in [place], [place]   . Osteoarthritis    Per [facility] New Patient packet   . Osteopenia    Per records from [facility] in [place], [place]. Treated in the past with antiresorptive therapy  . Renal cyst 10/11/2012   Simple on CT Scan 10/2012. Per records from [facility] in  [place], [place]   . 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 Umbilical hernia    Per [facility] New Patient packet   . Vitamin D deficiency    Lynden Ang, MD   Past Surgical History:  Procedure Laterality Date  . ABDOMINAL HYSTERECTOMY  1989   One ovary, Per [facility] New Patient packet   . ABLATION  1996   Radical, Per [facility] New Patient packet   . BREAST CYST ASPIRATION Bilateral    numerous, over several years per pt, always benign  . COLONOSCOPY  2019   Dr.Melissa Fayrene Fearing, Per [facility] New Patient packet   . KNEE ARTHROSCOPY W/ MENISCAL REPAIR  2002   Per [facility] New Patient packet   . REPLACEMENT TOTAL KNEE Left 2015   Per [facility] New Patient packet  . RIGHT/LEFT HEART CATH AND CORONARY ANGIOGRAPHY N/A 02/07/2018   Procedure: RIGHT/LEFT HEART CATH AND CORONARY ANGIOGRAPHY;  Surgeon: [place], Peter M, MD;  Location: [facility] INVASIVE CV LAB;  Service: Cardiovascular;  Laterality: N/A;  . TONSILLECTOMY  1951   Per [facility] New Patient packet   . TOOTH EXTRACTION  01/12/2019   Family History  Problem Relation Age of Onset  . Congestive Heart Failure Mother   . Diabetes Mother 75  . Arthritis Mother 76  . Stroke Father   . Dementia Father   . Diabetes Father   . Hyperlipidemia Father   . Alzheimer's disease Father 56  . Diabetes Sister   . Cancer Maternal Grandfather   . Diabetes Maternal Grandfather 60  . CVA Maternal Grandfather   . AAA (abdominal aortic aneurysm) Sister   . Congenital heart disease Daughter   . Heart attack Maternal Aunt   . Lung cancer Maternal Grandmother 80  . Breast cancer Paternal Aunt    Social History   Socioeconomic History  . Marital status: Married    Spouse name: Not on file  . Number of children: Not on file  . Years of education: Not on file  . Highest education level: Not on file  Occupational History  . Not on file  Tobacco Use  . Smoking status: Never Smoker  . Smokeless tobacco: Never Used  Substance and Sexual Activity    . Alcohol use: Yes    Comment: 1-2 drinks weekly - social   . Drug use: Never  . Sexual activity: Not on file  Other Topics Concern  . 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 No   Do you have difficulty preparing food or eating? No   Do you have difficulty managing your medications? No   Do you have difficulty managing your finances? No   Do you have difficulty affording your medications? No   Social Determinants of Health   Financial Resource Strain:   . Difficulty of Paying Living Expenses: Not on file  Food Insecurity:   . Worried About Charity fundraiser in the Last Year: Not on file  . Ran Out of Food in the Last Year: Not on file  Transportation Needs:   . Lack of Transportation (Medical): Not on file  . Lack of Transportation (Non-Medical): Not on file  Physical Activity:   . Days of Exercise per Week: Not on file  . Minutes of Exercise per Session: Not on file  Stress:   . Feeling of Stress : Not on file  Social Connections:   . Frequency of Communication with Friends and Family: Not on file  . Frequency of Social Gatherings with Friends and Family: Not on file  . Attends Religious Services: Not on file  . Active Member of Clubs or Organizations: Not on file  . Attends Archivist Meetings: Not on file  . Marital Status: Not on file    Outpatient Encounter Medications as of 03/19/2019  Medication Sig  .  acetaminophen (TYLENOL) 650 MG CR tablet Take 650 mg by mouth in the morning, at noon, and at bedtime.   . Calcium Carb-Cholecalciferol (CALCIUM 600/VITAMIN D3 PO) Take 1 tablet by mouth 2 (two) times daily.  . Cholecalciferol (VITAMIN D3) 25 MCG (1000 UT) CAPS Take 1,000 Units by mouth daily.   Marland Kitchen ENTRESTO 49-51 MG TAKE 1 TABLET BY MOUTH 2 TIMES DAILY  . fluticasone (FLONASE) 50 MCG/ACT nasal spray Place 1 spray into both nostrils daily.  . metoprolol succinate (TOPROL-XL) 50 MG 24 hr tablet Take 50 mg by mouth 2 (two) times daily. Take with or immediately following a meal.  . Multiple Vitamins-Minerals (OCUVITE PO) Take 1 tablet by mouth daily.   . polycarbophil (FIBERCON) 625 MG tablet Take 1,250 mg by mouth daily.  . Probiotic Product (PHILLIPS COLON HEALTH PO) Take 1 capsule by mouth daily.   . rosuvastatin (CRESTOR) 5 MG tablet Take 1 tablet (5 mg total) by mouth 4 (four) times a week.  Alveda Reasons 20 MG TABS tablet TAKE 1 TABLET BY MOUTH EVERY DAY WITH SUPPER   No facility-administered encounter medications on file as of 03/19/2019.    Activities of Daily Living No flowsheet data found.  Patient Care Team: Lauree Chandler, NP as PCP - General (Geriatric Medicine) Buford Dresser, MD as PCP -  Cardiology (Cardiology)    Assessment:   This is a routine wellness examination for [place].  Exercise Activities and Dietary recommendations    Goals    . Exercise 134min/wk light-Moderate Activity     Plans to start exercise (plans to discuss with cardiologist)    . Weight (lb) < 140 lb (63.5 kg)       Fall Risk Fall Risk  03/19/2019 03/19/2019 08/16/2018 02/15/2018 12/07/2017  Falls in the past year? 0 0 0 0 1  Number falls in past yr: 0 0 0 0 0  Injury with Fall? 0 0 0 0 1  Risk for fall due to : - - - - Other (Comment)  Risk for fall due to: Comment - - - - tripped over box of decorations last December   Is the patient's home free of loose throw rugs in walkways, pet beds,  electrical cords, etc?   yes      Grab bars in the bathroom? no      Handrails on the stairs?   yes      Adequate lighting?   yes  Timed Get Up and Go performed: nA  Depression Screen PHQ 2/9 Scores 03/19/2019 03/19/2019 08/16/2018 02/15/2018  PHQ - 2 Score 0 0 0 0     Cognitive Function MMSE - Mini Mental State Exam 03/19/2019 02/15/2018  Orientation to time 5 5  Orientation to Place 5 5  Registration 3 3  Attention/ Calculation 5 5  Recall 3 3  Language- name 2 objects 2 2  Language- repeat 1 1  Language- follow 3 step command 3 1  Language- read & follow direction 1 1  Write a sentence 1 -  Copy design 1 1  Total score 30 -        Immunization History  Administered Date(s) Administered  . Fluad Quad(high Dose 65+) 09/14/2018  . Influenza, High Dose Seasonal PF 10/31/2017  . Influenza, Quadrivalent, Recombinant, Inj, Pf 10/11/2016  . Influenza,inj,quad, With Preservative 10/11/2016  . PFIZER SARS-COV-2 Vaccination 01/30/2019, 02/19/2019  . Pneumococcal Conjugate-13 11/20/2013  . Pneumococcal Polysaccharide-23 11/13/2012  . Tdap 11/27/2007, 07/05/2018  . Zoster 05/08/2016    Qualifies for Shingles Vaccine?YES   Screening Tests Health Maintenance  Topic Date Due  . Fecal DNA (Cologuard)  02/10/2020  . MAMMOGRAM  03/22/2020  . TETANUS/TDAP  07/04/2028  . INFLUENZA VACCINE  Completed  . DEXA SCAN  Completed  . Hepatitis C Screening  Completed  . PNA vac Low Risk Adult  Completed    Cancer Screenings: Lung: Low Dose CT Chest recommended if Age 13-80 years, 30 pack-year currently smoking OR have quit w/in 15years. Patient does not qualify. Breast:  Up to date on Mammogram? Yes   Up to date of Bone Density/Dexa? No Colorectal: up to date  Additional Screenings: Hepatitis C Screening: done     Plan:      I have personally reviewed and noted the following in the patient's chart:   . Medical and social history . Use of alcohol, tobacco or illicit drugs  . Current  medications and supplements . Functional ability and status . Nutritional status . Physical activity . Advanced directives . List of other physicians . Hospitalizations, surgeries, and ER visits in previous 12 months . Vitals . Screenings to include cognitive, depression, and falls . Referrals and appointments  In addition, I have reviewed and discussed with patient certain preventive protocols, quality metrics, and best practice recommendations. A written personalized care plan for preventive  services as well as general preventive health recommendations were provided to patient.     [name], NP  03/19/2019

## 2019-03-20 ENCOUNTER — Other Ambulatory Visit: Payer: Self-pay

## 2019-03-20 LAB — CBC WITH DIFFERENTIAL/PLATELET
Absolute Monocytes: 440 cells/uL (ref 200–950)
Basophils Absolute: 22 cells/uL (ref 0–200)
Basophils Relative: 0.4 %
Eosinophils Absolute: 39 cells/uL (ref 15–500)
Eosinophils Relative: 0.7 %
HCT: 41.7 % (ref 35.0–45.0)
Hemoglobin: 13.9 g/dL (ref 11.7–15.5)
Lymphs Abs: 1095 cells/uL (ref 850–3900)
MCH: 32.5 pg (ref 27.0–33.0)
MCHC: 33.3 g/dL (ref 32.0–36.0)
MCV: 97.4 fL (ref 80.0–100.0)
MPV: 12.5 fL (ref 7.5–12.5)
Monocytes Relative: 8 %
Neutro Abs: 3905 cells/uL (ref 1500–7800)
Neutrophils Relative %: 71 %
Platelets: 200 10*3/uL (ref 140–400)
RBC: 4.28 10*6/uL (ref 3.80–5.10)
RDW: 12.7 % (ref 11.0–15.0)
Total Lymphocyte: 19.9 %
WBC: 5.5 10*3/uL (ref 3.8–10.8)

## 2019-03-20 LAB — COMPLETE METABOLIC PANEL WITH GFR
AG Ratio: 1.5 (calc) (ref 1.0–2.5)
ALT: 8 U/L (ref 6–29)
AST: 15 U/L (ref 10–35)
Albumin: 4.1 g/dL (ref 3.6–5.1)
Alkaline phosphatase (APISO): 77 U/L (ref 37–153)
BUN/Creatinine Ratio: 19 (calc) (ref 6–22)
BUN: 19 mg/dL (ref 7–25)
CO2: 28 mmol/L (ref 20–32)
Calcium: 9.6 mg/dL (ref 8.6–10.4)
Chloride: 102 mmol/L (ref 98–110)
Creat: 1 mg/dL — ABNORMAL HIGH (ref 0.60–0.93)
GFR, Est African American: 66 mL/min/{1.73_m2} (ref >=60)
GFR, Est Non African American: 57 mL/min/{1.73_m2} — ABNORMAL LOW (ref >=60)
Globulin: 2.7 g/dL (calc) (ref 1.9–3.7)
Glucose, Bld: 89 mg/dL (ref 65–139)
Potassium: 4.3 mmol/L (ref 3.5–5.3)
Sodium: 140 mmol/L (ref 135–146)
Total Bilirubin: 0.5 mg/dL (ref 0.2–1.2)
Total Protein: 6.8 g/dL (ref 6.1–8.1)

## 2019-03-20 LAB — VITAMIN D 25 HYDROXY (VIT D DEFICIENCY, FRACTURES): Vit D, 25-Hydroxy: 37 ng/mL (ref 30–100)

## 2019-03-20 MED ORDER — ROSUVASTATIN CALCIUM 5 MG PO TABS: 5.0000 mg | ORAL_TABLET | ORAL | 2 refills | Status: DC

## 2019-03-21 ENCOUNTER — Other Ambulatory Visit: Payer: Self-pay | Admitting: Cardiology

## 2019-03-30 ENCOUNTER — Other Ambulatory Visit: Payer: Self-pay

## 2019-03-30 ENCOUNTER — Ambulatory Visit
Admission: RE | Admit: 2019-03-30 | Discharge: 2019-03-30 | Disposition: A | Discharge status: Home / Self Care | Payer: Medicare Other | Source: Ambulatory Visit | Attending: Nurse Practitioner | Admitting: Nurse Practitioner

## 2019-03-30 DIAGNOSIS — Z780 Asymptomatic menopausal state: Secondary | ICD-10-CM | POA: Diagnosis not present

## 2019-03-30 DIAGNOSIS — M8589 Other specified disorders of bone density and structure, multiple sites: Secondary | ICD-10-CM | POA: Diagnosis not present

## 2019-03-30 DIAGNOSIS — E2839 Other primary ovarian failure: Secondary | ICD-10-CM

## 2019-05-03 ENCOUNTER — Ambulatory Visit (INDEPENDENT_AMBULATORY_CARE_PROVIDER_SITE_OTHER): Payer: Medicare Other | Admitting: Cardiology

## 2019-05-03 ENCOUNTER — Encounter: Payer: Self-pay | Admitting: Cardiology

## 2019-05-03 ENCOUNTER — Other Ambulatory Visit: Payer: Self-pay

## 2019-05-03 VITALS — BP 130/78 | HR 77 | Temp 97.0°F | Ht 61.0 in | Wt 215.0 lb

## 2019-05-03 DIAGNOSIS — E7800 Pure hypercholesterolemia, unspecified: Secondary | ICD-10-CM

## 2019-05-03 DIAGNOSIS — I2510 Atherosclerotic heart disease of native coronary artery without angina pectoris: Secondary | ICD-10-CM | POA: Diagnosis not present

## 2019-05-03 DIAGNOSIS — Z7901 Long term (current) use of anticoagulants: Secondary | ICD-10-CM

## 2019-05-03 DIAGNOSIS — I5042 Chronic combined systolic (congestive) and diastolic (congestive) heart failure: Secondary | ICD-10-CM

## 2019-05-03 DIAGNOSIS — I10 Essential (primary) hypertension: Secondary | ICD-10-CM | POA: Diagnosis not present

## 2019-05-03 DIAGNOSIS — I482 Chronic atrial fibrillation, unspecified: Secondary | ICD-10-CM

## 2019-05-03 DIAGNOSIS — I2583 Coronary atherosclerosis due to lipid rich plaque: Secondary | ICD-10-CM | POA: Diagnosis not present

## 2019-05-03 DIAGNOSIS — I428 Other cardiomyopathies: Secondary | ICD-10-CM

## 2019-05-03 NOTE — Progress Notes (Signed)
Cardiology Office Note:    Date:  05/03/2019   ID:  Jennifer Mcclain, DOB 1945-06-01, MRN [id number]  PCP:  [name], NP  Cardiologist:  [name], MD PhD  Referring MD: [name], NP   CC: follow up  History of Present Illness:    Jennifer Mcclain is a 74 y.o. female with a hx of atrial fibrillation, hyperlipidemia, hypertension who is seen for follow up for nonischemic cardiomyopathy. Initial consult with me was on 01/17/18 for atrial fibrillation.  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 Had an ablation done, but was told part of it was too close to the His bundle and so the EP cardiologist ablated all around it but not in the bundle. Moved to [place] about 10 years ago, was told there was no evidence of WPW. She had an episode of afib about 5 years ago that was fast, but has otherwise been well controlled. She can feel when she has atrial fibrillation, usually a few times/day. Doesn't stop her from doing her daily activities. Doesn't think it lasts long. No syncope. No chest pain. Does have some shortness of breath.  Daughter passed away from heart disease; had a kink in one of her arteries, died at age 48.  Since coming to [facility], had echo with abnormal EF, moderate MR, moderate-severe TR. Cath did not show significant CAD.   Today: Blood pressure well controlled at home. Range 129/72-137/87, HR 53-91. Does feel more afib when she walks. Doesn't notice at rest.   Only mild LE edema, if any. Weight is stable, though she would like to lose weight. No PND or orthopnea, no syncope. Mild shortness of breath with exertion, stable.   Brought wrist cuff today. Checked on left arm. Initial 91/56, recheck 81/69. Manual check on same arm 146/76. Reports that she will recheck with her wrist cuff on several times due to errors or odd numbers.  Past Medical History:  Diagnosis Date  . Achilles tendinitis    Per [facility] New Patient packet   .  Atrial fibrillation [facility])    Per records from [facility] in [place], [place]   . Baker's cyst of knee, left    with resulting lower extremity edema  . Benign cyst of breast, left    Per records from [facility] in [place], [place]. Has yearly U/S in the past   . Cerebrovascular disease    Per [facility] New Patient packet   . Chronic atrial fibrillation [facility])    Per [facility] New Patient packet   . Chronic atrial fibrillation (HCC)    [name], MD  . Decreased hearing, bilateral    referred to audiologist, Per records from [facility] in [place], [place]   . Disorder of bone density and structure, unspecified    Per records from [facility] in [place], [place]   . Disorder of carotid artery (HCC)    Lynden Ang, MD  . Dyslipidemia    [name], MD  . Elevated alkaline phosphatase level    Neg ative ANA 08/2013. Per records from [facility] in [place], [place]   . H/O calculus of kidney during pregnancy    Lynden Ang, MD  . H/O hysterectomy for benign disease    Lynden Ang, MD  . History of fibula fracture    Right, Per records from [facility] in [place], [place]   . History of torn meniscus of right knee    Per records  from [facility] in [place], [place]   . Humerus fracture    left leg- due to fall  . Humerus fracture    Resulting from a fall, Per records from [facility] in [place], [place]   . Hypercalcemia    Per records from [facility] in [place], [place]   . Hypertension    Per [facility] New Patient packet   . Kidney stone on left side    Per records from [facility] in [place], [place]   . Lactose intolerance    Per [facility] New Patient packet   . Multiple actinic keratoses    Lynden Ang, MD  . Myalgia due to statin    Per records from [facility] in [place], [place]. Due to crestor   . 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 Normocytic anemia     Lynden Ang, MD  . Obstructive sleep apnea syndrome    Per records from [facility] in [place], [place]. Patient did not tolerate CPAP machine   . Onychomycosis    Per records from [facility] in [place], [place]   . Osteoarthritis    Per [facility] New Patient packet   . Osteopenia    Per records from [facility] in [place], [place]. Treated in the past with antiresorptive therapy  . Renal cyst 10/11/2012   Simple on CT Scan 10/2012. Per records from [facility] in [place], [place]   . 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 Umbilical hernia    Per [facility] New Patient packet   . Vitamin D deficiency    Lynden Ang, MD    Past Surgical History:  Procedure Laterality Date  . ABDOMINAL HYSTERECTOMY  1989   One ovary, Per [facility] New Patient packet   . ABLATION  1996   Radical, Per [facility] New Patient packet   . BREAST CYST ASPIRATION Bilateral    numerous, over several years per pt, always benign  . COLONOSCOPY  2019   Dr.Melissa Fayrene Fearing, Per [facility] New Patient packet   . KNEE ARTHROSCOPY W/ MENISCAL REPAIR  2002   Per [facility] New Patient packet   . REPLACEMENT TOTAL KNEE Left 2015   Per [facility] New Patient packet  . RIGHT/LEFT HEART CATH AND CORONARY ANGIOGRAPHY N/A 02/07/2018   Procedure: RIGHT/LEFT HEART CATH AND CORONARY ANGIOGRAPHY;  Surgeon: Swaziland, Peter M, MD;  Location: [facility] INVASIVE CV LAB;  Service: Cardiovascular;  Laterality: N/A;  . TONSILLECTOMY  1951   Per [facility] New Patient packet   . TOOTH EXTRACTION  01/12/2019    Current Medications: Current Outpatient Medications on File Prior to Visit  Medication Sig  . acetaminophen (TYLENOL) 650 MG CR tablet Take 650 mg by mouth in the morning, at noon, and at bedtime.   . Calcium Carb-Cholecalciferol (CALCIUM 600/VITAMIN D3 PO) Take 1 tablet by mouth 2 (two) times daily.  . Cholecalciferol (VITAMIN D3) 25 MCG (1000 UT) CAPS Take 1,000 Units by mouth daily.   Marland Kitchen ENTRESTO 49-51 MG  TAKE 1 TABLET BY MOUTH 2 TIMES DAILY  . fluticasone (FLONASE) 50 MCG/ACT nasal spray Place 1 spray into both nostrils daily.  . metoprolol succinate (TOPROL-XL) 50 MG 24 hr tablet Take 1 tablet (50 mg total) by mouth 2 (two) times daily. Take with or immediately following a meal.  . Multiple Vitamins-Minerals (OCUVITE PO) Take 1 tablet by mouth daily.   . polycarbophil (FIBERCON) 625 MG  tablet Take 1,250 mg by mouth daily.  . Probiotic Product (PHILLIPS COLON HEALTH PO) Take 1 capsule by mouth daily.   . rosuvastatin (CRESTOR) 5 MG tablet Take 1 tablet (5 mg total) by mouth 4 (four) times a week.  Carlena Hurl 20 MG TABS tablet TAKE 1 TABLET BY MOUTH EVERY DAY WITH SUPPER   No current facility-administered medications on file prior to visit.     Allergies:   Adhesive [tape], Grass extracts [gramineae pollens], Lactose intolerance (gi), Naprosyn [naproxen], Coq10 [coenzyme q10], Latex, and Zocor [simvastatin]   Social History   Tobacco Use  . Smoking status: Never Smoker  . Smokeless tobacco: Never Used  Substance Use Topics  . Alcohol use: Yes    Comment: 1-2 drinks weekly - social   . Drug use: Never    Family History: The patient's family history includes AAA (abdominal aortic aneurysm) in her sister; Alzheimer's disease (age of onset: 67) in her father; Arthritis (age of onset: 63) in her mother; Breast cancer in her paternal aunt; CVA in her maternal grandfather; Cancer in her maternal grandfather; Congenital heart disease in her daughter; Congestive Heart Failure in her mother; Dementia in her father; Diabetes in her father and sister; Diabetes (age of onset: 81) in her mother; Diabetes (age of onset: 39) in her maternal grandfather; Heart attack in her maternal aunt; Hyperlipidemia in her father; Lung cancer (age of onset: 65) in her maternal grandmother; Stroke in her father.  ROS:   Please see the history of present illness.  Additional pertinent ROS otherwise  unremarkable   EKGs/Labs/Other Studies Reviewed:    The following studies were reviewed today: Monitor 02/13/18 ~3 days of data recorded on Zio monitor. No high degree block or pauses noted. Isolated atrial and ventricular ectopy was rare (<1%). There were 2 patient triggered events, one SVT and one sinus rhythm with PVC.   1 run of tachycardia occurred lasting 11 beats with a max rate of 200 bpm (avg 168 bpm). It is labeled as VT on the monitor. While VT cannot be excluded, it appears likely atrial fibrillation with rate related aberrancy. Atrial Fibrillation occurred continuously (100% burden), ranging from 51-163 bpm (avg of 92 bpm). Isolated VEs were occasional (2.7%), VE Couplets were rare (<1.0%), and VE Triplets were rare (<1.0%, 1). Ventricular Bigeminy and Trigeminy were rare, with individual episodes lasting 5 seconds or less.  Cath 02/07/18  Prox LAD lesion is 20% stenosed.  LV end diastolic pressure is mildly elevated.  There is moderate left ventricular systolic dysfunction.  Hemodynamic findings consistent with mild pulmonary hypertension.   1. Minimal nonobstructive CAD with calcified plaque in the proximal LAD 2. Moderate LV dysfunction. EF estimated at 40-45%.  3. Mildly elevated LV filling pressures. V waves prominent on PCWP tracing 4. Mild pulmonary HTN 5. Low cardiac output. Index 2.06. 6. Mild to Moderate MR.   Plan: Optimize medical therapy for CHF. Consider repeat Echo once medical therapy optimized to reassess valve regurgitation.   Fick Cardiac Output 3.88 L/min  Fick Cardiac Output Index 2.06 (L/min)/BSA  RA A Wave -99 mmHg  RA V Wave 11 mmHg  RA Mean 9 mmHg  RV Systolic Pressure 44 mmHg  RV Diastolic Pressure 2 mmHg  RV EDP 9 mmHg  PA Systolic Pressure 47 mmHg  PA Diastolic Pressure 13 mmHg  PA Mean 34 mmHg  PW A Wave -99 mmHg  PW V Wave 32 mmHg  PW Mean 21 mmHg  AO Systolic Pressure 137 mmHg  AO Diastolic Pressure 73 mmHg  AO Mean 99 mmHg  LV  Systolic Pressure 137 mmHg  LV Diastolic Pressure 8 mmHg  LV EDP 18 mmHg  AOp Systolic Pressure 138 mmHg  AOp Diastolic Pressure 67 mmHg  AOp Mean Pressure 94 mmHg  LVp Systolic Pressure 148 mmHg  LVp Diastolic Pressure 10 mmHg  LVp EDP Pressure 15 mmHg  QP/QS 1  TPVR Index 16.45 HRUI  TSVR Index 47.91 HRUI  PVR SVR Ratio 0.1  TPVR/TSVR Ratio 0.34     Echo 01/25/18 Study Conclusions  - Left ventricle: The cavity size was normal. Systolic function was   moderately reduced. The estimated ejection fraction was in the   range of 35% to 40%. Although no diagnostic regional wall motion   abnormality was identified, this possibility cannot be completely   excluded on the basis of this study. - Aortic valve: Transvalvular velocity was within the normal range.   There was no stenosis. There was no regurgitation. - Mitral valve: Moderately calcified annulus. There was moderate   regurgitation. - Left atrium: The atrium was moderately dilated. - Right ventricle: The cavity size was mildly dilated. Wall   thickness was normal. Systolic function was normal. RV systolic   pressure (S, est): 44 mm Hg. - Right atrium: The atrium was moderately dilated. Central venous   pressure (est): 10 mm Hg. - Tricuspid valve: Dilated annulus. Structurally normal valve.   There was moderate-severe regurgitation. - Pulmonic valve: There was mild regurgitation. - Pulmonary arteries: Systolic pressure was moderately increased.   PA peak pressure: 44 mm Hg (S). - Inferior vena cava: The vessel was normal in size. The   respirophasic diameter changes were blunted (< 50%). - Pericardium, extracardiac: There was no pericardial effusion.  EKG:  EKG is personally reviewed.  The ekg ordered today demonstrates atrial fibrillation with heart rate 77 bpm.   Recent Labs: 03/19/2019: ALT 8; BUN 19; Creat 1.00; Hemoglobin 13.9; Platelets 200; Potassium 4.3; Sodium 140  Recent Lipid Panel    Component Value  Date/Time   CHOL 224 (H) 08/16/2018 1138   TRIG 78 08/16/2018 1138   HDL 96 08/16/2018 1138   CHOLHDL 2.3 08/16/2018 1138   LDLCALC 111 (H) 08/16/2018 1138    Physical Exam:    VS:  BP 130/78   Pulse 77   Temp (!) 97 F (36.1 C)   Ht 5\' 1"  (1.549 m)   Wt 215 lb (97.5 kg)   SpO2 98%   BMI 40.62 kg/m     Wt Readings from Last 3 Encounters:  05/03/19 215 lb (97.5 kg)  03/19/19 213 lb (96.6 kg)  03/19/19 213 lb (96.6 kg)    GEN: Well nourished, well developed in no acute distress HEENT: Normal, moist mucous membranes NECK: No JVD CARDIAC: rate controlled irregularly irregular rhythm, normal S1 and S2, no rubs or gallops. No murmur. VASCULAR: Radial and DP pulses 2+ bilaterally. No carotid bruits RESPIRATORY:  Clear to auscultation without rales, wheezing or rhonchi  ABDOMEN: Soft, non-tender, non-distended MUSCULOSKELETAL:  Ambulates independently SKIN: Warm and dry, no edema NEUROLOGIC:  Alert and oriented x 3. No focal neuro deficits noted. PSYCHIATRIC:  Normal affect   ASSESSMENT:    1. Nonischemic cardiomyopathy (HCC)   2. Essential hypertension   3. Pure hypercholesterolemia   4. Chronic atrial fibrillation (HCC)   5. Chronic combined systolic and diastolic heart failure (HCC)   6. Anticoagulant long-term use    PLAN:    Nonischemiccardiomyopathywith reduced EF, moderate  MR, and moderate-severe TD:[id number] on most recent echo. NYHA class II symptoms -cath with minimal nonobstructive CAD -MR more mild-moderate on cath; improved MR and TR on most recent echo -tolerating entresto well. Did discuss that even with improvement in EF, recommendations are to continue entresto given good response. -continue metoprolol succinate  Atrial fibrillation:now permanent:  -monitor 01/2018 showed 100% burden of afib.Repeat monitor showed the same 01/2019. -we discussed rate vs rhythm control strategy. Amiodarone has not kept her in sinus rhythm, and this was discontinued.  Declines cardioversion, antiarrhythmic, discussion for repeat ablation as overall she feels well. -rate control with metoprolol -CHA2DS2/VAS Stroke Risk Points=3, continue rivaroxaban for anticoagulation  Hypertension:home cuff not accurate (wrist) -recommended arm cuff, gave suggestions today -check and log home BPs - Continue entresto and metoprolol  Hyperlipidemia(hypercholesterolemia):  -continue rosuvastatin, tolerating 4x/week  Follow up: 6 mos or sooner as needed  Buford Dresser, MD, PhD   [facility]   Medication Adjustments/Labs and Tests Ordered: Current medicines are reviewed at length with the patient today.  Concerns regarding medicines are outlined above.  No orders of the defined types were placed in this encounter.  No orders of the defined types were placed in this encounter.   Patient Instructions  Medication Instructions:  Your Physician recommend you continue on your current medication as directed.    *If you need a refill on your cardiac medications before your next appointment, please call your pharmacy*   Lab Work: None   Testing/Procedures: None   Follow-Up: At [facility], you and your health needs are our priority.  As part of our continuing mission to provide you with exceptional heart care, we have created designated Provider Care Teams.  These Care Teams include your primary Cardiologist (physician) and Advanced Practice Providers (APPs -  Physician Assistants and Nurse Practitioners) who all work together to provide you with the care you need, when you need it.  We recommend signing up for the patient portal called "MyChart".  Sign up information is provided on this After Visit Summary.  MyChart is used to connect with patients for Virtual Visits (Telemedicine).  Patients are able to view lab/test results, encounter notes, upcoming appointments, etc.  Non-urgent messages can be sent to your provider as well.   To  learn more about what you can do with MyChart, go to [email or web address].    Your next appointment:   6 month(s)  The format for your next appointment:   In Person  Provider:   Buford Dresser, MD       Signed, Buford Dresser, MD PhD 05/03/2019  [place]

## 2019-05-03 NOTE — Patient Instructions (Signed)

## 2019-05-04 ENCOUNTER — Encounter: Payer: Self-pay | Admitting: Cardiology

## 2019-05-04 DIAGNOSIS — I5042 Chronic combined systolic (congestive) and diastolic (congestive) heart failure: Secondary | ICD-10-CM | POA: Insufficient documentation

## 2019-05-04 DIAGNOSIS — Z7901 Long term (current) use of anticoagulants: Secondary | ICD-10-CM | POA: Insufficient documentation

## 2019-05-09 ENCOUNTER — Other Ambulatory Visit (INDEPENDENT_AMBULATORY_CARE_PROVIDER_SITE_OTHER): Payer: Medicare Other

## 2019-05-09 DIAGNOSIS — I10 Essential (primary) hypertension: Secondary | ICD-10-CM

## 2019-06-05 HISTORY — PX: TOOTH EXTRACTION: SUR596

## 2019-06-09 ENCOUNTER — Ambulatory Visit (INDEPENDENT_AMBULATORY_CARE_PROVIDER_SITE_OTHER): Payer: Medicare Other

## 2019-06-09 ENCOUNTER — Ambulatory Visit (HOSPITAL_COMMUNITY)
Admission: EM | Admit: 2019-06-09 | Discharge: 2019-06-09 | Disposition: A | Discharge status: Home / Self Care | Payer: Medicare Other

## 2019-06-09 ENCOUNTER — Other Ambulatory Visit: Payer: Self-pay

## 2019-06-09 ENCOUNTER — Encounter (HOSPITAL_COMMUNITY): Payer: Self-pay | Admitting: Emergency Medicine

## 2019-06-09 DIAGNOSIS — M79641 Pain in right hand: Secondary | ICD-10-CM

## 2019-06-09 DIAGNOSIS — M25531 Pain in right wrist: Secondary | ICD-10-CM | POA: Diagnosis not present

## 2019-06-09 DIAGNOSIS — S63501A Unspecified sprain of right wrist, initial encounter: Secondary | ICD-10-CM

## 2019-06-09 DIAGNOSIS — M7989 Other specified soft tissue disorders: Secondary | ICD-10-CM | POA: Diagnosis not present

## 2019-06-09 MED ORDER — TRAMADOL HCL 50 MG PO TABS: 50.0000 mg | ORAL_TABLET | Freq: Four times a day (QID) | ORAL | 0 refills | Status: DC | PRN

## 2019-06-09 NOTE — Discharge Instructions (Addendum)
Take the Tramadol as prescribed. Rest and elevate your hand. Apply ice packs 2-3 times a day for up to 20 minutes each. Wear the wrist brace when active and at night, also for comfort.    Follow up with your primary care provider or an orthopedist if you symptoms continue or worsen;  Or if you develop new symptoms, such as numbness, tingling, or weakness.

## 2019-06-09 NOTE — ED Provider Notes (Signed)
[facility]   [id number] 06/09/19 Arrival Time: 1053  LF:YBOFB PAIN  SUBJECTIVE: History from: patient. Jennifer Mcclain is a 74 y.o. female complains of right wrist pain that began yesterday after a fall on her patio. Localizes the pain to the lateral aspect of the dorsum of the R hand and ulnar aspect of the R wrist. Describes the pain as intermittent and achy in character. Has tried OTC medications without relief. Symptoms are made worse with activity. Denies similar symptoms in the past. Denies fever, chills, erythema, ecchymosis, effusion, weakness, numbness and tingling, saddle paresthesias, loss of bowel or bladder function.      ROS: As per HPI.  All other pertinent ROS negative.     Past Medical History:  Diagnosis Date  . Achilles tendinitis    Per [facility] New Patient packet   . Atrial fibrillation [facility])    Per records from [facility] in [place], [place]   . Baker's cyst of knee, left    with resulting lower extremity edema  . Benign cyst of breast, left    Per records from [facility] in [place], [place]. Has yearly U/S in the past   . Cerebrovascular disease    Per [facility] New Patient packet   . Chronic atrial fibrillation [facility])    Per [facility] New Patient packet   . Chronic atrial fibrillation (HCC)    [name], MD  . Decreased hearing, bilateral    referred to audiologist, Per records from [facility] in [place], [place]   . Disorder of bone density and structure, unspecified    Per records from [facility] in [place], [place]   . Disorder of carotid artery (HCC)    Lynden Ang, MD  . Dyslipidemia    [name], MD  . Elevated alkaline phosphatase level    Neg ative ANA 08/2013. Per records from [facility] in [place], [place]   . H/O calculus of kidney during pregnancy    Lynden Ang, MD  . H/O hysterectomy for benign disease    Lynden Ang, MD  . History of fibula fracture    Right, Per records from  [facility] in [place], [place]   . History of torn meniscus of right knee    Per records from [facility] in [place], [place]   . Humerus fracture    left leg- due to fall  . Humerus fracture    Resulting from a fall, Per records from [facility] in [place], [place]   . Hypercalcemia    Per records from [facility] in [place], [place]   . Hypertension    Per [facility] New Patient packet   . Kidney stone on left side    Per records from [facility] in [place], [place]   . Lactose intolerance    Per [facility] New Patient packet   . Multiple actinic keratoses    Lynden Ang, MD  . Myalgia due to statin    Per records from [facility] in [place], [place]. Due to crestor   . 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 Normocytic anemia    Lynden Ang, MD  . Obstructive sleep apnea syndrome    Per records from [facility] in [place], [place]. Patient did not tolerate CPAP machine   . Onychomycosis    Per records from [facility] in [place], [place]   . Osteoarthritis    Per [facility] New Patient  packet   . Osteopenia    Per records from [facility] in [place], [place]. Treated in the past with antiresorptive therapy  . Renal cyst 10/11/2012   Simple on CT Scan 10/2012. Per records from [facility] in [place], [place]   . 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 Umbilical hernia    Per [facility] New Patient packet   . Vitamin D deficiency    Lynden Ang, MD   Past Surgical History:  Procedure Laterality Date  . ABDOMINAL HYSTERECTOMY  1989   One ovary, Per [facility] New Patient packet   . ABLATION  1996   Radical, Per [facility] New Patient packet   . BREAST CYST ASPIRATION Bilateral    numerous, over several years per pt, always benign  . COLONOSCOPY  2019   Dr.Melissa Fayrene Fearing, Per [facility] New Patient packet   . KNEE ARTHROSCOPY W/ MENISCAL REPAIR  2002   Per [facility] New  Patient packet   . REPLACEMENT TOTAL KNEE Left 2015   Per [facility] New Patient packet  . RIGHT/LEFT HEART CATH AND CORONARY ANGIOGRAPHY N/A 02/07/2018   Procedure: RIGHT/LEFT HEART CATH AND CORONARY ANGIOGRAPHY;  Surgeon: Swaziland, Peter M, MD;  Location: [facility] INVASIVE CV LAB;  Service: Cardiovascular;  Laterality: N/A;  . TONSILLECTOMY  1951   Per [facility] New Patient packet   . TOOTH EXTRACTION  01/12/2019   Allergies  Allergen Reactions  . Adhesive [Tape]     Skin irritation   . Grass Extracts [Gramineae Pollens]      Per records from from [facility]  . Lactose Intolerance (Gi) Other (See Comments)    Unknown  . Naprosyn [Naproxen] Hives  . Coq10 [Coenzyme Q10] Other (See Comments)    Cramps   . Latex Rash  . Zocor [Simvastatin] Other (See Comments)    cramps   No current facility-administered medications on file prior to encounter.   Current Outpatient Medications on File Prior to Encounter  Medication Sig Dispense Refill  . amoxicillin (AMOXIL) 500 MG capsule Take 500 mg by mouth 3 (three) times daily.    Marland Kitchen acetaminophen (TYLENOL) 650 MG CR tablet Take 650 mg by mouth in the morning, at noon, and at bedtime.     . Calcium Carb-Cholecalciferol (CALCIUM 600/VITAMIN D3 PO) Take 1 tablet by mouth 2 (two) times daily.    . Cholecalciferol (VITAMIN D3) 25 MCG (1000 UT) CAPS Take 1,000 Units by mouth daily.     Marland Kitchen ENTRESTO 49-51 MG TAKE 1 TABLET BY MOUTH 2 TIMES DAILY 60 tablet 9  . fluticasone (FLONASE) 50 MCG/ACT nasal spray Place 1 spray into both nostrils daily.    . metoprolol succinate (TOPROL-XL) 50 MG 24 hr tablet Take 1 tablet (50 mg total) by mouth 2 (two) times daily. Take with or immediately following a meal. 180 tablet 1  . Multiple Vitamins-Minerals (OCUVITE PO) Take 1 tablet by mouth daily.     . polycarbophil (FIBERCON) 625 MG tablet Take 1,250 mg by mouth daily.    . Probiotic Product (PHILLIPS COLON HEALTH PO) Take 1 capsule by mouth daily.     . rosuvastatin  (CRESTOR) 5 MG tablet Take 1 tablet (5 mg total) by mouth 4 (four) times a week. 16 tablet 2  . XARELTO 20 MG TABS tablet TAKE 1 TABLET BY MOUTH EVERY DAY WITH SUPPER 30 tablet 5   Social History   Socioeconomic History  . Marital status: Married  Spouse name: Not on file  . Number of children: Not on file  . Years of education: Not on file  . Highest education level: Not on file  Occupational History  . Not on file  Tobacco Use  . Smoking status: Never Smoker  . Smokeless tobacco: Never Used  Substance and Sexual Activity  . Alcohol use: Yes    Comment: 1-2 drinks weekly - social   . Drug use: Never  . Sexual activity: Not on file  Other Topics Concern  . Not on file  Social History Narrative   Per 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 No   Do you have difficulty preparing food or eating? No   Do you have difficulty managing your medications? No   Do you have difficulty managing your finances? No   Do you have difficulty affording your medications? No   Social Determinants of Health   Financial Resource Strain:   . Difficulty of Paying Living Expenses:   Food Insecurity:   . Worried About Charity fundraiser in the Last Year:   . Arboriculturist in the Last Year:   Transportation Needs:   . Film/video editor (Medical):   Marland Kitchen Lack of Transportation (Non-Medical):   Physical Activity:   . Days of Exercise per Week:   . Minutes of Exercise per Session:   Stress:    . Feeling of Stress :   Social Connections:   . Frequency of Communication with Friends and Family:   . Frequency of Social Gatherings with Friends and Family:   . Attends Religious Services:   . Active Member of Clubs or Organizations:   . Attends Archivist Meetings:   Marland Kitchen Marital Status:   Intimate Partner Violence:   . Fear of Current or Ex-Partner:   . Emotionally Abused:   Marland Kitchen Physically Abused:   . Sexually Abused:    Family History  Problem Relation Age of Onset  . Congestive Heart Failure Mother   . Diabetes Mother 56  . Arthritis Mother 82  . Stroke Father   . Dementia Father   . Diabetes Father   . Hyperlipidemia Father   . Alzheimer's disease Father 11  . Diabetes Sister   . Cancer Maternal Grandfather   . Diabetes Maternal Grandfather 60  . CVA Maternal Grandfather   . AAA (abdominal aortic aneurysm) Sister   . Congenital heart disease Daughter   . Heart attack Maternal Aunt   . Lung cancer Maternal Grandmother 80  . Breast cancer Paternal Aunt     OBJECTIVE:  Vitals:   06/09/19 1132  BP: 132/68  Pulse: 90  Resp: 18  Temp: 98.5 F (36.9 C)  TempSrc: Oral  SpO2: 98%    General appearance: ALERT; in  no acute distress.  Head: NCAT Lungs: Normal respiratory effort CV: radial pulses 2+ bilaterally. Cap refill < 2 seconds Musculoskeletal:  Inspection: Skin warm, dry, clear and intact without obvious erythema, effusion, or ecchymosis.  Palpation: Tender to palpation along lateral dorsum of L hand ROM: FROM active and passive Strength:5/5 grip strength to R hand, 4/5 grip strength to L hand Skin: warm and dry, ecchymosis to dorsum of L hand, swelling to entire L and and L wrist Neurologic: Ambulates without difficulty; Sensation intact Psychological: alert and cooperative; normal mood and affect  DIAGNOSTIC STUDIES:  DG Hand Complete Right  Result Date: 06/09/2019 CLINICAL DATA:  Fall yesterday. Right hand pain and swelling. Initial  encounter. EXAM: RIGHT HAND - COMPLETE 3+ VIEW COMPARISON:  None. FINDINGS: There is no evidence of fracture or dislocation. Moderate osteoarthritis is seen involving the distal interphalangeal joints, most severe at the index and middle fingers. Mild osteoarthritis is also seen involving the STT joint complex in the wrist. IMPRESSION: 1. No acute findings. 2. Osteoarthritis. Electronically Signed   By: Danae Orleans M.D.   On: 06/09/2019 12:30     ASSESSMENT & PLAN:  1. Right wrist pain   2. Right hand pain      Meds ordered this encounter  Medications  . traMADol (ULTRAM) 50 MG tablet    Sig: Take 1 tablet (50 mg total) by mouth every 6 (six) hours as needed.    Dispense:  15 tablet    Refill:  0    Order Specific Question:   Supervising Provider    Answer:   Merrilee Jansky [id number]    R wrist sprain Tramadol prescribed, one tablet every 6h prn pain Continue conservative management of rest, ice, and gentle stretches Xray of R hand and wrist negative today Brace applied Follow up with orthopedics if symptoms persist Return or go to the ER if you have any new or worsening symptoms (fever, chills, chest pain, abdominal pain, changes in bowel or bladder habits, pain radiating into lower legs)   [place] Controlled Substances Registry consulted for this patient. I feel the risk/benefit ratio today is favorable for proceeding with this prescription for a controlled substance. Medication sedation precautions given.  Reviewed expectations re: course of current medical issues. Questions answered. Outlined signs and symptoms indicating need for more acute intervention. Patient verbalized understanding. After Visit Summary given.       Moshe Cipro, NP 06/09/19 1253

## 2019-06-09 NOTE — ED Triage Notes (Signed)
Pt sts trip and fall yesterday with right wrist pain and some bruising noted; pt sts already had some bruising from IV attempt this week

## 2019-07-24 ENCOUNTER — Other Ambulatory Visit: Payer: Self-pay | Admitting: Cardiology

## 2019-07-24 ENCOUNTER — Other Ambulatory Visit: Payer: Self-pay | Admitting: Nurse Practitioner

## 2019-07-24 DIAGNOSIS — I428 Other cardiomyopathies: Secondary | ICD-10-CM

## 2019-07-24 DIAGNOSIS — I482 Chronic atrial fibrillation, unspecified: Secondary | ICD-10-CM

## 2019-09-04 ENCOUNTER — Other Ambulatory Visit: Payer: Self-pay | Admitting: Cardiology

## 2019-09-14 ENCOUNTER — Other Ambulatory Visit: Payer: Self-pay | Admitting: Cardiology

## 2019-09-14 NOTE — Telephone Encounter (Signed)
Prescription refill request for Xarelto received.  Indication: Atrial Fibrillation Last office visit:05/03/2019 Christopher Weight:97.5 kg Age:74 Scr: 1.0 03/19/2019 CrCl: 92.08 ml/min  Prescription refilled

## 2019-09-23 IMAGING — MG DIGITAL SCREENING BILATERAL MAMMOGRAM WITH TOMO AND CAD
8 series · 8 of 24 positions shown · non-contrast
Comparison: Previous exam(s).

CLINICAL DATA: Screening.

EXAM:
DIGITAL SCREENING BILATERAL MAMMOGRAM WITH TOMO AND CAD

[R CC synth-2D]
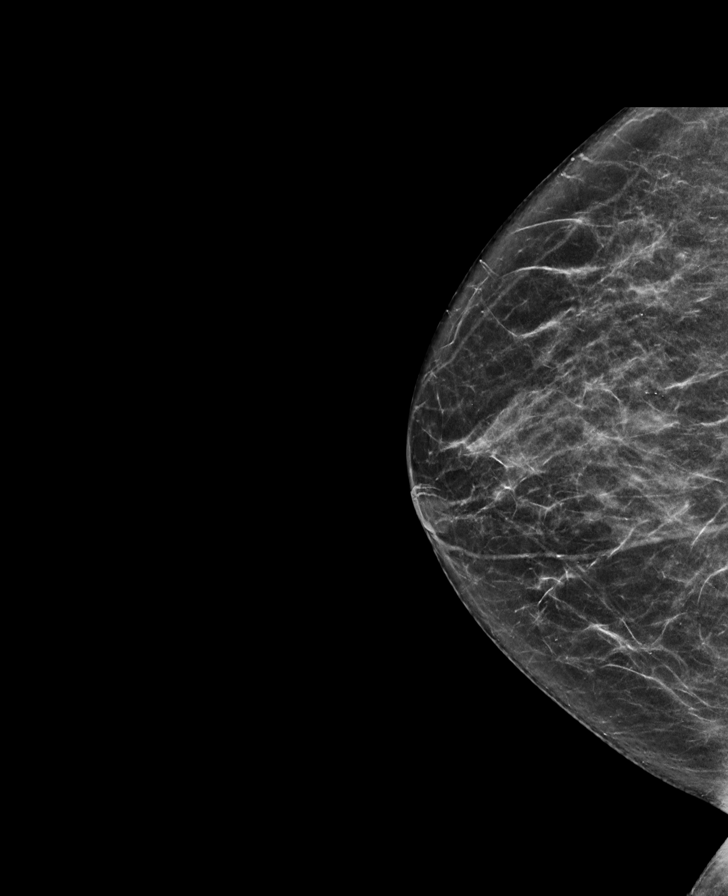

[L CC synth-2D]
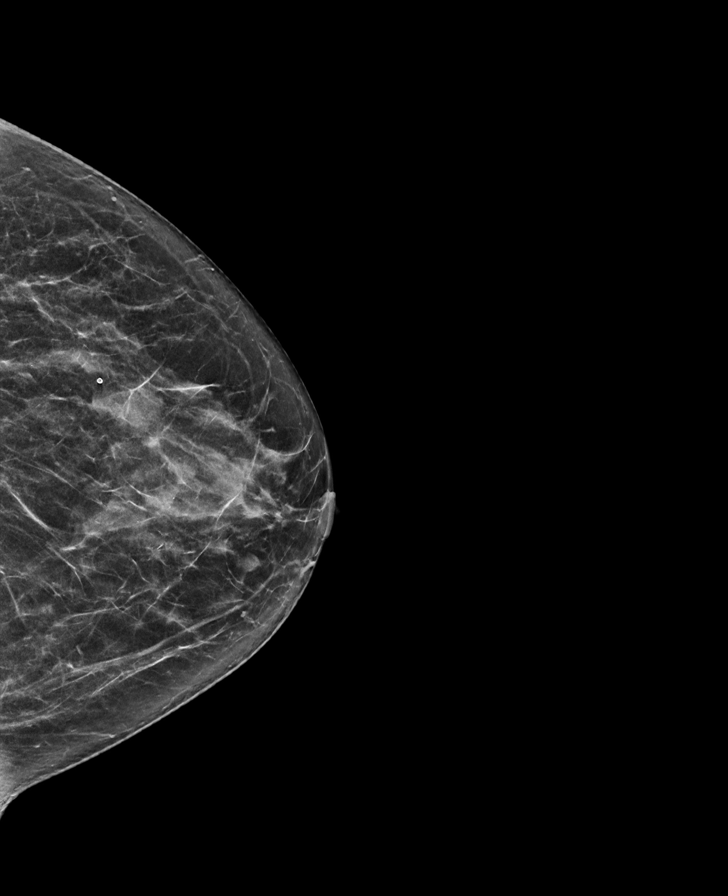

[L MLO synth-2D]
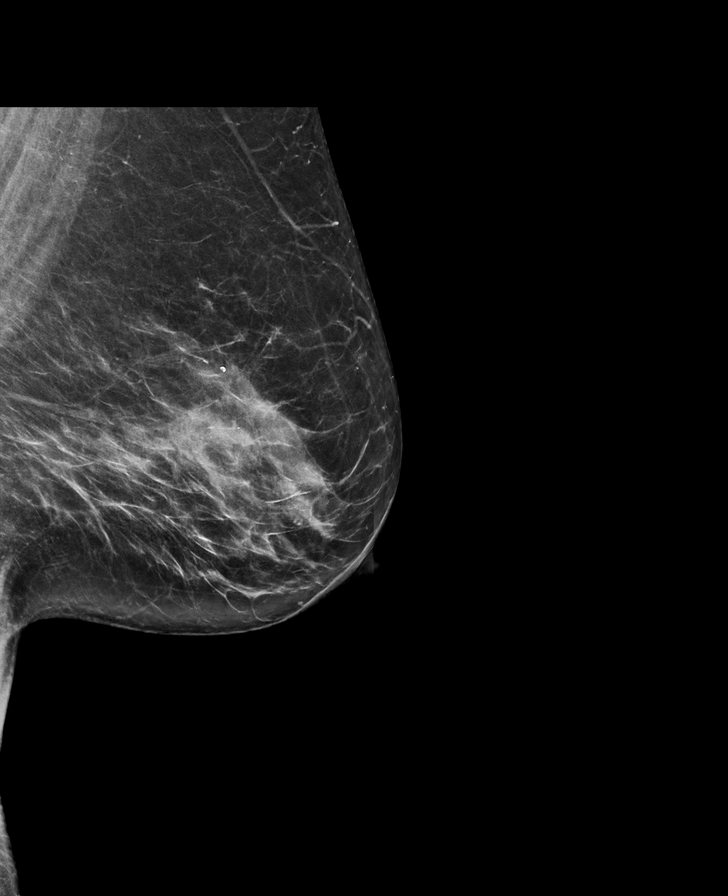

[R MLO synth-2D]
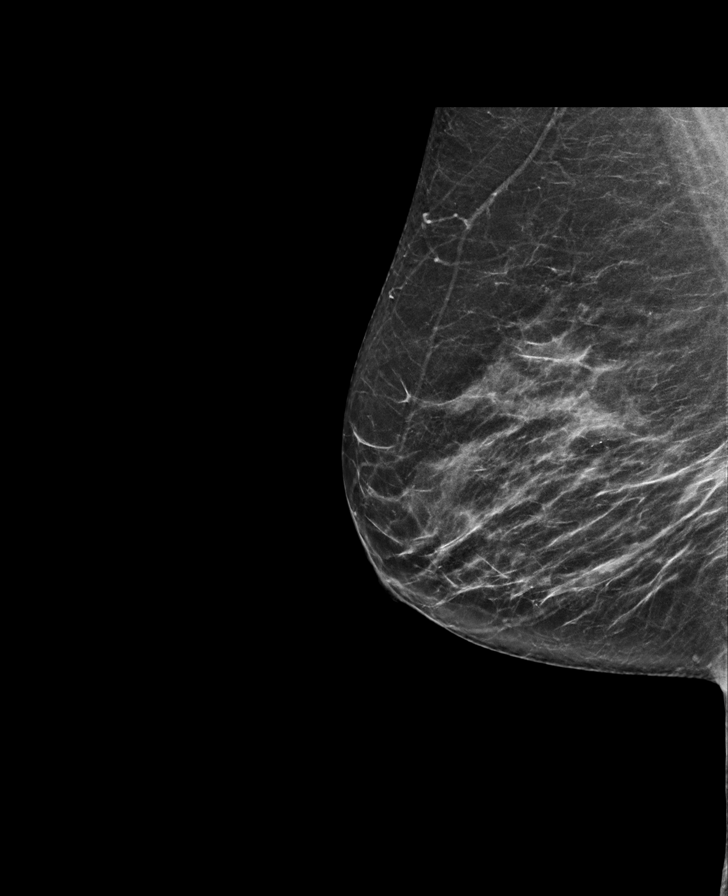

[R MLO tomo · tomo slice 36/71.0]
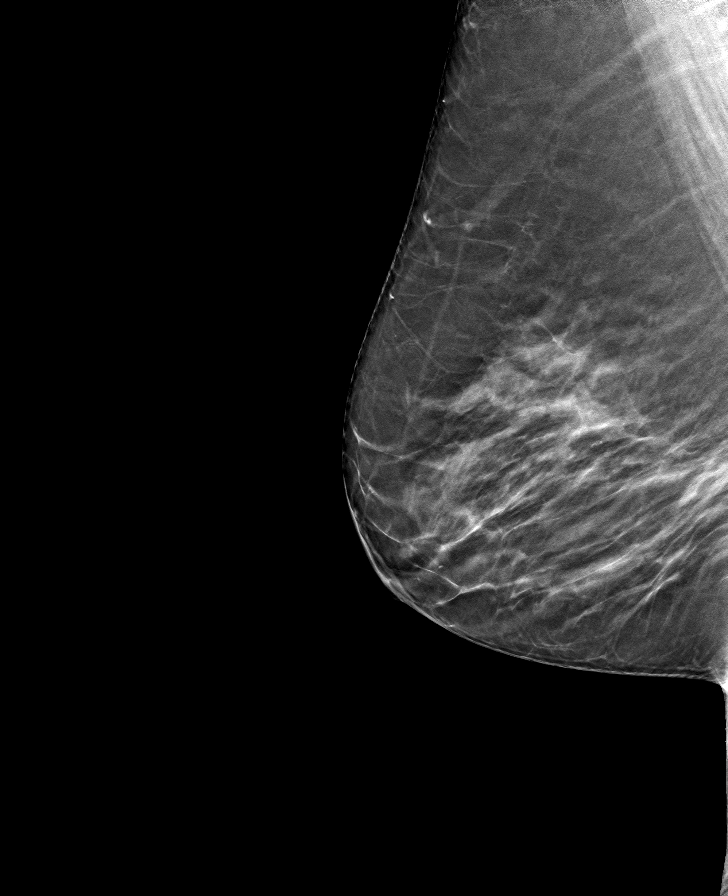

[L CC tomo · tomo slice 32/63.0]
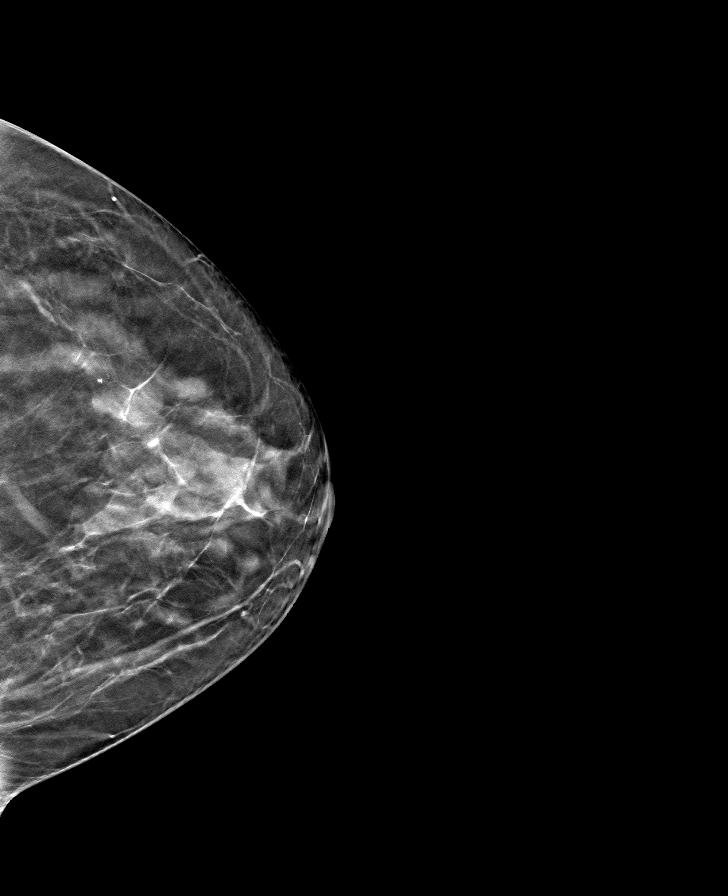

[L MLO tomo · tomo slice 39/77.0]
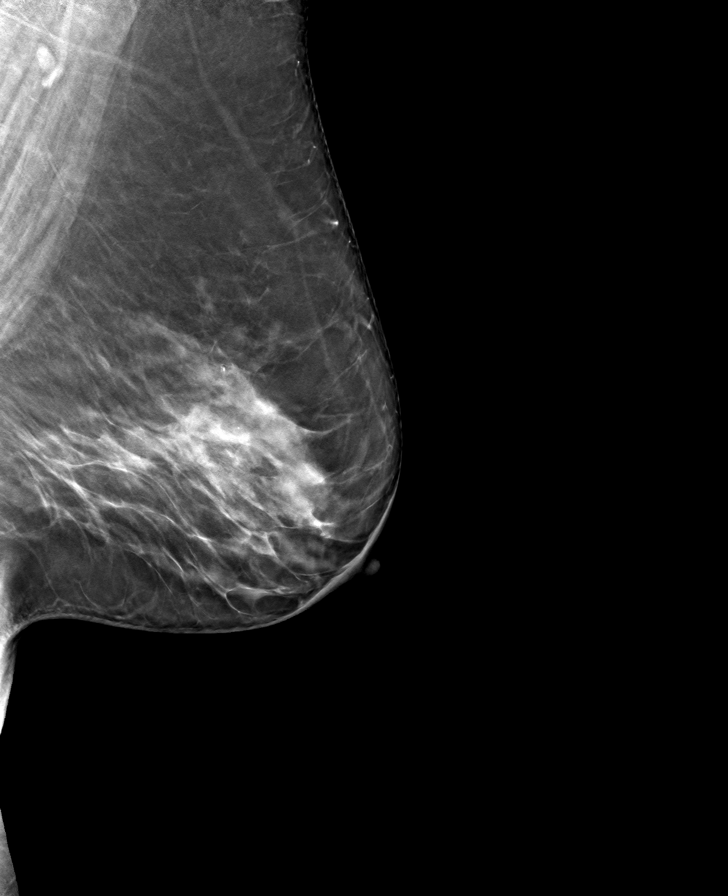

[R CC tomo · tomo slice 33/65.0]
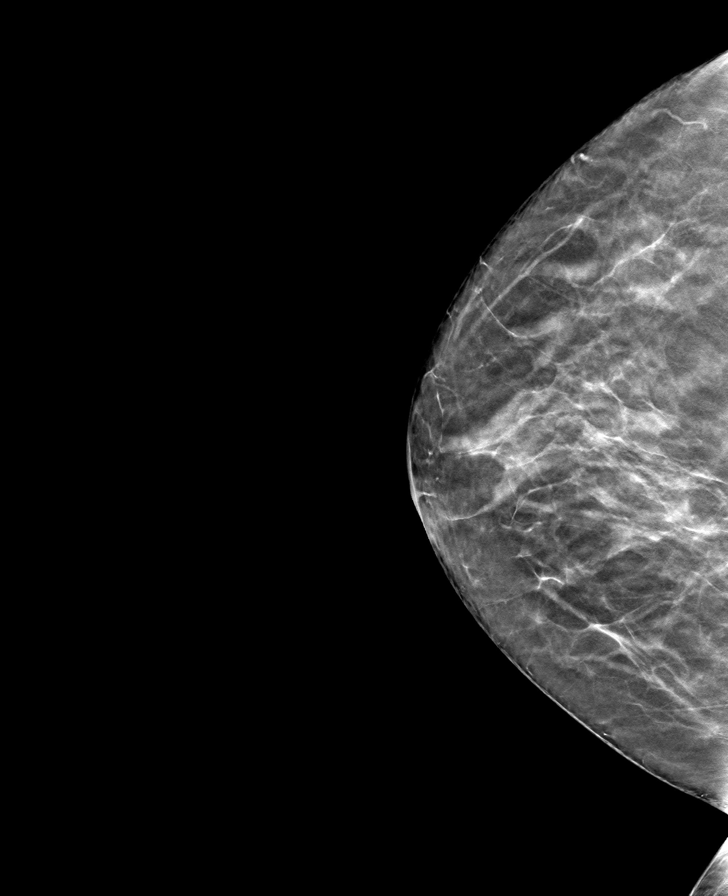

[8 of 24 positions shown; findings below may reference images not displayed]

ACR Breast Density Category c: The breast tissue is heterogeneously
dense, which may obscure small masses.
FINDINGS: There are no findings suspicious for malignancy. Images were
processed with CAD.
IMPRESSION: No mammographic evidence of malignancy. A result letter of this
screening mammogram will be mailed directly to the patient.

RECOMMENDATION:
Screening mammogram in one year. (Code:FT-U-LHB)

BI-RADS CATEGORY  1: Negative.

## 2019-09-24 ENCOUNTER — Other Ambulatory Visit: Payer: Self-pay

## 2019-09-24 ENCOUNTER — Encounter: Payer: Self-pay | Admitting: Nurse Practitioner

## 2019-09-24 ENCOUNTER — Ambulatory Visit (INDEPENDENT_AMBULATORY_CARE_PROVIDER_SITE_OTHER): Payer: Medicare Other | Admitting: Nurse Practitioner

## 2019-09-24 VITALS — BP 122/78 | HR 87 | Temp 96.6°F | Ht 61.0 in | Wt 216.0 lb

## 2019-09-24 DIAGNOSIS — Z23 Encounter for immunization: Secondary | ICD-10-CM | POA: Diagnosis not present

## 2019-09-24 DIAGNOSIS — I10 Essential (primary) hypertension: Secondary | ICD-10-CM

## 2019-09-24 DIAGNOSIS — M8949 Other hypertrophic osteoarthropathy, multiple sites: Secondary | ICD-10-CM | POA: Diagnosis not present

## 2019-09-24 DIAGNOSIS — H44001 Unspecified purulent endophthalmitis, right eye: Secondary | ICD-10-CM | POA: Diagnosis not present

## 2019-09-24 DIAGNOSIS — I428 Other cardiomyopathies: Secondary | ICD-10-CM | POA: Diagnosis not present

## 2019-09-24 DIAGNOSIS — I2583 Coronary atherosclerosis due to lipid rich plaque: Secondary | ICD-10-CM

## 2019-09-24 DIAGNOSIS — I482 Chronic atrial fibrillation, unspecified: Secondary | ICD-10-CM | POA: Diagnosis not present

## 2019-09-24 DIAGNOSIS — I2510 Atherosclerotic heart disease of native coronary artery without angina pectoris: Secondary | ICD-10-CM

## 2019-09-24 DIAGNOSIS — E7800 Pure hypercholesterolemia, unspecified: Secondary | ICD-10-CM

## 2019-09-24 DIAGNOSIS — M150 Primary generalized (osteo)arthritis: Secondary | ICD-10-CM

## 2019-09-24 DIAGNOSIS — M159 Polyosteoarthritis, unspecified: Secondary | ICD-10-CM

## 2019-09-24 MED ORDER — TOBRADEX 0.3-0.1 % OP OINT: 1.0000 "application " | TOPICAL_OINTMENT | Freq: Three times a day (TID) | OPHTHALMIC | 0 refills | Status: DC

## 2019-09-24 NOTE — Progress Notes (Signed)
Careteam: Patient Care Team: Lauree Chandler, NP as PCP - General (Geriatric Medicine) Buford Dresser, MD as PCP - Cardiology (Cardiology)  PLACE OF SERVICE:  [place]  Advanced Directive information    Allergies  Allergen Reactions  . Adhesive [Tape]     Skin irritation   . Grass Extracts [Gramineae Pollens]      Per records from from [facility]  . Lactose Intolerance (Gi) Other (See Comments)    Unknown  . Naprosyn [Naproxen] Hives  . Coq10 [Coenzyme Q10] Other (See Comments)    Cramps   . Latex Rash  . Zocor [Simvastatin] Other (See Comments)    cramps    Chief Complaint  Patient presents with  . Medical Management of Chronic Issues    6 month follow-up and fasting if labs needed   . Eye Problem    Cut on right eye, would like exam   . Foot Problem    Right foot pain  . Immunizations    High dose Flu vaccine today   . Fall Risk    Moderate fall risk      HPI: Patient is a 74 y.o. female for routine follow up  For a week she has noted increase in crust to her left eye. Then noticed a small cut on the side of her eye where the lids meet.  Drainage occurs overnight and when she cleans her eye in the morning it does not reoccur during the day. Been going on  For a week. Not wearing make up but does wear contacts.   Osteopenia- dexa up to date, continues on cal and vit D   Vit d def- continues on supplement  Hyperlipidemia- continues on crestor 4 times weekly, LDL 111 1 year ago, needs follow up labs today  A fib- rate controlled continues on metoprolol and xarelto  Had some right foot pain, could not put pressure on foot, took the walker out to help her walk but then went away. Feels like it was worse after the shoes she wore yesterday, changed shoes and now better.   Feel over the edge of latter, was wearing slippers- went to urgent care, did imaging. Got a brace and wrist has now improved.   Review of Systems: Review of  Systems  Constitutional: Negative for chills, fever and weight loss.  HENT: Negative for ear discharge, hearing loss and tinnitus.   Eyes: Positive for discharge. Negative for blurred vision, double vision and pain.  Respiratory: Negative for cough, sputum production and shortness of breath.   Cardiovascular: Negative for chest pain, palpitations and leg swelling.  Gastrointestinal: Negative for abdominal pain, constipation, diarrhea and heartburn.  Genitourinary: Negative for dysuria, frequency and urgency.  Musculoskeletal: Positive for joint pain. Negative for back pain, falls and myalgias.  Skin: Negative.   Neurological: Negative for dizziness and headaches.  Psychiatric/Behavioral: Negative for depression and memory loss. The patient does not have insomnia.     Past Medical History:  Diagnosis Date  . Achilles tendinitis    Per [facility] New Patient packet   . Atrial fibrillation [facility])    Per records from [facility] in [place], [place]   . Baker's cyst of knee, left    with resulting lower extremity edema  . Benign cyst of breast, left    Per records from [facility] in [place], [place]. Has yearly U/S in the past   . Cerebrovascular disease    Per [facility] New Patient packet   .  Chronic atrial fibrillation [facility])    Per [facility] New Patient packet   . Chronic atrial fibrillation (HCC)    [name], MD  . Decreased hearing, bilateral    referred to audiologist, Per records from [facility] in [place], [place]   . Disorder of bone density and structure, unspecified    Per records from [facility] in [place], [place]   . Disorder of carotid artery (HCC)    [name], MD  . Dyslipidemia    [name], MD  . Elevated alkaline phosphatase level    Neg ative ANA 08/2013. Per records from [facility] in [place], [place]   . H/O calculus of kidney during pregnancy    [name], MD  . H/O hysterectomy for benign disease    [name], MD  . History of fibula fracture    Right, Per records from [facility] in [place], [place]   . History of torn meniscus of right knee    Per records from [facility] in [place], [place]   . Humerus fracture    left leg- due to fall  . Humerus fracture    Resulting from a fall, Per records from [facility] in [place], [place]   . Hypercalcemia    Per records from [facility] in [place], [place]   . Hypertension    Per [facility] New Patient packet   . Kidney stone on left side    Per records from [facility] in [place], [place]   . Lactose intolerance    Per [place] Patient packet   . Multiple actinic keratoses    [name], MD  . Myalgia due to statin    Per records from [facility] in [place], [place]. Due to crestor   . Non-toxic multinodular goiter 01/21/2017   [name], MD  . Normocytic anemia    [name], MD  . Obstructive sleep apnea syndrome    Per records from [facility] in [place], [place]. Patient did not tolerate CPAP machine   . Onychomycosis    Per records from [facility] in [place], [place]   . Osteoarthritis    Per [facility] New Patient packet   . Osteopenia    Per records from [facility] in [place], [place]. Treated in the past with antiresorptive therapy  . Renal cyst 10/11/2012   Simple on CT Scan 10/2012. Per records from [facility] in [place], [place]   . 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 Umbilical hernia    Per [facility] New Patient packet   . Vitamin D deficiency    [name], MD   Past Surgical History:  Procedure Laterality Date  . ABDOMINAL HYSTERECTOMY  1989   One ovary, Per [facility] New Patient packet   . ABLATION  1996   Radical, Per [facility] Patient packet   . BREAST CYST ASPIRATION Bilateral    numerous, over several years per pt, always benign  . COLONOSCOPY  2019   Dr.Melissa Jeneen Rinks, Per [facility] New Patient  packet   . KNEE ARTHROSCOPY W/ MENISCAL REPAIR  2002   Per [facility] Patient packet   . REPLACEMENT TOTAL KNEE Left 2015   Per [place] [facility] Patient packet  . RIGHT/LEFT HEART CATH AND CORONARY ANGIOGRAPHY N/A 02/07/2018   Procedure: RIGHT/LEFT HEART CATH AND CORONARY ANGIOGRAPHY;  Surgeon: Martinique, Peter M, MD;  Location: [facility];  Service: Cardiovascular;  Laterality: N/A;  . TONSILLECTOMY  1951   Per [facility] New Patient packet   . TOOTH EXTRACTION  01/12/2019  . TOOTH EXTRACTION  06/05/2019   Preparing for dental implant    Social History:   reports that she has never smoked. She has never used smokeless tobacco. She reports current alcohol use. She reports that she does not use drugs.  Family History  Problem Relation Age of Onset  . Congestive Heart Failure Mother   . Diabetes Mother 3  . Arthritis Mother 23  . Stroke Father   . Dementia Father   . Diabetes Father   . Hyperlipidemia Father   . Alzheimer's disease Father 97  . Diabetes Sister   . Cancer Maternal Grandfather   . Diabetes Maternal Grandfather 60  . CVA Maternal Grandfather   . AAA (abdominal aortic aneurysm) Sister   . Congenital heart disease Daughter   . Heart attack Maternal Aunt   . Lung cancer Maternal Grandmother 80  . Breast cancer Paternal Aunt     Medications: Patient's Medications  New Prescriptions   No medications on file  Previous Medications   ACETAMINOPHEN (TYLENOL) 650 MG CR TABLET    Take 650 mg by mouth in the morning, at noon, and at bedtime.    CALCIUM CARB-CHOLECALCIFEROL (CALCIUM 600/VITAMIN D3 PO)    Take 1 tablet by mouth 2 (two) times daily.   CHOLECALCIFEROL (VITAMIN D3) 25 MCG (1000 UT) CAPS    Take 1,000 Units by mouth daily.    ENTRESTO 49-51 MG    TAKE 1 TABLET BY MOUTH 2 TIMES DAILY   METOPROLOL SUCCINATE (TOPROL-XL) 50 MG 24 HR TABLET    TAKE 1 TABLET BY MOUTH 2 TIMES DAILY. TAKE WITH OR immediately following A MEAL   MULTIPLE VITAMINS-MINERALS (OCUVITE PO)    Take 1  tablet by mouth daily.    POLYCARBOPHIL (FIBERCON) 625 MG TABLET    Take 1,250 mg by mouth daily.   PROBIOTIC PRODUCT (PHILLIPS COLON HEALTH PO)    Take 1 capsule by mouth daily.    ROSUVASTATIN (CRESTOR) 5 MG TABLET    Take 1 tablet (5 mg total) by mouth 4 (four) times a week.   XARELTO 20 MG TABS TABLET    TAKE 1 TABLET BY MOUTH EVERY DAY WITH SUPPER  Modified Medications   No medications on file  Discontinued Medications   AMOXICILLIN (AMOXIL) 500 MG CAPSULE    Take 500 mg by mouth 3 (three) times daily.   FLUTICASONE (FLONASE) 50 MCG/ACT NASAL SPRAY    Place 1 spray into both nostrils daily.   TRAMADOL (ULTRAM) 50 MG TABLET    Take 1 tablet (50 mg total) by mouth every 6 (six) hours as needed.    Physical Exam:  Vitals:   09/24/19 0922  BP: 122/78  Pulse: 87  Temp: (!) 96.6 F (35.9 C)  TempSrc: Temporal  SpO2: 98%  Weight: 216 lb (98 kg)  Height: '5\' 1"'  (1.549 m)   Body mass index is 40.81 kg/m. Wt Readings from Last 3 Encounters:  09/24/19 216 lb (98 kg)  05/03/19 215 lb (97.5 kg)  03/19/19 213 lb (96.6 kg)    Physical Exam Constitutional:      General: She is not in acute distress.    Appearance: She is well-developed. She is not diaphoretic.  HENT:     Head: Normocephalic and atraumatic.     Mouth/Throat:     Pharynx: No oropharyngeal exudate.  Eyes:  General:        Right eye: Discharge present.     Conjunctiva/sclera:     Right eye: Right conjunctiva is injected.     Pupils: Pupils are equal, round, and reactive to light.     Comments: Small opening between upper and lower lid  Cardiovascular:     Rate and Rhythm: Normal rate and regular rhythm.     Heart sounds: Normal heart sounds.  Pulmonary:     Effort: Pulmonary effort is normal.     Breath sounds: Normal breath sounds.  Abdominal:     General: Bowel sounds are normal.     Palpations: Abdomen is soft.  Musculoskeletal:        General: No tenderness.     Cervical back: Normal range of motion  and neck supple.  Skin:    General: Skin is warm and dry.  Neurological:     Mental Status: She is alert and oriented to person, place, and time.    Labs reviewed: Basic Metabolic Panel: Recent Labs    03/19/19 1528  NA 140  K 4.3  CL 102  CO2 28  GLUCOSE 89  BUN 19  CREATININE 1.00*  CALCIUM 9.6   Liver Function Tests: Recent Labs    03/19/19 1528  AST 15  ALT 8  BILITOT 0.5  PROT 6.8   No results for input(s): LIPASE, AMYLASE in the last 8760 hours. No results for input(s): AMMONIA in the last 8760 hours. CBC: Recent Labs    03/19/19 1528  WBC 5.5  NEUTROABS 3,905  HGB 13.9  HCT 41.7  MCV 97.4  PLT 200   Lipid Panel: No results for input(s): CHOL, HDL, LDLCALC, TRIG, CHOLHDL, LDLDIRECT in the last 8760 hours. TSH: No results for input(s): TSH in the last 8760 hours. A1C: Lab Results  Component Value Date   HGBA1C 5.5 01/28/2017     Assessment/Plan 1. Need for influenza vaccination - Flu Vaccine QUAD High Dose(Fluad)  2. Infection of right eye - to wash lids with baby shampoo TID and then follow up with antibiotic ointment TID x 1 week. To notify if symptoms worsen or fail to improve.  - tobramycin-dexamethasone (TOBRADEX) ophthalmic ointment; Place 1 application into the right eye 3 (three) times daily.  Dispense: 3.5 g; Refill: 0  3. Pure hypercholesterolemia -continues on crestor 4 times weekly without side effects. Encouraged dietary modifications and increase in physical activity as tolerates.  - Lipid Panel - CMP with eGFR([facility])  4. Primary osteoarthritis involving multiple joints Stable at this time, encouraged proper foot wear to avoid increase in pain in ankle and foot.  5. Nonischemic cardiomyopathy (HCC) Stable, continues to follow up with cardiology. Continues on enstresto 49-51 BID.   6. Chronic atrial fibrillation (HCC) Rate controlled on metoprolol succinate,  continues on xarelto for anticoagulation. No abnormal bruising or  bleeding.   - CBC with Differential/Platelet  7. Essential hypertension -stable on current regimen with dietary modifications.   Next appt: 6 months.  Carlos American. [place], [facility] [phone]

## 2019-09-24 NOTE — Patient Instructions (Signed)
  To clean eye with baby shampoo TID and then to use to use eye ointment three times daily  To notify if symptoms worsen or fail to improve

## 2019-09-25 LAB — COMPLETE METABOLIC PANEL WITH GFR
AG Ratio: 1.6 (calc) (ref 1.0–2.5)
ALT: 8 U/L (ref 6–29)
AST: 17 U/L (ref 10–35)
Albumin: 4.1 g/dL (ref 3.6–5.1)
Alkaline phosphatase (APISO): 62 U/L (ref 37–153)
BUN/Creatinine Ratio: 17 (calc) (ref 6–22)
BUN: 16 mg/dL (ref 7–25)
CO2: 32 mmol/L (ref 20–32)
Calcium: 9.5 mg/dL (ref 8.6–10.4)
Chloride: 104 mmol/L (ref 98–110)
Creat: 0.96 mg/dL — ABNORMAL HIGH (ref 0.60–0.93)
GFR, Est African American: 68 mL/min/{1.73_m2} (ref >=60)
GFR, Est Non African American: 59 mL/min/{1.73_m2} — ABNORMAL LOW (ref >=60)
Globulin: 2.6 g/dL (calc) (ref 1.9–3.7)
Glucose, Bld: 94 mg/dL (ref 65–99)
Potassium: 4.6 mmol/L (ref 3.5–5.3)
Sodium: 140 mmol/L (ref 135–146)
Total Bilirubin: 0.5 mg/dL (ref 0.2–1.2)
Total Protein: 6.7 g/dL (ref 6.1–8.1)

## 2019-09-25 LAB — CBC WITH DIFFERENTIAL/PLATELET
Absolute Monocytes: 322 cells/uL (ref 200–950)
Basophils Absolute: 32 cells/uL (ref 0–200)
Basophils Relative: 0.9 %
Eosinophils Absolute: 32 cells/uL (ref 15–500)
Eosinophils Relative: 0.9 %
HCT: 40.6 % (ref 35.0–45.0)
Hemoglobin: 13.2 g/dL (ref 11.7–15.5)
Lymphs Abs: 1215 cells/uL (ref 850–3900)
MCH: 32.5 pg (ref 27.0–33.0)
MCHC: 32.5 g/dL (ref 32.0–36.0)
MCV: 100 fL (ref 80.0–100.0)
MPV: 11.9 fL (ref 7.5–12.5)
Monocytes Relative: 9.2 %
Neutro Abs: 1901 cells/uL (ref 1500–7800)
Neutrophils Relative %: 54.3 %
Platelets: 181 10*3/uL (ref 140–400)
RBC: 4.06 10*6/uL (ref 3.80–5.10)
RDW: 12.4 % (ref 11.0–15.0)
Total Lymphocyte: 34.7 %
WBC: 3.5 10*3/uL — ABNORMAL LOW (ref 3.8–10.8)

## 2019-09-25 LAB — LIPID PANEL
Cholesterol: 225 mg/dL — ABNORMAL HIGH (ref <200)
HDL: 105 mg/dL (ref >=50)
LDL Cholesterol (Calc): 102 mg/dL (calc) — ABNORMAL HIGH
Non-HDL Cholesterol (Calc): 120 mg/dL (calc) (ref <130)
Total CHOL/HDL Ratio: 2.1 (calc) (ref <5.0)
Triglycerides: 86 mg/dL (ref <150)

## 2019-10-18 ENCOUNTER — Other Ambulatory Visit: Payer: Self-pay

## 2019-10-18 ENCOUNTER — Encounter: Payer: Self-pay | Admitting: Nurse Practitioner

## 2019-10-18 ENCOUNTER — Telehealth: Payer: Self-pay

## 2019-10-18 ENCOUNTER — Telehealth (INDEPENDENT_AMBULATORY_CARE_PROVIDER_SITE_OTHER): Payer: Medicare Other | Admitting: Nurse Practitioner

## 2019-10-18 DIAGNOSIS — J069 Acute upper respiratory infection, unspecified: Secondary | ICD-10-CM | POA: Diagnosis not present

## 2019-10-18 DIAGNOSIS — I2510 Atherosclerotic heart disease of native coronary artery without angina pectoris: Secondary | ICD-10-CM | POA: Diagnosis not present

## 2019-10-18 DIAGNOSIS — I2583 Coronary atherosclerosis due to lipid rich plaque: Secondary | ICD-10-CM

## 2019-10-18 NOTE — Progress Notes (Signed)
This service is provided via telemedicine  No vital signs collected/recorded due to the encounter was a telemedicine visit.   Location of patient (ex: home, work):  Home  Patient consents to a telephone visit: Yes, see encounter dated 10/18/2019   Location of the provider (ex: office, home):  [facility]  Name of any referring provider:  N/A  Names of all persons participating in the telemedicine service and their role in the encounter:  Abbey Chatters, Nurse Practitioner, Elveria Royals, CMA, and patient.   Time spent on call:  7 minutes with medical assistant      Careteam: Patient Care Team: [name], NP as PCP - General (Geriatric Medicine) [name], MD as PCP - Cardiology (Cardiology)  Advanced Directive information    Allergies  Allergen Reactions  . Latex Rash and Other (See Comments)    welps  . Adhesive [Tape]     Skin irritation   . Grass Extracts [Gramineae Pollens]      Per records from from [employer] [facility]  . Lactose Intolerance (Gi) Other (See Comments)    Unknown  . Naprosyn [Naproxen] Hives  . Coq10 [Coenzyme Q10] Other (See Comments)    Cramps   . Zocor [Simvastatin] Other (See Comments)    cramps    Chief Complaint  Patient presents with  . Acute Visit    Patient has sore throat. Lastnight went to [place] [place] and was out of breath more than usual. No fever or chills. Patient has not had cough. Patient in close contac with someone that had COVID the beginning of September.     HPI: Patient is a 74 y.o. female via video visit. She has had sore throat for 3 days.  Felt like she was getting out of breath more easily yesterday but not feeling like this today. She was climbing steps and walking briskly when she became out of breath- generally does not go up or down steps. No fever.  No cough.  Runny nose.  Started taking Claritin after her last OV had helped but now worse.  Taking  zicam which has been helping   Reports last week he had a visitor that had been diagnosised with COVID a month prior, he had not had any COVID symptoms for 2 weeks but now pt and husband have cold symptoms.   Review of Systems:  Review of Systems  Constitutional: Negative for chills, fever and weight loss.  HENT: Positive for sore throat. Negative for congestion and tinnitus.        Runny nose  Respiratory: Negative for cough, sputum production and shortness of breath.   Cardiovascular: Negative for chest pain, palpitations and leg swelling.  Gastrointestinal: Negative for abdominal pain, constipation, diarrhea and heartburn.  Genitourinary: Negative for dysuria, frequency and urgency.  Musculoskeletal: Positive for joint pain and myalgias.  Skin: Negative.   Neurological: Negative for dizziness and headaches.  Psychiatric/Behavioral: Negative for depression and memory loss. The patient does not have insomnia.     Past Medical History:  Diagnosis Date  . Achilles tendinitis    Per [facility] New Patient packet   . Atrial fibrillation [facility])    Per records from [facility] in [place], [place]   . Baker's cyst of knee, left    with resulting lower extremity edema  . Benign cyst of breast, left    Per records from [facility] in [place], [place]. Has yearly U/S in the past   .  Cerebrovascular disease    Per [facility] New Patient packet   . Chronic atrial fibrillation [facility])    Per [facility] New Patient packet   . Chronic atrial fibrillation (HCC)    [name], MD  . Decreased hearing, bilateral    referred to audiologist, Per records from [facility] in [place], [place]   . Disorder of bone density and structure, unspecified    Per records from [facility] in [place], [place]   . Disorder of carotid artery (HCC)    Lynden Ang, MD  . Dyslipidemia    [name], MD  . Elevated alkaline phosphatase level    Neg ative ANA 08/2013. Per records from [facility] in [place], [place]   . H/O calculus of kidney during pregnancy    Lynden Ang, MD  . H/O hysterectomy for benign disease    Lynden Ang, MD  . History of fibula fracture    Right, Per records from [facility] in [place], [place]   . History of torn meniscus of right knee    Per records from [facility] in [place], [place]   . Humerus fracture    left leg- due to fall  . Humerus fracture    Resulting from a fall, Per records from [facility] in [place], [place]   . Hypercalcemia    Per records from [facility] in [place], [place]   . Hypertension    Per [facility] New Patient packet   . Kidney stone on left side    Per records from [facility] in [place], [place]   . Lactose intolerance    Per [facility] New Patient packet   . Multiple actinic keratoses    Lynden Ang, MD  . Myalgia due to statin    Per records from [facility] in [place], [place]. Due to crestor   . 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 Normocytic anemia    Lynden Ang, MD  . Obstructive sleep apnea syndrome    Per records from [facility] in [place], [place]. Patient did not tolerate CPAP machine   . Onychomycosis    Per records from [facility] in [place], [place]   . Osteoarthritis    Per [facility] New Patient packet   . Osteopenia    Per records from [facility] in [place], [place]. Treated in the past with antiresorptive therapy  . Renal cyst 10/11/2012   Simple on CT Scan 10/2012. Per records from [facility] in [place], [place]   . 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 Umbilical hernia    Per [facility] New Patient packet   . Vitamin D deficiency    Lynden Ang, MD   Past Surgical History:  Procedure Laterality Date  . ABDOMINAL HYSTERECTOMY  1989   One ovary, Per [facility] New Patient packet   . ABLATION  1996   Radical, Per [facility] New Patient packet   .  BREAST CYST ASPIRATION Bilateral    numerous, over several years per pt, always benign  . COLONOSCOPY  2019   Dr.Melissa Fayrene Fearing, Per [facility] New Patient packet   . KNEE ARTHROSCOPY W/ MENISCAL REPAIR  2002   Per [facility] New Patient packet   . REPLACEMENT TOTAL KNEE Left 2015   Per [facility] New Patient packet  . RIGHT/LEFT HEART CATH AND CORONARY ANGIOGRAPHY N/A 02/07/2018   Procedure: RIGHT/LEFT HEART CATH AND CORONARY  ANGIOGRAPHY;  Surgeon: Swaziland, Peter M, MD;  Location: [facility] INVASIVE CV LAB;  Service: Cardiovascular;  Laterality: N/A;  . TONSILLECTOMY  1951   Per [facility] New Patient packet   . TOOTH EXTRACTION  01/12/2019  . TOOTH EXTRACTION  06/05/2019   Preparing for dental implant    Social History:   reports that she has never smoked. She has never used smokeless tobacco. She reports current alcohol use. She reports that she does not use drugs.  Family History  Problem Relation Age of Onset  . Congestive Heart Failure Mother   . Diabetes Mother 52  . Arthritis Mother 63  . Stroke Father   . Dementia Father   . Diabetes Father   . Hyperlipidemia Father   . Alzheimer's disease Father 45  . Diabetes Sister   . Cancer Maternal Grandfather   . Diabetes Maternal Grandfather 60  . CVA Maternal Grandfather   . AAA (abdominal aortic aneurysm) Sister   . Congenital heart disease Daughter   . Heart attack Maternal Aunt   . Lung cancer Maternal Grandmother 80  . Breast cancer Paternal Aunt     Medications: Patient's Medications  New Prescriptions   No medications on file  Previous Medications   ACETAMINOPHEN (TYLENOL) 650 MG CR TABLET    Take 650 mg by mouth in the morning, at noon, and at bedtime.    CALCIUM CARB-CHOLECALCIFEROL (CALCIUM 600/VITAMIN D3 PO)    Take 1 tablet by mouth 2 (two) times daily.   CHOLECALCIFEROL (VITAMIN D3) 25 MCG (1000 UT) CAPS    Take 1,000 Units by mouth daily.    ENTRESTO 49-51 MG    TAKE 1 TABLET BY MOUTH 2 TIMES DAILY   METOPROLOL SUCCINATE (TOPROL-XL) 50 MG 24  HR TABLET    TAKE 1 TABLET BY MOUTH 2 TIMES DAILY. TAKE WITH OR immediately following A MEAL   MULTIPLE VITAMINS-MINERALS (OCUVITE PO)    Take 1 tablet by mouth daily.    POLYCARBOPHIL (FIBERCON) 625 MG TABLET    Take 1,250 mg by mouth daily.   PROBIOTIC PRODUCT (PHILLIPS COLON HEALTH PO)    Take 1 capsule by mouth daily.    ROSUVASTATIN (CRESTOR) 5 MG TABLET    Take 1 tablet (5 mg total) by mouth 4 (four) times a week.   XARELTO 20 MG TABS TABLET    TAKE 1 TABLET BY MOUTH EVERY DAY WITH SUPPER  Modified Medications   No medications on file  Discontinued Medications   TOBRAMYCIN-DEXAMETHASONE (TOBRADEX) OPHTHALMIC OINTMENT    Place 1 application into the right eye 3 (three) times daily.    Physical Exam:  There were no vitals filed for this visit. There is no height or weight on file to calculate BMI. Wt Readings from Last 3 Encounters:  09/24/19 216 lb (98 kg)  05/03/19 215 lb (97.5 kg)  03/19/19 213 lb (96.6 kg)      Labs reviewed: Basic Metabolic Panel: Recent Labs    03/19/19 1528 09/24/19 1008  NA 140 140  K 4.3 4.6  CL 102 104  CO2 28 32  GLUCOSE 89 94  BUN 19 16  CREATININE 1.00* 0.96*  CALCIUM 9.6 9.5   Liver Function Tests: Recent Labs    03/19/19 1528 09/24/19 1008  AST 15 17  ALT 8 8  BILITOT 0.5 0.5  PROT 6.8 6.7   No results for input(s): LIPASE, AMYLASE in the last 8760 hours. No results for input(s): AMMONIA in the last 8760 hours. CBC: Recent  Labs    03/19/19 1528 09/24/19 1008  WBC 5.5 3.5*  NEUTROABS 3,905 1,901  HGB 13.9 13.2  HCT 41.7 40.6  MCV 97.4 100.0  PLT 200 181   Lipid Panel: Recent Labs    09/24/19 1008  CHOL 225*  HDL 105  LDLCALC 102*  TRIG 86  CHOLHDL 2.1   TSH: No results for input(s): TSH in the last 8760 hours. A1C: Lab Results  Component Value Date   HGBA1C 5.5 01/28/2017     Assessment/Plan 1. Viral upper respiratory tract infection -overall feeling well with mild symptoms, could be breakthrough  COVID, unlikely from recent visitor since he was not symptomatic and had COVID a month ago.  --educated to go online to schedule appt for COVID testing through [email or web address] -make sure you are staying well hydrated -recommended to take Vit C 1000 mg twice daily, Vit D 5000 units, zinc 50 mg daily for 14 days -mucinex DM twice daily if needed for chest congestion -education provided on when to follow up and when to seek immediate medication attention through the ED.  -can use tylenol 1000 mg every 8 hours as needed for sore throat -max of 3000 mg of tylenol from all sources  Next appt: 03/24/2020 Shanda Bumps K. Biagio Borg  [facility] [phone]    Virtual Visit via Earleen Reaper  I connected with patient on 10/18/19 at  2:15 PM EDT by video and verified that I am speaking with the correct person using two identifiers.  Location: Patient: home Provider: [facility]   I discussed the limitations, risks, security and privacy concerns of performing an evaluation and management service by telephone and the availability of in person appointments. I also discussed with the patient that there may be a patient responsible charge related to this service. The patient expressed understanding and agreed to proceed.   I discussed the assessment and treatment plan with the patient. The patient was provided an opportunity to ask questions and all were answered. The patient agreed with the plan and demonstrated an understanding of the instructions.   The patient was advised to call back or seek an in-person evaluation if the symptoms worsen or if the condition fails to improve as anticipated.  I provided 15 minutes of non-face-to-face time during this encounter.  Janene Harvey. Biagio Borg Avs printed and mailed

## 2019-10-18 NOTE — Telephone Encounter (Signed)
Ms. shadavia, dampier are scheduled for a virtual visit with your provider today.    Just as we do with appointments in the office, we must obtain your consent to participate.  Your consent will be active for this visit and any virtual visit you may have with one of our providers in the next 365 days.    If you have a MyChart account, I can also send a copy of this consent to you electronically.  All virtual visits are billed to your insurance company just like a traditional visit in the office.  As this is a virtual visit, video technology does not allow for your provider to perform a traditional examination.  This may limit your provider's ability to fully assess your condition.  If your provider identifies any concerns that need to be evaluated in person or the need to arrange testing such as labs, EKG, etc, we will make arrangements to do so.    Although advances in technology are sophisticated, we cannot ensure that it will always work on either your end or our end.  If the connection with a video visit is poor, we may have to switch to a telephone visit.  With either a video or telephone visit, we are not always able to ensure that we have a secure connection.   I need to obtain your verbal consent now.   Are you willing to proceed with your visit today?   Hannalee Castor has provided verbal consent on 10/18/2019 for a virtual visit (video or telephone).   Elveria Royals, CMA 10/18/2019  1:51 PM

## 2019-10-26 ENCOUNTER — Other Ambulatory Visit: Payer: Self-pay

## 2019-10-26 ENCOUNTER — Ambulatory Visit
Admission: RE | Admit: 2019-10-26 | Discharge: 2019-10-26 | Disposition: A | Discharge status: Home / Self Care | Payer: Medicare Other | Source: Ambulatory Visit | Attending: Family | Admitting: Family

## 2019-10-26 ENCOUNTER — Other Ambulatory Visit: Payer: Self-pay | Admitting: Family

## 2019-10-26 ENCOUNTER — Encounter: Payer: Self-pay | Admitting: Family

## 2019-10-26 ENCOUNTER — Ambulatory Visit (INDEPENDENT_AMBULATORY_CARE_PROVIDER_SITE_OTHER): Payer: Medicare Other | Admitting: Family

## 2019-10-26 VITALS — BP 140/80 | HR 75 | Temp 97.5°F | Resp 20 | Ht 61.0 in

## 2019-10-26 DIAGNOSIS — J069 Acute upper respiratory infection, unspecified: Secondary | ICD-10-CM

## 2019-10-26 DIAGNOSIS — J209 Acute bronchitis, unspecified: Secondary | ICD-10-CM

## 2019-10-26 DIAGNOSIS — I2583 Coronary atherosclerosis due to lipid rich plaque: Secondary | ICD-10-CM | POA: Diagnosis not present

## 2019-10-26 DIAGNOSIS — I2510 Atherosclerotic heart disease of native coronary artery without angina pectoris: Secondary | ICD-10-CM | POA: Diagnosis not present

## 2019-10-26 LAB — POCT INFLUENZA A/B: Influenza A, POC: NEGATIVE

## 2019-10-26 LAB — INFLUENZA INJ QUAD- HIGH DOSE: Influenza A+B Virus Ag-Direct(Rapid): NEGATIVE

## 2019-10-26 MED ORDER — AZITHROMYCIN 250 MG PO TABS: ORAL_TABLET | ORAL | 0 refills | Status: DC

## 2019-10-26 NOTE — Patient Instructions (Signed)
-   please get Chest X-ray done at [facility] imaging [address] [employer] Will call you with results.

## 2019-10-26 NOTE — Progress Notes (Signed)
Provider: Nasean Zapf FNP-C  [name], NP  Patient Care Team: [name], NP as PCP - General (Geriatric Medicine) [name], MD as PCP - Cardiology (Cardiology)  Extended Emergency Contact Information Primary Emergency Contact: Bibb,stephen Mobile Phone: [phone] Relation: Spouse Interpreter needed? No  Code Status:  DNR Goals of care: Advanced Directive information Advanced Directives 10/26/2019  Does Patient Have a Medical Advance Directive? Yes  Type of Estate agent of [place];Living will  Does patient want to make changes to medical advance directive? No - Patient declined  Copy of Healthcare Power of Attorney in Chart? Yes - validated most recent copy scanned in chart (See row information)     Chief Complaint  Patient presents with  . Acute Visit    HPI:  Pt is a 74 y.o. female seen today for an acute visit for evaluation of  10/5 /2021 started with a tingle cough then got worst couple days later.Has had lots of phlegm but eased up yesterday.but still coughed at night had to sit on recliner.gets headache from coughing.chest feels tight. No loss of smell or taste.the day prior to symptoms she had gone to see a play [facility] [employer] [facility] then went out to eat at a Restaurant. States a family member whom they were together also has similar symptoms.    Past Medical History:  Diagnosis Date  . Achilles tendinitis    Per [facility] New Patient packet   . Atrial fibrillation [facility])    Per records from [facility] in [place], [place]   . Baker's cyst of knee, left    with resulting lower extremity edema  . Benign cyst of breast, left    Per records from [facility] in [place], [place]. Has yearly U/S in the past   . Cerebrovascular disease    Per [facility] New Patient packet   . Chronic atrial fibrillation [facility])    Per [facility] New Patient packet   . Chronic atrial fibrillation (HCC)    [name], MD  .  Decreased hearing, bilateral    referred to audiologist, Per records from [facility] in [place], [place]   . Disorder of bone density and structure, unspecified    Per records from [facility] in [place], [place]   . Disorder of carotid artery (HCC)    Lynden Ang, MD  . Dyslipidemia    [name], MD  . Elevated alkaline phosphatase level    Neg ative ANA 08/2013. Per records from [facility] in [place], [place]   . H/O calculus of kidney during pregnancy    Lynden Ang, MD  . H/O hysterectomy for benign disease    Lynden Ang, MD  . History of fibula fracture    Right, Per records from [facility] in [place], [place]   . History of torn meniscus of right knee    Per records from [facility] in [place], [place]   . Humerus fracture    left leg- due to fall  . Humerus fracture    Resulting from a fall, Per records from [facility] in [place], [place]   . Hypercalcemia    Per records from [facility] in [place], [place]   . Hypertension    Per [facility] New Patient packet   . Kidney stone on left side    Per records from [facility] in [place], [place]   . Lactose intolerance    Per [facility] New Patient packet   .  Multiple actinic keratoses    Lynden Ang, MD  . Myalgia due to statin    Per records from [facility] in [place], [place]. Due to crestor   . 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 Normocytic anemia    Lynden Ang, MD  . Obstructive sleep apnea syndrome    Per records from [facility] in [place], [place]. Patient did not tolerate CPAP machine   . Onychomycosis    Per records from [facility] in [place], [place]   . Osteoarthritis    Per [facility] New Patient packet   . Osteopenia    Per records from [facility] in [place], [place]. Treated in the past with antiresorptive therapy  . Renal cyst 10/11/2012   Simple on CT Scan 10/2012. Per records  from [facility] in [place], [place]   . 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 Umbilical hernia    Per [facility] New Patient packet   . Vitamin D deficiency    Lynden Ang, MD   Past Surgical History:  Procedure Laterality Date  . ABDOMINAL HYSTERECTOMY  1989   One ovary, Per [facility] New Patient packet   . ABLATION  1996   Radical, Per [facility] New Patient packet   . BREAST CYST ASPIRATION Bilateral    numerous, over several years per pt, always benign  . COLONOSCOPY  2019   Dr.Melissa Fayrene Fearing, Per [facility] New Patient packet   . KNEE ARTHROSCOPY W/ MENISCAL REPAIR  2002   Per [facility] New Patient packet   . REPLACEMENT TOTAL KNEE Left 2015   Per [facility] New Patient packet  . RIGHT/LEFT HEART CATH AND CORONARY ANGIOGRAPHY N/A 02/07/2018   Procedure: RIGHT/LEFT HEART CATH AND CORONARY ANGIOGRAPHY;  Surgeon: Swaziland, Peter M, MD;  Location: [facility] INVASIVE CV LAB;  Service: Cardiovascular;  Laterality: N/A;  . TONSILLECTOMY  1951   Per [facility] New Patient packet   . TOOTH EXTRACTION  01/12/2019  . TOOTH EXTRACTION  06/05/2019   Preparing for dental implant     Allergies  Allergen Reactions  . Latex Rash and Other (See Comments)    welps  . Adhesive [Tape]     Skin irritation   . Grass Extracts [Gramineae Pollens]      Per records from from [facility]  . Lactose Intolerance (Gi) Other (See Comments)    Unknown  . Naprosyn [Naproxen] Hives  . Coq10 [Coenzyme Q10] Other (See Comments)    Cramps   . Zocor [Simvastatin] Other (See Comments)    cramps    Outpatient Encounter Medications as of 10/26/2019  Medication Sig  . acetaminophen (TYLENOL) 650 MG CR tablet Take 650 mg by mouth in the morning, at noon, and at bedtime.   . Calcium Carb-Cholecalciferol (CALCIUM 600/VITAMIN D3 PO) Take 1 tablet by mouth 2 (two) times daily.  . Cholecalciferol (VITAMIN D3) 25 MCG (1000 UT) CAPS Take 1,000 Units by mouth daily.   Marland Kitchen Dextromethorphan-guaiFENesin  (MUCINEX DM PO) Take by mouth as needed.  Marland Kitchen ENTRESTO 49-51 MG TAKE 1 TABLET BY MOUTH 2 TIMES DAILY  . Loratadine (CLARITIN PO) Take by mouth daily.  . metoprolol succinate (TOPROL-XL) 50 MG 24 hr tablet TAKE 1 TABLET BY MOUTH 2 TIMES DAILY. TAKE WITH OR immediately following A MEAL  . Multiple Vitamins-Minerals (OCUVITE PO) Take 1 tablet by mouth daily.   . polycarbophil (FIBERCON) 625 MG tablet Take 1,250 mg by mouth  daily.  . Probiotic Product (PHILLIPS COLON HEALTH PO) Take 1 capsule by mouth daily.   . rosuvastatin (CRESTOR) 5 MG tablet Take 1 tablet (5 mg total) by mouth 4 (four) times a week.  Carlena Hurl 20 MG TABS tablet TAKE 1 TABLET BY MOUTH EVERY DAY WITH SUPPER  . Zinc 50 MG TABS Take 1 tablet by mouth daily.   No facility-administered encounter medications on file as of 10/26/2019.    Review of Systems  Constitutional: Negative for appetite change, chills, fatigue and fever.  HENT: Negative for congestion, rhinorrhea, sinus pressure, sinus pain, sneezing, sore throat and trouble swallowing.        Had sore throat for few days but has resolved.   Eyes: Negative for discharge, redness and itching.  Respiratory: Positive for cough. Negative for chest tightness, shortness of breath and wheezing.   Cardiovascular: Negative for chest pain, palpitations and leg swelling.  Gastrointestinal: Negative for abdominal distention, abdominal pain, constipation, diarrhea, nausea and vomiting.  Genitourinary: Negative for decreased urine volume, difficulty urinating, dysuria, flank pain and urgency.  Musculoskeletal: Negative for arthralgias and gait problem.  Skin: Negative for color change, pallor and rash.  Neurological: Negative for dizziness, speech difficulty, weakness, light-headedness, numbness and headaches.  Psychiatric/Behavioral: Negative for agitation, behavioral problems and sleep disturbance. The patient is not nervous/anxious.     Immunization History  Administered Date(s)  Administered  . Fluad Quad(high Dose 65+) 09/14/2018, 09/24/2019  . Influenza, High Dose Seasonal PF 10/31/2017  . Influenza, Quadrivalent, Recombinant, Inj, Pf 10/11/2016  . Influenza,inj,quad, With Preservative 10/11/2016  . PFIZER SARS-COV-2 Vaccination 01/30/2019, 02/19/2019  . Pneumococcal Conjugate-13 11/20/2013  . Pneumococcal Polysaccharide-23 11/13/2012  . Tdap 11/27/2007, 07/05/2018  . Zoster 05/08/2016  . Zoster Recombinat (Shingrix) 08/14/2016   Pertinent  Health Maintenance Due  Topic Date Due  . MAMMOGRAM  03/22/2020  . INFLUENZA VACCINE  Completed  . DEXA SCAN  Completed  . PNA vac Low Risk Adult  Completed   Fall Risk  09/24/2019 03/19/2019 03/19/2019 08/16/2018 02/15/2018  Falls in the past year? 1 0 0 0 0  Number falls in past yr: 0 0 0 0 0  Injury with Fall? 1 0 0 0 0  Comment Sprain right wrist - - - -  Risk for fall due to : Other (Comment) - - - -  Risk for fall due to: Comment wore wrong shoes - - - -    Vitals:   10/26/19 0953  Pulse: 75  Resp: 20  Temp: (!) 97.5 F (36.4 C)  TempSrc: Temporal  SpO2: 96%  Height: 5\' 1"  (1.549 m)   Body mass index is 40.81 kg/m. Physical Exam Vitals reviewed.  Constitutional:      General: She is not in acute distress.    Appearance: She is not ill-appearing.  HENT:     Head: Normocephalic.     Right Ear: Tympanic membrane, ear canal and external ear normal. There is no impacted cerumen.     Left Ear: Tympanic membrane, ear canal and external ear normal. There is no impacted cerumen.     Nose: Nose normal. No congestion or rhinorrhea.     Mouth/Throat:     Mouth: Mucous membranes are moist.     Pharynx: Oropharynx is clear. No oropharyngeal exudate or posterior oropharyngeal erythema.  Eyes:     General: No scleral icterus.       Right eye: No discharge.        Left eye: No discharge.  Conjunctiva/sclera: Conjunctivae normal.     Pupils: Pupils are equal, round, and reactive to light.  Cardiovascular:      Rate and Rhythm: Normal rate and regular rhythm.     Pulses: Normal pulses.     Heart sounds: Normal heart sounds. No murmur heard.  No friction rub. No gallop.   Pulmonary:     Effort: Pulmonary effort is normal. No respiratory distress.     Breath sounds: Examination of the left-middle field reveals rales. Examination of the left-lower field reveals rales. Rales present. No wheezing or rhonchi.  Chest:     Chest wall: No tenderness.  Musculoskeletal:        General: No swelling or tenderness. Normal range of motion.     Right lower leg: No edema.     Left lower leg: No edema.  Skin:    General: Skin is warm and dry.     Coloration: Skin is not pale.     Findings: No bruising, erythema or rash.  Neurological:     Mental Status: She is alert and oriented to person, place, and time.     Cranial Nerves: No cranial nerve deficit.     Sensory: No sensory deficit.     Motor: No weakness.     Coordination: Coordination normal.     Gait: Gait normal.  Psychiatric:        Mood and Affect: Mood normal.        Behavior: Behavior normal.        Thought Content: Thought content normal.        Judgment: Judgment normal.     Labs reviewed: Recent Labs    03/19/19 1528 09/24/19 1008  NA 140 140  K 4.3 4.6  CL 102 104  CO2 28 32  GLUCOSE 89 94  BUN 19 16  CREATININE 1.00* 0.96*  CALCIUM 9.6 9.5   Recent Labs    03/19/19 1528 09/24/19 1008  AST 15 17  ALT 8 8  BILITOT 0.5 0.5  PROT 6.8 6.7   Recent Labs    03/19/19 1528 09/24/19 1008  WBC 5.5 3.5*  NEUTROABS 3,905 1,901  HGB 13.9 13.2  HCT 41.7 40.6  MCV 97.4 100.0  PLT 200 181   Lab Results  Component Value Date   TSH 1.74 12/07/2017   Lab Results  Component Value Date   HGBA1C 5.5 01/28/2017   Lab Results  Component Value Date   CHOL 225 (H) 09/24/2019   HDL 105 09/24/2019   LDLCALC 102 (H) 09/24/2019   TRIG 86 09/24/2019   CHOLHDL 2.1 09/24/2019    Significant Diagnostic Results in last 30 days:    No results found.  Assessment/Plan 1.upper respiratory tract infection Afebrile.Ongoing for about a week without improvement.will rule out Flu and COVID-19 given recent attendance to a public play event at [facility]. - POCT Influenza A/B was negative.Results discussed with patient and Husband.  - SARS-COV-2 RNA,(COVID-19) QUAL NAAT   2. Acute bronchitis, unspecified organism On going x 1 week without improvement.left mid-lower lobe rales clears with cough.Will obtain CXR rule out Pneumonia. - continue on mucinex and encouraged to increase water intake.  - azithromycin (ZITHROMAX) 250 MG tablet; Take 2 tablets x 1 dose then one Tablet by mouth x 4 days  Dispense: 6 tablet; Refill: 0 - Notify provider or go to ED if symptoms worsen or not improved. - Advised to get CXR at [facility] on [address] over [place] then will call  with results.Address also given on AVS  Family/ staff Communication: Reviewed plan of care with patient  Labs/tests ordered: CXR 2 views   Next Appointment: As needed if symptoms worsen or fail to improve  Caesar Bookman, NP

## 2019-10-27 LAB — SARS-COV-2 RNA,(COVID-19) QUALITATIVE NAAT: SARS CoV2 RNA: NOT DETECTED

## 2019-10-29 ENCOUNTER — Ambulatory Visit
Admission: RE | Admit: 2019-10-29 | Discharge: 2019-10-29 | Disposition: A | Discharge status: Home / Self Care | Payer: Medicare Other | Source: Ambulatory Visit | Attending: Family | Admitting: Family

## 2019-10-29 ENCOUNTER — Other Ambulatory Visit: Payer: Self-pay

## 2019-10-29 DIAGNOSIS — J209 Acute bronchitis, unspecified: Secondary | ICD-10-CM

## 2019-10-29 DIAGNOSIS — I700 Atherosclerosis of aorta: Secondary | ICD-10-CM | POA: Diagnosis not present

## 2019-10-29 DIAGNOSIS — I517 Cardiomegaly: Secondary | ICD-10-CM | POA: Diagnosis not present

## 2019-10-29 DIAGNOSIS — J069 Acute upper respiratory infection, unspecified: Secondary | ICD-10-CM | POA: Diagnosis not present

## 2019-11-05 DIAGNOSIS — Z23 Encounter for immunization: Secondary | ICD-10-CM | POA: Diagnosis not present

## 2019-11-06 ENCOUNTER — Encounter: Payer: Self-pay | Admitting: Cardiology

## 2019-11-06 ENCOUNTER — Ambulatory Visit (INDEPENDENT_AMBULATORY_CARE_PROVIDER_SITE_OTHER): Payer: Medicare Other | Admitting: Cardiology

## 2019-11-06 ENCOUNTER — Other Ambulatory Visit: Payer: Self-pay

## 2019-11-06 VITALS — BP 128/74 | HR 93 | Ht 62.0 in | Wt 216.0 lb

## 2019-11-06 DIAGNOSIS — I4821 Permanent atrial fibrillation: Secondary | ICD-10-CM | POA: Diagnosis not present

## 2019-11-06 DIAGNOSIS — I2583 Coronary atherosclerosis due to lipid rich plaque: Secondary | ICD-10-CM | POA: Diagnosis not present

## 2019-11-06 DIAGNOSIS — E7800 Pure hypercholesterolemia, unspecified: Secondary | ICD-10-CM

## 2019-11-06 DIAGNOSIS — Z7189 Other specified counseling: Secondary | ICD-10-CM | POA: Diagnosis not present

## 2019-11-06 DIAGNOSIS — I10 Essential (primary) hypertension: Secondary | ICD-10-CM

## 2019-11-06 DIAGNOSIS — Z7901 Long term (current) use of anticoagulants: Secondary | ICD-10-CM

## 2019-11-06 DIAGNOSIS — I2510 Atherosclerotic heart disease of native coronary artery without angina pectoris: Secondary | ICD-10-CM

## 2019-11-06 DIAGNOSIS — I428 Other cardiomyopathies: Secondary | ICD-10-CM

## 2019-11-06 DIAGNOSIS — I482 Chronic atrial fibrillation, unspecified: Secondary | ICD-10-CM

## 2019-11-06 NOTE — Progress Notes (Signed)
Cardiology Office Note:    Date:  11/06/2019   ID:  Jennifer Mcclain, DOB 1945-10-02, MRN [id number]  PCP:  Jennifer Seller, NP  Cardiologist:  Jennifer Red, MD PhD  Referring MD: Jennifer Seller, NP   CC: follow up  History of Present Illness:    Jennifer Mcclain is a 74 y.o. female with a hx of atrial fibrillation, hyperlipidemia, hypertension who is seen for follow up for nonischemic cardiomyopathy. Initial consult with me was on 01/17/18 for atrial fibrillation.  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 Had an ablation done, but was told part of it was too close to the His bundle and so the EP cardiologist ablated all around it but not in the bundle. Moved to [place] about 10 years ago, was told there was no evidence of WPW. She had an episode of afib about 5 years ago that was fast, but has otherwise been well controlled. She can feel when she has atrial fibrillation, usually a few times/day. Doesn't stop her from doing her daily activities. Doesn't think it lasts long. No syncope. No chest pain. Does have some shortness of breath.  Daughter passed away from heart disease; had a kink in one of her arteries, died at age 69.  Since coming to [facility], had echo with abnormal EF, moderate MR, moderate-severe TR. Cath did not show significant CAD.   Today: Just recovered from bronchitis, feeling back to normal. Had her Covid booster this AM. Brings BP/HR log with her, generally at goal.   Shortness of breath with exertion unchanged.   Denies chest pain, shortness of breath at rest or with normal exertion. No PND, orthopnea, LE edema or unexpected weight gain. No syncope or palpitations.  Past Medical History:  Diagnosis Date  . Achilles tendinitis    Per [facility] New Patient packet   . Atrial fibrillation [facility])    Per records from [facility] in [place], [place]   . Baker's cyst of knee, left    with resulting lower extremity edema  . Benign  cyst of breast, left    Per records from [facility] in [place], [place]. Has yearly U/S in the past   . Cerebrovascular disease    Per [facility] New Patient packet   . Chronic atrial fibrillation [facility])    Per [facility] New Patient packet   . Chronic atrial fibrillation (HCC)    [name], MD  . Decreased hearing, bilateral    referred to audiologist, Per records from [facility] in [place], [place]   . Disorder of bone density and structure, unspecified    Per records from [facility] in [place], [place]   . Disorder of carotid artery (HCC)    Lynden Ang, MD  . Dyslipidemia    [name], MD  . Elevated alkaline phosphatase level    Neg ative ANA 08/2013. Per records from [facility] in [place], [place]   . H/O calculus of kidney during pregnancy    Lynden Ang, MD  . H/O hysterectomy for benign disease    Lynden Ang, MD  . History of fibula fracture    Right, Per records from [facility] in [place], [place]   . History of torn meniscus of right knee    Per records from [facility] in [place], [place]   . Humerus fracture    left leg- due to fall  . Humerus fracture    Resulting from a fall, Per records  from [facility] in [place], [place]   . Hypercalcemia    Per records from [facility] in [place], [place]   . Hypertension    Per [facility] New Patient packet   . Kidney stone on left side    Per records from [facility] in [place], [place]   . Lactose intolerance    Per [facility] New Patient packet   . Multiple actinic keratoses    Lynden Ang, MD  . Myalgia due to statin    Per records from [facility] in [place], [place]. Due to crestor   . 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 Normocytic anemia    Lynden Ang, MD  . Obstructive sleep apnea syndrome    Per records from [facility] in [place], [place]. Patient did not tolerate CPAP machine   . Onychomycosis     Per records from [facility] in [place], [place]   . Osteoarthritis    Per [facility] New Patient packet   . Osteopenia    Per records from [facility] in [place], [place]. Treated in the past with antiresorptive therapy  . Renal cyst 10/11/2012   Simple on CT Scan 10/2012. Per records from [facility] in [place], [place]   . 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 Umbilical hernia    Per [facility] New Patient packet   . Vitamin D deficiency    Lynden Ang, MD    Past Surgical History:  Procedure Laterality Date  . ABDOMINAL HYSTERECTOMY  1989   One ovary, Per [facility] New Patient packet   . ABLATION  1996   Radical, Per [facility] New Patient packet   . BREAST CYST ASPIRATION Bilateral    numerous, over several years per pt, always benign  . COLONOSCOPY  2019   Dr.Melissa Fayrene Fearing, Per [facility] New Patient packet   . KNEE ARTHROSCOPY W/ MENISCAL REPAIR  2002   Per [facility] New Patient packet   . REPLACEMENT TOTAL KNEE Left 2015   Per [facility] New Patient packet  . RIGHT/LEFT HEART CATH AND CORONARY ANGIOGRAPHY N/A 02/07/2018   Procedure: RIGHT/LEFT HEART CATH AND CORONARY ANGIOGRAPHY;  Surgeon: Swazil[place], Peter M, MD;  Location: [facility] INVASIVE CV LAB;  Service: Cardiovascular;  Laterality: N/A;  . TONSILLECTOMY  1951   Per [facility] New Patient packet   . TOOTH EXTRACTION  01/12/2019  . TOOTH EXTRACTION  06/05/2019   Preparing for dental implant     Current Medications: Current Outpatient Medications on File Prior to Visit  Medication Sig  . acetaminophen (TYLENOL) 650 MG CR tablet Take 650 mg by mouth in the morning, at noon, and at bedtime.   Marland Kitchen azithromycin (ZITHROMAX) 250 MG tablet Take 2 tablets x 1 dose then one Tablet by mouth x 4 days  . Calcium Carb-Cholecalciferol (CALCIUM 600/VITAMIN D3 PO) Take 1 tablet by mouth 2 (two) times daily.  . Cholecalciferol (VITAMIN D3) 25 MCG (1000 UT) CAPS Take 1,000 Units by mouth daily.   Marland Kitchen  Dextromethorphan-guaiFENesin (MUCINEX DM PO) Take by mouth as needed.  Marland Kitchen ENTRESTO 49-51 MG TAKE 1 TABLET BY MOUTH 2 TIMES DAILY  . Loratadine (CLARITIN PO) Take by mouth daily.  . metoprolol succinate (TOPROL-XL) 50 MG 24 hr tablet TAKE 1 TABLET BY MOUTH 2 TIMES DAILY. TAKE WITH OR immediately following A MEAL  . Multiple Vitamins-Minerals (OCUVITE PO) Take 1 tablet by mouth daily.   . polycarbophil (FIBERCON)  625 MG tablet Take 1,250 mg by mouth daily.  . Probiotic Product (PHILLIPS COLON HEALTH PO) Take 1 capsule by mouth daily.   . rosuvastatin (CRESTOR) 5 MG tablet Take 1 tablet (5 mg total) by mouth 4 (four) times a week.  Carlena Hurl 20 MG TABS tablet TAKE 1 TABLET BY MOUTH EVERY DAY WITH SUPPER  . Zinc 50 MG TABS Take 1 tablet by mouth daily.   No current facility-administered medications on file prior to visit.     Allergies:   Latex, Adhesive [tape], Grass extracts [gramineae pollens], Lactose intolerance (gi), Naprosyn [naproxen], Coq10 [coenzyme q10], and Zocor [simvastatin]   Social History   Tobacco Use  . Smoking status: Never Smoker  . Smokeless tobacco: Never Used  Vaping Use  . Vaping Use: Never used  Substance Use Topics  . Alcohol use: Yes    Comment: 1-2 drinks weekly - social   . Drug use: Never    Family History: The patient's family history includes AAA (abdominal aortic aneurysm) in her sister; Alzheimer's disease (age of onset: 66) in her father; Arthritis (age of onset: 25) in her mother; Breast cancer in her paternal aunt; CVA in her maternal grandfather; Cancer in her maternal grandfather; Congenital heart disease in her daughter; Congestive Heart Failure in her mother; Dementia in her father; Diabetes in her father and sister; Diabetes (age of onset: 73) in her mother; Diabetes (age of onset: 63) in her maternal grandfather; Heart attack in her maternal aunt; Hyperlipidemia in her father; Lung cancer (age of onset: 82) in her maternal grandmother; Stroke in  her father.  ROS:   Please see the history of present illness.  Additional pertinent ROS otherwise unremarkable   EKGs/Labs/Other Studies Reviewed:    The following studies were reviewed today: Monitor 02/13/18 ~3 days of data recorded on Zio monitor. No high degree block or pauses noted. Isolated atrial and ventricular ectopy was rare (<1%). There were 2 patient triggered events, one SVT and one sinus rhythm with PVC.   1 run of tachycardia occurred lasting 11 beats with a max rate of 200 bpm (avg 168 bpm). It is labeled as VT on the monitor. While VT cannot be excluded, it appears likely atrial fibrillation with rate related aberrancy. Atrial Fibrillation occurred continuously (100% burden), ranging from 51-163 bpm (avg of 92 bpm). Isolated VEs were occasional (2.7%), VE Couplets were rare (<1.0%), and VE Triplets were rare (<1.0%, 1). Ventricular Bigeminy and Trigeminy were rare, with individual episodes lasting 5 seconds or less.  Cath 02/07/18  Prox LAD lesion is 20% stenosed.  LV end diastolic pressure is mildly elevated.  There is moderate left ventricular systolic dysfunction.  Hemodynamic findings consistent with mild pulmonary hypertension.   1. Minimal nonobstructive CAD with calcified plaque in the proximal LAD 2. Moderate LV dysfunction. EF estimated at 40-45%.  3. Mildly elevated LV filling pressures. V waves prominent on PCWP tracing 4. Mild pulmonary HTN 5. Low cardiac output. Index 2.06. 6. Mild to Moderate MR.   Plan: Optimize medical therapy for CHF. Consider repeat Echo once medical therapy optimized to reassess valve regurgitation.   Fick Cardiac Output 3.88 L/min  Fick Cardiac Output Index 2.06 (L/min)/BSA  RA A Wave -99 mmHg  RA V Wave 11 mmHg  RA Mean 9 mmHg  RV Systolic Pressure 44 mmHg  RV Diastolic Pressure 2 mmHg  RV EDP 9 mmHg  PA Systolic Pressure 47 mmHg  PA Diastolic Pressure 13 mmHg  PA Mean 34 mmHg  PW A Wave -99 mmHg  PW V Wave 32 mmHg   PW Mean 21 mmHg  AO Systolic Pressure 137 mmHg  AO Diastolic Pressure 73 mmHg  AO Mean 99 mmHg  LV Systolic Pressure 137 mmHg  LV Diastolic Pressure 8 mmHg  LV EDP 18 mmHg  AOp Systolic Pressure 138 mmHg  AOp Diastolic Pressure 67 mmHg  AOp Mean Pressure 94 mmHg  LVp Systolic Pressure 148 mmHg  LVp Diastolic Pressure 10 mmHg  LVp EDP Pressure 15 mmHg  QP/QS 1  TPVR Index 16.45 HRUI  TSVR Index 47.91 HRUI  PVR SVR Ratio 0.1  TPVR/TSVR Ratio 0.34     Echo 01/25/18 Study Conclusions  - Left ventricle: The cavity size was normal. Systolic function was   moderately reduced. The estimated ejection fraction was in the   range of 35% to 40%. Although no diagnostic regional wall motion   abnormality was identified, this possibility cannot be completely   excluded on the basis of this study. - Aortic valve: Transvalvular velocity was within the normal range.   There was no stenosis. There was no regurgitation. - Mitral valve: Moderately calcified annulus. There was moderate   regurgitation. - Left atrium: The atrium was moderately dilated. - Right ventricle: The cavity size was mildly dilated. Wall   thickness was normal. Systolic function was normal. RV systolic   pressure (S, est): 44 mm Hg. - Right atrium: The atrium was moderately dilated. Central venous   pressure (est): 10 mm Hg. - Tricuspid valve: Dilated annulus. Structurally normal valve.   There was moderate-severe regurgitation. - Pulmonic valve: There was mild regurgitation. - Pulmonary arteries: Systolic pressure was moderately increased.   PA peak pressure: 44 mm Hg (S). - Inferior vena cava: The vessel was normal in size. The   respirophasic diameter changes were blunted (< 50%). - Pericardium, extracardiac: There was no pericardial effusion.  EKG:  EKG is personally reviewed.  The ekg ordered today demonstrates atrial fibrillation with heart rate 93 bpm and poor R wave progression.  Recent Labs: 09/24/2019:  ALT 8; BUN 16; Creat 0.96; Hemoglobin 13.2; Platelets 181; Potassium 4.6; Sodium 140  Recent Lipid Panel    Component Value Date/Time   CHOL 225 (H) 09/24/2019 1008   TRIG 86 09/24/2019 1008   HDL 105 09/24/2019 1008   CHOLHDL 2.1 09/24/2019 1008   LDLCALC 102 (H) 09/24/2019 1008    Physical Exam:    VS:  BP 128/74   Pulse 93   Ht 5\' 2"  (1.575 m)   Wt 216 lb (98 kg)   SpO2 99%   BMI 39.51 kg/m     Wt Readings from Last 3 Encounters:  11/06/19 216 lb (98 kg)  09/24/19 216 lb (98 kg)  05/03/19 215 lb (97.5 kg)    GEN: Well nourished, well developed in no acute distress HEENT: Normal, moist mucous membranes NECK: No JVD CARDIAC: irregularly irregular rhythm, normal S1 and S2, no rubs or gallops. No murmur. VASCULAR: Radial and DP pulses 2+ bilaterally. No carotid bruits RESPIRATORY:  Clear to auscultation without rales, wheezing or rhonchi  ABDOMEN: Soft, non-tender, non-distended MUSCULOSKELETAL:  Ambulates independently SKIN: Warm and dry, no edema NEUROLOGIC:  Alert and oriented x 3. No focal neuro deficits noted. PSYCHIATRIC:  Normal affect   ASSESSMENT:    1. Permanent atrial fibrillation (HCC)   2. Nonischemic cardiomyopathy (HCC)   3. Essential hypertension   4. Pure hypercholesterolemia   5. Anticoagulant long-term use   6. Cardiac risk  counseling   7. Counseling on health promotion and disease prevention    PLAN:    Nonischemiccardiomyopathywith reduced EF, moderate MR, and moderate-severe LH:TDSKAJGO on most recent echo.  -cath with minimal nonobstructive CAD -MR more mild-moderate on cath; improved MR and TR on most recent echo -tolerating entresto well. Did discuss that even with improvement in EF, recommendations are to continue entresto given good response. -continue metoprolol succinate -overall NYHA II symptoms  Atrial fibrillation:now permanent  -monitor 01/2018 showed 100% burden of afib.Repeat monitor showed the same 01/2019. -we  discussed rate vs rhythm control strategy. Amiodarone has not kept her in sinus rhythm, and this was discontinued. Declines cardioversion, antiarrhythmic, discussion for repeat ablation as overall she feels well. -rate control with metoprolol -CHA2DS2/VAS Stroke Risk Points=3, continue rivaroxaban for anticoagulation  Hypertension: -well controlled in office and by home numbers -Continue entresto and metoprolol  Hyperlipidemia(hypercholesterolemia):  -continue rosuvastatin, tolerating 4x/week  CV risk counseling and prevention: -recommend heart healthy/Mediterranean diet, with whole grains, fruits, vegetable, fish, lean meats, nuts, and olive oil. Limit salt. -recommend moderate walking, 3-5 times/week for 30-50 minutes each session. Aim for at least 150 minutes.week. Goal should be pace of 3 miles/hours, or walking 1.5 miles in 30 minutes -recommend avoidance of tobacco products. Avoid excess alcohol.  Follow up: 1 year or sooner as needed  Jennifer Red, MD, PhD [place]  [facility]   Medication Adjustments/Labs and Tests Ordered: Current medicines are reviewed at length with the patient today.  Concerns regarding medicines are outlined above.  Orders Placed This Encounter  Procedures  . EKG 12-Lead   No orders of the defined types were placed in this encounter.   Patient Instructions  Medication Instructions:  Your Physician recommend you continue on your current medication as directed.    *If you need a refill on your cardiac medications before your next appointment, please call your pharmacy*   Lab Work: None ordered   Testing/Procedures: None ordered    Follow-Up: At [facility], you and your health needs are our priority.  As part of our continuing mission to provide you with exceptional heart care, we have created designated Provider Care Teams.  These Care Teams include your primary Cardiologist (physician) and Advanced Practice Providers  (APPs -  Physician Assistants and Nurse Practitioners) who all work together to provide you with the care you need, when you need it.  We recommend signing up for the patient portal called "MyChart".  Sign up information is provided on this After Visit Summary.  MyChart is used to connect with patients for Virtual Visits (Telemedicine).  Patients are able to view lab/test results, encounter notes, upcoming appointments, etc.  Non-urgent messages can be sent to your provider as well.   To learn more about what you can do with MyChart, go to [email or web address].    Your next appointment:   1 year(s)  The format for your next appointment:   In Person  Provider:   [name], MD   Other Instructions Goal is 150 minutes/week, broken into whatever amounts you want (can be 5 or 10 minutes at a time) is the goal. Start slow and work up.   [facility] groups (last I checked) were taking new patients, no referral needed.    Signed, Jennifer Red, MD PhD 11/06/2019  [facility]

## 2019-11-06 NOTE — Patient Instructions (Addendum)
Medication Instructions:  Your Physician recommend you continue on your current medication as directed.    *If you need a refill on your cardiac medications before your next appointment, please call your pharmacy*   Lab Work: None ordered   Testing/Procedures: None ordered    Follow-Up: At [facility], you and your health needs are our priority.  As part of our continuing mission to provide you with exceptional heart care, we have created designated Provider Care Teams.  These Care Teams include your primary Cardiologist (physician) and Advanced Practice Providers (APPs -  Physician Assistants and Nurse Practitioners) who all work together to provide you with the care you need, when you need it.  We recommend signing up for the patient portal called "MyChart".  Sign up information is provided on this After Visit Summary.  MyChart is used to connect with patients for Virtual Visits (Telemedicine).  Patients are able to view lab/test results, encounter notes, upcoming appointments, etc.  Non-urgent messages can be sent to your provider as well.   To learn more about what you can do with MyChart, go to [email or web address].    Your next appointment:   1 year(s)  The format for your next appointment:   In Person  Provider:   [name], MD   Other Instructions Goal is 150 minutes/week, broken into whatever amounts you want (can be 5 or 10 minutes at a time) is the goal. Start slow and work up.   [facility] groups (last I checked) were taking new patients, no referral needed.

## 2020-01-06 ENCOUNTER — Encounter: Payer: Self-pay | Admitting: Cardiology

## 2020-01-28 ENCOUNTER — Other Ambulatory Visit: Payer: Self-pay | Admitting: Cardiology

## 2020-02-14 ENCOUNTER — Other Ambulatory Visit: Payer: Self-pay | Admitting: Cardiology

## 2020-02-27 DIAGNOSIS — H25013 Cortical age-related cataract, bilateral: Secondary | ICD-10-CM | POA: Diagnosis not present

## 2020-02-27 DIAGNOSIS — H2513 Age-related nuclear cataract, bilateral: Secondary | ICD-10-CM | POA: Diagnosis not present

## 2020-02-27 DIAGNOSIS — H353131 Nonexudative age-related macular degeneration, bilateral, early dry stage: Secondary | ICD-10-CM | POA: Diagnosis not present

## 2020-02-27 DIAGNOSIS — H524 Presbyopia: Secondary | ICD-10-CM | POA: Diagnosis not present

## 2020-03-24 ENCOUNTER — Ambulatory Visit (INDEPENDENT_AMBULATORY_CARE_PROVIDER_SITE_OTHER): Payer: Medicare Other | Admitting: Nurse Practitioner

## 2020-03-24 ENCOUNTER — Encounter: Payer: Self-pay | Admitting: Nurse Practitioner

## 2020-03-24 ENCOUNTER — Other Ambulatory Visit: Payer: Self-pay

## 2020-03-24 VITALS — BP 126/88 | HR 75 | Temp 96.8°F | Ht 62.0 in | Wt 218.0 lb

## 2020-03-24 DIAGNOSIS — I482 Chronic atrial fibrillation, unspecified: Secondary | ICD-10-CM | POA: Diagnosis not present

## 2020-03-24 DIAGNOSIS — I428 Other cardiomyopathies: Secondary | ICD-10-CM | POA: Diagnosis not present

## 2020-03-24 DIAGNOSIS — Z1231 Encounter for screening mammogram for malignant neoplasm of breast: Secondary | ICD-10-CM | POA: Diagnosis not present

## 2020-03-24 DIAGNOSIS — I4821 Permanent atrial fibrillation: Secondary | ICD-10-CM | POA: Diagnosis not present

## 2020-03-24 DIAGNOSIS — E785 Hyperlipidemia, unspecified: Secondary | ICD-10-CM

## 2020-03-24 DIAGNOSIS — M6530 Trigger finger, unspecified finger: Secondary | ICD-10-CM | POA: Diagnosis not present

## 2020-03-24 DIAGNOSIS — M8580 Other specified disorders of bone density and structure, unspecified site: Secondary | ICD-10-CM | POA: Diagnosis not present

## 2020-03-24 NOTE — Progress Notes (Signed)
Careteam: Patient Care Team: [name], NP as PCP - General (Geriatric Medicine) [name], MD as PCP - Cardiology (Cardiology)  PLACE OF SERVICE:  [facility]  Advanced Directive information Does Patient Have a Medical Advance Directive?: Yes, Type of Advance Directive: Healthcare Power of [place];Living will, Does patient want to make changes to medical advance directive?: No - Patient declined  Allergies  Allergen Reactions  . Latex Rash and Other (See Comments)    welps  . Adhesive [Tape]     Skin irritation   . Grass Extracts [Gramineae Pollens]      Per records from from [employer] [facility]  . Lactose Intolerance (Gi) Other (See Comments)    Unknown  . Naprosyn [Naproxen] Hives  . Coq10 [Coenzyme Q10] Other (See Comments)    Cramps   . Zocor [Simvastatin] Other (See Comments)    cramps    Chief Complaint  Patient presents with  . Medical Management of Chronic Issues    6 month follow-up. Discuss need for mammogram and cologuard. Patient c/o issue with middle finger on left had, finger locks up at night and clicks at times when she attempts to straighten x 6 weeks (2-3 times each week).      HPI: Patient is a 75 y.o. female here for follow up.  Reports she wakes up and her middle finger on her left hand is locked in a bent position. Has to physically straighten the finger back out, very painful. Happens several times throughout the week.   Osteopenia- taking calcium and vitamin D, up to date on her DEXA.  Atrial fibrillation- followed by cardiologist, continues on , xarelto and metoprolol. BP today 126/88, takes BP at home. Brought in BP log from home, running 100-120's/70-90's.  Cardiomyopathy- stable, without worsening of shortness of breath or chest pains, continues on entresto.   Hyperlipidemia- continues taking Crestor, reports she lives a sedentary lifestyle, struggles to eat healthy. Does not want to get out and exercise due to  COVID.   Review of Systems:  Review of Systems  Constitutional: Negative for chills, fever and weight loss.  HENT: Negative for tinnitus.   Respiratory: Negative for cough, sputum production and shortness of breath.   Cardiovascular: Negative for chest pain, palpitations and leg swelling.  Gastrointestinal: Negative for abdominal pain, constipation, nausea and vomiting.  Genitourinary: Negative for dysuria, frequency and urgency.  Musculoskeletal: Positive for joint pain. Negative for falls and myalgias.  Skin: Negative.   Neurological: Negative for dizziness, weakness and headaches.  Psychiatric/Behavioral: Negative for depression and memory loss. The patient does not have insomnia.     Past Medical History:  Diagnosis Date  . Achilles tendinitis    Per [facility] New Patient packet   . Atrial fibrillation [facility])    Per records from [facility] in [place], [place]   . Baker's cyst of knee, left    with resulting lower extremity edema  . Benign cyst of breast, left    Per records from [facility] in [place], [place]. Has yearly U/S in the past   . Cerebrovascular disease    Per [facility] New Patient packet   . Chronic atrial fibrillation [facility])    Per [facility] New Patient packet   . Chronic atrial fibrillation (HCC)    [name], MD  . Decreased hearing, bilateral    referred to audiologist, Per records from [facility] in [place], [place]   . Disorder of bone density and structure, unspecified  Per records from [facility] in [place], [place]   . Disorder of carotid artery (HCC)    Lynden Ang, MD  . Dyslipidemia    [name], MD  . Elevated alkaline phosphatase level    Neg ative ANA 08/2013. Per records from [facility] in [place], [place]   . H/O calculus of kidney during pregnancy    Lynden Ang, MD  . H/O hysterectomy for benign disease    Lynden Ang, MD  . History of fibula fracture    Right, Per records from [facility] in [place], [place]   . History of torn meniscus of right knee    Per records from [facility] in [place], [place]   . Humerus fracture    left leg- due to fall  . Humerus fracture    Resulting from a fall, Per records from [facility] in [place], [place]   . Hypercalcemia    Per records from [facility] in [place], [place]   . Hypertension    Per [facility] New Patient packet   . Kidney stone on left side    Per records from [facility] in [place], [place]   . Lactose intolerance    Per [facility] New Patient packet   . Multiple actinic keratoses    Lynden Ang, MD  . Myalgia due to statin    Per records from [facility] in [place], [place]. Due to crestor   . 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 Normocytic anemia    Lynden Ang, MD  . Obstructive sleep apnea syndrome    Per records from [facility] in [place], [place]. Patient did not tolerate CPAP machine   . Onychomycosis    Per records from [facility] in [place], [place]   . Osteoarthritis    Per [facility] New Patient packet   . Osteopenia    Per records from [facility] in [place], [place]. Treated in the past with antiresorptive therapy  . Renal cyst 10/11/2012   Simple on CT Scan 10/2012. Per records from [facility] in [place], [place]   . 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 Umbilical hernia    Per [facility] New Patient packet   . Vitamin D deficiency    Lynden Ang, MD   Past Surgical History:  Procedure Laterality Date  . ABDOMINAL HYSTERECTOMY  1989   One ovary, Per [facility] New Patient packet   . ABLATION  1996   Radical, Per [facility] New Patient packet   . BREAST CYST ASPIRATION Bilateral    numerous, over several years per pt, always benign  . COLONOSCOPY  2019   Dr.Melissa Fayrene Fearing, Per [facility] New Patient packet   . KNEE ARTHROSCOPY W/ MENISCAL REPAIR  2002   Per [facility] New Patient packet    . MOUTH SURGERY     Dental Implant   . REPLACEMENT TOTAL KNEE Left 2015   Per [facility] New Patient packet  . RIGHT/LEFT HEART CATH AND CORONARY ANGIOGRAPHY N/A 02/07/2018   Procedure: RIGHT/LEFT HEART CATH AND CORONARY ANGIOGRAPHY;  Surgeon: Swaziland, Peter M, MD;  Location: [facility] INVASIVE CV LAB;  Service: Cardiovascular;  Laterality: N/A;  . TONSILLECTOMY  1951   Per [facility] New Patient packet   . TOOTH EXTRACTION  01/12/2019  . TOOTH EXTRACTION  06/05/2019   Preparing for dental implant    Social History:   reports that she  has never smoked. She has never used smokeless tobacco. She reports current alcohol use. She reports that she does not use drugs.  Family History  Problem Relation Age of Onset  . Congestive Heart Failure Mother   . Diabetes Mother 52  . Arthritis Mother 38  . Stroke Father   . Dementia Father   . Diabetes Father   . Hyperlipidemia Father   . Alzheimer's disease Father 4  . Diabetes Sister   . Cancer Maternal Grandfather   . Diabetes Maternal Grandfather 60  . CVA Maternal Grandfather   . AAA (abdominal aortic aneurysm) Sister   . Congenital heart disease Daughter   . Heart attack Maternal Aunt   . Lung cancer Maternal Grandmother 80  . Breast cancer Paternal Aunt     Medications: Patient's Medications  New Prescriptions   No medications on file  Previous Medications   ACETAMINOPHEN (TYLENOL) 650 MG CR TABLET    Take 650 mg by mouth in the morning, at noon, and at bedtime.   CALCIUM CARB-CHOLECALCIFEROL (CALCIUM 600/VITAMIN D3 PO)    Take 1 tablet by mouth 2 (two) times daily.   CHOLECALCIFEROL (VITAMIN D3) 25 MCG (1000 UT) CAPS    Take 1,000 Units by mouth daily.    DEXTROMETHORPHAN-GUAIFENESIN (MUCINEX DM PO)    Take by mouth as needed.   ENTRESTO 49-51 MG    TAKE 1 TABLET BY MOUTH 2 TIMES DAILY   LORATADINE (CLARITIN PO)    Take by mouth daily.   METOPROLOL SUCCINATE (TOPROL-XL) 50 MG 24 HR TABLET    TAKE 1 TABLET BY MOUTH 2 TIMES DAILY. TAKE WITH OR  immediately following A MEAL   MULTIPLE VITAMINS-MINERALS (OCUVITE PO)    Take 1 tablet by mouth daily.    POLYCARBOPHIL (FIBERCON) 625 MG TABLET    Take 1,250 mg by mouth daily.   PROBIOTIC PRODUCT (PHILLIPS COLON HEALTH PO)    Take 1 capsule by mouth daily.    ROSUVASTATIN (CRESTOR) 5 MG TABLET    TAKE 1 TABLET BY MOUTH 4 TIMES A WEEK   XARELTO 20 MG TABS TABLET    TAKE 1 TABLET BY MOUTH EVERY DAY WITH SUPPER   ZINC 50 MG TABS    Take 1 tablet by mouth daily.  Modified Medications   No medications on file  Discontinued Medications   AZITHROMYCIN (ZITHROMAX) 250 MG TABLET    Take 2 tablets x 1 dose then one Tablet by mouth x 4 days    Physical Exam:  Vitals:   03/24/20 1413  BP: 126/88  Pulse: 75  Temp: (!) 96.8 F (36 C)  TempSrc: Temporal  SpO2: 98%  Weight: 218 lb (98.9 kg)  Height: 5\' 2"  (1.575 m)   Body mass index is 39.87 kg/m. Wt Readings from Last 3 Encounters:  03/24/20 218 lb (98.9 kg)  11/06/19 216 lb (98 kg)  09/24/19 216 lb (98 kg)    Physical Exam Constitutional:      General: She is not in acute distress.    Appearance: Normal appearance. She is not diaphoretic.  HENT:     Head: Normocephalic and atraumatic.     Mouth/Throat:     Pharynx: No oropharyngeal exudate.  Eyes:     Conjunctiva/sclera: Conjunctivae normal.     Pupils: Pupils are equal, round, and reactive to light.  Cardiovascular:     Rate and Rhythm: Normal rate. Rhythm irregular.     Heart sounds: Normal heart sounds.  Pulmonary:  Effort: Pulmonary effort is normal.     Breath sounds: Normal breath sounds.  Abdominal:     General: Bowel sounds are normal.     Palpations: Abdomen is soft.  Musculoskeletal:        General: No swelling or tenderness. Normal range of motion.  Skin:    General: Skin is warm and dry.  Neurological:     Mental Status: She is alert and oriented to person, place, and time.  Psychiatric:        Mood and Affect: Mood normal.     Labs  reviewed: Basic Metabolic Panel: Recent Labs    09/24/19 1008  NA 140  K 4.6  CL 104  CO2 32  GLUCOSE 94  BUN 16  CREATININE 0.96*  CALCIUM 9.5   Liver Function Tests: Recent Labs    09/24/19 1008  AST 17  ALT 8  BILITOT 0.5  PROT 6.7   No results for input(s): LIPASE, AMYLASE in the last 8760 hours. No results for input(s): AMMONIA in the last 8760 hours. CBC: Recent Labs    09/24/19 1008  WBC 3.5*  NEUTROABS 1,901  HGB 13.2  HCT 40.6  MCV 100.0  PLT 181   Lipid Panel: Recent Labs    09/24/19 1008  CHOL 225*  HDL 105  LDLCALC 102*  TRIG 86  CHOLHDL 2.1   TSH: No results for input(s): TSH in the last 8760 hours. A1C: Lab Results  Component Value Date   HGBA1C 5.5 01/28/2017     Assessment/Plan 1. Permanent atrial fibrillation (HCC) Followed by cardiologist, continue on current regimen. On xarelto for anticoagulation, no abnormal bruising or bleeding. Continues on metoprolol.   2. Nonischemic cardiomyopathy (HCC) Stable, followed by cardiologist. Continue on entresto BID  3. Osteopenia, unspecified location Continue taking calcium and vitamin d. Encouraged to increase weight bearing exercises to help build muscles and improve balance.  4. Screening mammogram for breast cancer - MM DIGITAL SCREENING BILATERAL; Future  5. Dyslipidemia Continue on crestor 4 times weekly. Encouraged dietary modifications and to increase physical activity.   6. Morbid obesity (HCC) Obesity with hyperlipidemia and osteoarthritis. Educated on patients current weight and risks associated such as stroke, cardiac disease, falls, etc. Encouraged to start eating a healthier diet and to increase physical activity.   7. Stenosing tenosynovitis of finger Educated on interventions such as steroid injections and/or wearing a splint at night to prevent trigger finger. Patient will use a splint for now and will follow up if they decide to proceed with injection.   Next appt: 6  months  I personally was present during the history, physical exam and medical decision-making activities of this service and have verified that the service and findings are accurately documented in the student's note  Trinidad Ingle K. Biagio Borg  [facility] [phone]

## 2020-03-24 NOTE — Patient Instructions (Addendum)
Follow up in 6 months, labs prior to visit  Increase physical activity- 30 mins 5 days a week.      Trigger Finger  Trigger finger, also called stenosing tenosynovitis,  is a condition that causes a finger to get stuck in a bent position. Each finger has a tendon, which is a tough, cord-like tissue that connects muscle to bone, and each tendon passes through a tunnel of tissue called a tendon sheath. To move your finger, your tendon needs to glide freely through the sheath. Trigger finger happens when the tendon or the sheath thickens, making it difficult to move your finger. Trigger finger can affect any finger or a thumb. It may affect more than one finger. Mild cases may clear up with rest and medicine. Severe cases require more treatment. What are the causes? Trigger finger is caused by a thickened finger tendon or tendon sheath. The cause of this thickening is not known. What increases the risk? The following factors may make you more likely to develop this condition:  Doing activities that require a strong grip.  Having rheumatoid arthritis, gout, or diabetes.  Being 40-84 years old.  Being female. What are the signs or symptoms? Symptoms of this condition include:  Pain when bending or straightening your finger.  Tenderness or swelling where your finger attaches to the palm of your hand.  A lump in the palm of your hand or on the inside of your finger.  Hearing a noise like a pop or a snap when you try to straighten your finger.  Feeling a catching or locking sensation when you try to straighten your finger.  Being unable to straighten your finger. How is this diagnosed? This condition is diagnosed based on your symptoms and a physical exam. How is this treated? This condition may be treated by:  Resting your finger and avoiding activities that make symptoms worse.  Wearing a finger splint to keep your finger extended.  Taking NSAIDs, such as ibuprofen, to relieve  pain and swelling.  Doing gentle exercises to stretch the finger as told by your health care provider.  Having medicine that reduces swelling and inflammation (steroids) injected into the tendon sheath. Injections may need to be repeated.  Having surgery to open the tendon sheath. This may be done if other treatments do not work and you cannot straighten your finger. You may need physical therapy after surgery. Follow these instructions at home: If you have a splint:  Wear the splint as told by your health care provider. Remove it only as told by your health care provider.  Loosen it if your fingers tingle, become numb, or turn cold and blue.  Keep it clean.  If the splint is not waterproof: ? Do not let it get wet. ? Cover it with a watertight covering when you take a bath or shower. Managing pain, stiffness, and swelling If directed, apply heat to the affected area as often as told by your health care provider. Use the heat source that your health care provider recommends, such as a moist heat pack or a heating pad.  Place a towel between your skin and the heat source.  Leave the heat on for 20-30 minutes.  Remove the heat if your skin turns bright red. This is especially important if you are unable to feel pain, heat, or cold. You may have a greater risk of getting burned. If directed, put ice on the painful area. To do this:  If you have a removable  splint, remove it as told by your health care provider.  Put ice in a plastic bag.  Place a towel between your skin and the bag or between your splint and the bag.  Leave the ice on for 20 minutes, 2-3 times a day.      Activity  Rest your finger as told by your health care provider. Avoid activities that make the pain worse.  Return to your normal activities as told by your health care provider. Ask your health care provider what activities are safe for you.  Do exercises as told by your health care provider.  Ask your  health care provider when it is safe to drive if you have a splint on your hand. General instructions  Take over-the-counter and prescription medicines only as told by your health care provider.  Keep all follow-up visits as told by your health care provider. This is important. Contact a health care provider if:  Your symptoms are not improving with home care. Summary  Trigger finger, also called stenosing tenosynovitis, causes your finger to get stuck in a bent position. This can make it difficult and painful to straighten your finger.  This condition develops when a finger tendon or tendon sheath thickens.  Treatment may include resting your finger, wearing a splint, and taking medicines.  In severe cases, surgery to open the tendon sheath may be needed. This information is not intended to replace advice given to you by your health care provider. Make sure you discuss any questions you have with your health care provider. Document Revised: 05/15/2018 Document Reviewed: 05/15/2018 Elsevier Patient Education  2021 [employer].

## 2020-03-25 LAB — COMPLETE METABOLIC PANEL WITH GFR
AG Ratio: 1.7 (calc) (ref 1.0–2.5)
ALT: 7 U/L (ref 6–29)
AST: 15 U/L (ref 10–35)
Albumin: 4.2 g/dL (ref 3.6–5.1)
Alkaline phosphatase (APISO): 75 U/L (ref 37–153)
BUN/Creatinine Ratio: 19 (calc) (ref 6–22)
BUN: 18 mg/dL (ref 7–25)
CO2: 31 mmol/L (ref 20–32)
Calcium: 9.8 mg/dL (ref 8.6–10.4)
Chloride: 103 mmol/L (ref 98–110)
Creat: 0.96 mg/dL — ABNORMAL HIGH (ref 0.60–0.93)
GFR, Est African American: 68 mL/min/{1.73_m2} (ref >=60)
GFR, Est Non African American: 59 mL/min/{1.73_m2} — ABNORMAL LOW (ref >=60)
Globulin: 2.5 g/dL (calc) (ref 1.9–3.7)
Glucose, Bld: 91 mg/dL (ref 65–139)
Potassium: 4.5 mmol/L (ref 3.5–5.3)
Sodium: 141 mmol/L (ref 135–146)
Total Bilirubin: 0.5 mg/dL (ref 0.2–1.2)
Total Protein: 6.7 g/dL (ref 6.1–8.1)

## 2020-03-25 LAB — CBC WITH DIFFERENTIAL/PLATELET
Absolute Monocytes: 414 cells/uL (ref 200–950)
Basophils Absolute: 32 cells/uL (ref 0–200)
Basophils Relative: 0.7 %
Eosinophils Absolute: 51 cells/uL (ref 15–500)
Eosinophils Relative: 1.1 %
HCT: 41.9 % (ref 35.0–45.0)
Hemoglobin: 13.8 g/dL (ref 11.7–15.5)
Lymphs Abs: 1444 cells/uL (ref 850–3900)
MCH: 32.5 pg (ref 27.0–33.0)
MCHC: 32.9 g/dL (ref 32.0–36.0)
MCV: 98.8 fL (ref 80.0–100.0)
MPV: 12.8 fL — ABNORMAL HIGH (ref 7.5–12.5)
Monocytes Relative: 9 %
Neutro Abs: 2659 cells/uL (ref 1500–7800)
Neutrophils Relative %: 57.8 %
Platelets: 192 10*3/uL (ref 140–400)
RBC: 4.24 10*6/uL (ref 3.80–5.10)
RDW: 11.9 % (ref 11.0–15.0)
Total Lymphocyte: 31.4 %
WBC: 4.6 10*3/uL (ref 3.8–10.8)

## 2020-03-26 NOTE — Addendum Note (Signed)
Addended by: Maurice Small on: 03/26/2020 10:36 AM   Modules accepted: Orders

## 2020-03-31 DIAGNOSIS — Z1212 Encounter for screening for malignant neoplasm of rectum: Secondary | ICD-10-CM | POA: Diagnosis not present

## 2020-03-31 DIAGNOSIS — Z1211 Encounter for screening for malignant neoplasm of colon: Secondary | ICD-10-CM | POA: Diagnosis not present

## 2020-04-01 LAB — COLOGUARD: Cologuard: NEGATIVE

## 2020-04-06 LAB — COLOGUARD: COLOGUARD: NEGATIVE

## 2020-04-08 ENCOUNTER — Other Ambulatory Visit: Payer: Self-pay | Admitting: Nurse Practitioner

## 2020-04-08 DIAGNOSIS — I428 Other cardiomyopathies: Secondary | ICD-10-CM

## 2020-04-08 DIAGNOSIS — I482 Chronic atrial fibrillation, unspecified: Secondary | ICD-10-CM

## 2020-04-09 ENCOUNTER — Telehealth: Payer: Self-pay

## 2020-04-09 NOTE — Telephone Encounter (Addendum)
Called and dicussed results of cologuard with patient. Results were negative and patient had already been notified of results.Results abstracted

## 2020-04-14 ENCOUNTER — Other Ambulatory Visit: Payer: Self-pay | Admitting: Cardiology

## 2020-04-22 DIAGNOSIS — Z23 Encounter for immunization: Secondary | ICD-10-CM | POA: Diagnosis not present

## 2020-05-07 ENCOUNTER — Other Ambulatory Visit: Payer: Self-pay | Admitting: Cardiology

## 2020-06-27 ENCOUNTER — Encounter (HOSPITAL_BASED_OUTPATIENT_CLINIC_OR_DEPARTMENT_OTHER): Payer: Self-pay

## 2020-07-28 ENCOUNTER — Encounter: Payer: Self-pay | Admitting: Nurse Practitioner

## 2020-07-29 ENCOUNTER — Ambulatory Visit (INDEPENDENT_AMBULATORY_CARE_PROVIDER_SITE_OTHER): Payer: Medicare Other | Admitting: Family

## 2020-07-29 ENCOUNTER — Encounter: Payer: Self-pay | Admitting: Family

## 2020-07-29 ENCOUNTER — Other Ambulatory Visit: Payer: Self-pay

## 2020-07-29 VITALS — BP 138/86 | HR 73 | Temp 97.7°F | Resp 18 | Ht 62.0 in | Wt 220.2 lb

## 2020-07-29 DIAGNOSIS — H1031 Unspecified acute conjunctivitis, right eye: Secondary | ICD-10-CM

## 2020-07-29 MED ORDER — ERYTHROMYCIN 5 MG/GM OP OINT: 1.0000 "application " | TOPICAL_OINTMENT | Freq: Three times a day (TID) | OPHTHALMIC | 0 refills | Status: AC

## 2020-07-29 NOTE — Progress Notes (Signed)
Provider: Teria Khachatryan FNP-C  [name], NP  Patient Care Team: [name], NP as PCP - General (Geriatric Medicine) [name], MD as PCP - Cardiology (Cardiology)  Extended Emergency Contact Information Primary Emergency Contact: Mcdonagh,stephen Mobile Phone: [phone] Relation: Spouse Interpreter needed? No  Code Status:  Full code  Goals of care: Advanced Directive information Advanced Directives 07/29/2020  Does Patient Have a Medical Advance Directive? Yes  Type of Estate agent of [place];Living will  Does patient want to make changes to medical advance directive? No - Patient declined  Copy of Healthcare Power of Attorney in Chart? Yes - validated most recent copy scanned in chart (See row information)     Chief Complaint  Patient presents with   Acute Visit    Patient has cut on outside of eye. It's now red and a little crusty.    HPI:  Pt is a 75 y.o. female seen today for an acute visit for evaluation of right eye redness and crusty.could not open eye yesterday.she used a warm wash cloth to wipe off crusty yellow discharge. Also used baby shampoo to clean eye which seems to have help but eye still red.  She denies any fever,chills or s/sx of URI. No vision changes.Has had similar issue in the past was prescribed ointment and washed with baby shampoo which helped.    Past Medical History:  Diagnosis Date   Achilles tendinitis    Per [facility] New Patient packet    Atrial fibrillation [facility])    Per records from [facility] in [place], [place]    Baker's cyst of knee, left    with resulting lower extremity edema   Benign cyst of breast, left    Per [facility] [place], [place]. 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 Per records from [facility] in [place], [place]    H/O calculus of kidney during pregnancy    Lynden Ang, MD   H/O hysterectomy for benign disease    Lynden Ang, MD   History of fibula fracture    Right, Per records from [facility] in [place], [place]    History of torn meniscus of right knee    Per records from [facility] in [place], [place]    Humerus fracture    left leg- due to fall   Humerus fracture    Resulting from a fall, Per records from [facility] in [place], [place]    Hypercalcemia    Per records from [facility] in [place], [place]    Hypertension    Per [facility] New Patient packet    Kidney stone on left side    Per records from [facility] in [place], [place]    Lactose intolerance  Per [facility] New Patient packet    Multiple actinic keratoses    Lynden Ang, MD   Myalgia due to statin    Per records from [facility] in [place], [place]. Due to crestor    Non-toxic multinodular goiter 01/21/2017   Lynden Ang, MD   Normocytic anemia    Lynden Ang, MD   Obstructive sleep apnea syndrome    Per records from [facility] in [place], [place]. Patient did not tolerate CPAP machine    Onychomycosis    Per records from [facility] in [place], [place]    Osteoarthritis    Per [facility] New Patient packet    Osteopenia    Per records from [facility] in [place], [place]. Treated in the past with antiresorptive therapy   Renal cyst 10/11/2012   Simple on CT Scan 10/2012. 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   Lynden Ang , MD   Umbilical hernia    Per [facility] New Patient packet    Vitamin D deficiency    Lynden Ang, MD   Past Surgical History:  Procedure Laterality Date   ABDOMINAL HYSTERECTOMY  1989   One ovary, Per [facility] New Patient packet    ABLATION  1996   Radical, Per [facility] New Patient packet    BREAST CYST ASPIRATION Bilateral    numerous, over several years per pt, always benign   COLONOSCOPY  2019   Dr.Melissa Fayrene Fearing, Per [facility] New Patient packet    KNEE ARTHROSCOPY W/ MENISCAL REPAIR  2002   Per [facility] New Patient packet    MOUTH SURGERY     Dental Implant    REPLACEMENT TOTAL KNEE Left 2015   Per [facility] New Patient packet   RIGHT/LEFT HEART CATH AND CORONARY ANGIOGRAPHY N/A 02/07/2018   Procedure: RIGHT/LEFT HEART CATH AND CORONARY ANGIOGRAPHY;  Surgeon: Swaziland, Peter M, MD;  [facility];  Service: Cardiovascular;  Laterality: N/A;   TONSILLECTOMY  1951   Per [facility] New Patient packet    TOOTH EXTRACTION  01/12/2019   TOOTH EXTRACTION  06/05/2019   Preparing for dental implant     Allergies  Allergen Reactions   Latex Rash and Other (See Comments)    welps   Adhesive [Tape]     Skin irritation    Grass Extracts [Gramineae Pollens]      Per records from from [facility]   Lactose Intolerance (Gi) Other (See Comments)    Unknown   Naprosyn [Naproxen] Hives   Coq10 [Coenzyme Q10] Other (See Comments)    Cramps    Zocor [Simvastatin] Other (See Comments)    cramps    Outpatient Encounter Medications as of 07/29/2020  Medication Sig   acetaminophen (TYLENOL) 650 MG CR tablet Take 650 mg by mouth in the morning, at noon, and at bedtime.   Calcium Carb-Cholecalciferol (CALCIUM 600/VITAMIN D3 PO) Take 1 tablet by mouth 2 (two) times daily.   Cholecalciferol (VITAMIN D3) 25 MCG (1000 UT) CAPS Take 1,000 Units by mouth daily.     Dextromethorphan-guaiFENesin (MUCINEX DM PO) Take by mouth as needed.   ENTRESTO 49-51 MG TAKE 1 TABLET BY MOUTH 2 TIMES DAILY   Loratadine (CLARITIN PO) Take by mouth daily.   metoprolol succinate (TOPROL-XL) 50 MG 24 hr tablet TAKE 1 TABLET BY MOUTH 2 TIMES DAILY. TAKE WITH OR immediately following A MEAL   Multiple Vitamins-Minerals (OCUVITE PO) Take  1 tablet by mouth daily.    polycarbophil (FIBERCON) 625 MG tablet Take 1,250 mg by mouth daily.   Probiotic Product (PHILLIPS COLON HEALTH PO) Take 1 capsule by mouth daily.    rosuvastatin (CRESTOR) 5 MG tablet TAKE 1 TABLET BY MOUTH 4 TIMES A WEEK   XARELTO 20 MG TABS tablet TAKE 1 TABLET BY MOUTH EVERY DAY WITH SUPPER   Zinc 50 MG TABS Take 1 tablet by mouth daily.   No facility-administered encounter medications on file as of 07/29/2020.    Review of Systems  Constitutional:  Negative for appetite change, chills, fatigue and fever.  HENT:  Negative for congestion, rhinorrhea, sinus pressure, sinus pain, sneezing and sore throat.   Eyes:  Positive for discharge and redness. Negative for pain, itching and visual disturbance.       Right eye redness and crusty drainage   Respiratory:  Negative for cough, chest tightness, shortness of breath and wheezing.   Skin:  Negative for color change, pallor and rash.  Neurological:  Negative for dizziness, light-headedness and headaches.   Immunization History  Administered Date(s) Administered   Fluad Quad(high Dose 65+) 09/14/2018, 09/24/2019   Influenza, High Dose Seasonal PF 10/31/2017   Influenza, Quadrivalent, Recombinant, Inj, Pf 10/11/2016   Influenza,inj,quad, With Preservative 10/11/2016   PFIZER(Purple Top)SARS-COV-2 Vaccination 01/30/2019, 02/19/2019, 11/06/2019   Pneumococcal Conjugate-13 11/20/2013   Pneumococcal Polysaccharide-23 11/13/2012   Tdap 11/27/2007, 07/05/2018   Zoster Recombinat (Shingrix) 08/14/2016   Zoster, Live 05/08/2016   Pertinent  Health Maintenance Due   Topic Date Due   MAMMOGRAM  03/22/2020   INFLUENZA VACCINE  08/11/2020   DEXA SCAN  Completed   PNA vac Low Risk Adult  Completed   Fall Risk  07/29/2020 03/24/2020 09/24/2019 03/19/2019 03/19/2019  Falls in the past year? 0 1 1 0 0  Number falls in past yr: 0 0 0 0 0  Injury with Fall? 0 0 1 0 0  Comment - - Sprain right wrist - -  Risk for fall due to : No Fall Risks - Other (Comment) - -  Risk for fall due to: Comment - - wore wrong shoes - -  Follow up Falls evaluation completed - - - -   Functional Status Survey:    Vitals:   07/29/20 1552  BP: 138/86  Pulse: 73  Resp: 18  Temp: 97.7 F (36.5 C)  SpO2: 97%  Weight: 220 lb 3.2 oz (99.9 kg)  Height: 5\' 2"  (1.575 m)   Body mass index is 40.28 kg/m. Physical Exam Vitals reviewed.  Constitutional:      General: She is not in acute distress.    Appearance: She is obese. She is not ill-appearing.  HENT:     Head: Normocephalic.  Eyes:     General: No scleral icterus.       Right eye: No discharge.        Left eye: No discharge.     Conjunctiva/sclera:     Right eye: Right conjunctiva is injected. Exudate present. No hemorrhage.    Left eye: Left conjunctiva is not injected. No exudate or hemorrhage.    Pupils: Pupils are equal, round, and reactive to light.  Cardiovascular:     Rate and Rhythm: Normal rate and regular rhythm.     Pulses: Normal pulses.     Heart sounds: Normal heart sounds. No murmur heard.   No friction rub. No gallop.  Pulmonary:     Effort: Pulmonary effort is normal. No  respiratory distress.     Breath sounds: Normal breath sounds. No wheezing, rhonchi or rales.  Chest:     Chest wall: No tenderness.  Musculoskeletal:     Cervical back: Normal range of motion. No rigidity or tenderness.  Lymphadenopathy:     Cervical: No cervical adenopathy.  Skin:    General: Skin is warm and dry.     Coloration: Skin is not pale.     Findings: No bruising, erythema or rash.  Neurological:     Mental  Status: She is alert and oriented to person, place, and time.     Cranial Nerves: No cranial nerve deficit.     Motor: No weakness.     Gait: Gait normal.  Psychiatric:        Mood and Affect: Mood normal.        Behavior: Behavior normal.        Thought Content: Thought content normal.        Judgment: Judgment normal.    Labs reviewed: Recent Labs    09/24/19 1008 03/24/20 1502  NA 140 141  K 4.6 4.5  CL 104 103  CO2 32 31  GLUCOSE 94 91  BUN 16 18  CREATININE 0.96* 0.96*  CALCIUM 9.5 9.8   Recent Labs    09/24/19 1008 03/24/20 1502  AST 17 15  ALT 8 7  BILITOT 0.5 0.5  PROT 6.7 6.7   Recent Labs    09/24/19 1008 03/24/20 1502  WBC 3.5* 4.6  NEUTROABS 1,901 2,659  HGB 13.2 13.8  HCT 40.6 41.9  MCV 100.0 98.8  PLT 181 192   Lab Results  Component Value Date   TSH 1.74 12/07/2017   Lab Results  Component Value Date   HGBA1C 5.5 01/28/2017   Lab Results  Component Value Date   CHOL 225 (H) 09/24/2019   HDL 105 09/24/2019   LDLCALC 102 (H) 09/24/2019   TRIG 86 09/24/2019   CHOLHDL 2.1 09/24/2019    Significant Diagnostic Results in last 30 days:  No results found.  Assessment/Plan  Acute bacterial conjunctivitis of right eye Afebrile. Right eye conjunctiva redness with reported yellow crusty drainage. - Advised to wash right eye with warm water compressor and baby shampoo  Will treat with antibiotic ointment as below.  - erythromycin ophthalmic ointment; Place 1 application into the right eye 3 (three) times daily for 7 days.  Dispense: 3.5 g; Refill: 0 - Notify provider if symptoms worsen or fail to improve  Family/ staff Communication: Reviewed plan of care with patient verbalized understanding.   Labs/tests ordered: None   Next Appointment: As needed if symptoms worsen or fail to improve    Caesar Bookman, NP

## 2020-07-29 NOTE — Patient Instructions (Signed)
-  wash eye with warm water wash cloth compressor  and baby shampoo prior to applying eye ointment  - Notify provider if symptoms worsen or fail to improve

## 2020-08-19 ENCOUNTER — Other Ambulatory Visit: Payer: Self-pay | Admitting: Nurse Practitioner

## 2020-08-19 DIAGNOSIS — E7800 Pure hypercholesterolemia, unspecified: Secondary | ICD-10-CM

## 2020-08-19 DIAGNOSIS — I4821 Permanent atrial fibrillation: Secondary | ICD-10-CM

## 2020-08-28 ENCOUNTER — Other Ambulatory Visit: Payer: Self-pay | Admitting: Cardiology

## 2020-08-28 NOTE — Telephone Encounter (Signed)
Rx(s) sent to pharmacy electronically.  

## 2020-09-09 ENCOUNTER — Other Ambulatory Visit: Payer: Self-pay | Admitting: Cardiology

## 2020-09-09 NOTE — Telephone Encounter (Signed)
Prescription refill request for Xarelto received.  Indication:afib Last office visit:christopher 11/06/19 Weight:99.9kg Age:18f Scr:0.96 03/24/20 CrCl: 82.3

## 2020-09-17 DIAGNOSIS — Z1231 Encounter for screening mammogram for malignant neoplasm of breast: Secondary | ICD-10-CM | POA: Diagnosis not present

## 2020-09-17 LAB — HM MAMMOGRAPHY

## 2020-09-19 ENCOUNTER — Encounter: Payer: Self-pay | Admitting: *Deleted

## 2020-09-23 ENCOUNTER — Other Ambulatory Visit: Payer: Medicare Other

## 2020-09-23 ENCOUNTER — Other Ambulatory Visit: Payer: Self-pay

## 2020-09-23 DIAGNOSIS — I4821 Permanent atrial fibrillation: Secondary | ICD-10-CM

## 2020-09-23 DIAGNOSIS — E7800 Pure hypercholesterolemia, unspecified: Secondary | ICD-10-CM

## 2020-09-24 LAB — CBC WITH DIFFERENTIAL/PLATELET
Absolute Monocytes: 365 cells/uL (ref 200–950)
Basophils Absolute: 22 cells/uL (ref 0–200)
Basophils Relative: 0.5 %
Eosinophils Absolute: 31 cells/uL (ref 15–500)
Eosinophils Relative: 0.7 %
HCT: 41.8 % (ref 35.0–45.0)
Hemoglobin: 13.5 g/dL (ref 11.7–15.5)
Lymphs Abs: 1742 cells/uL (ref 850–3900)
MCH: 32 pg (ref 27.0–33.0)
MCHC: 32.3 g/dL (ref 32.0–36.0)
MCV: 99.1 fL (ref 80.0–100.0)
MPV: 12.9 fL — ABNORMAL HIGH (ref 7.5–12.5)
Monocytes Relative: 8.3 %
Neutro Abs: 2240 cells/uL (ref 1500–7800)
Neutrophils Relative %: 50.9 %
Platelets: 180 10*3/uL (ref 140–400)
RBC: 4.22 10*6/uL (ref 3.80–5.10)
RDW: 12.1 % (ref 11.0–15.0)
Total Lymphocyte: 39.6 %
WBC: 4.4 10*3/uL (ref 3.8–10.8)

## 2020-09-24 LAB — LIPID PANEL
Cholesterol: 199 mg/dL (ref <200)
HDL: 90 mg/dL (ref >=50)
LDL Cholesterol (Calc): 91 mg/dL (calc)
Non-HDL Cholesterol (Calc): 109 mg/dL (calc) (ref <130)
Total CHOL/HDL Ratio: 2.2 (calc) (ref <5.0)
Triglycerides: 87 mg/dL (ref <150)

## 2020-09-24 LAB — COMPLETE METABOLIC PANEL WITH GFR
AG Ratio: 1.6 (calc) (ref 1.0–2.5)
ALT: 5 U/L — ABNORMAL LOW (ref 6–29)
AST: 15 U/L (ref 10–35)
Albumin: 4.2 g/dL (ref 3.6–5.1)
Alkaline phosphatase (APISO): 74 U/L (ref 37–153)
BUN: 19 mg/dL (ref 7–25)
CO2: 26 mmol/L (ref 20–32)
Calcium: 9.8 mg/dL (ref 8.6–10.4)
Chloride: 104 mmol/L (ref 98–110)
Creat: 0.97 mg/dL (ref 0.60–1.00)
Globulin: 2.7 g/dL (calc) (ref 1.9–3.7)
Glucose, Bld: 100 mg/dL — ABNORMAL HIGH (ref 65–99)
Potassium: 4.4 mmol/L (ref 3.5–5.3)
Sodium: 138 mmol/L (ref 135–146)
Total Bilirubin: 0.6 mg/dL (ref 0.2–1.2)
Total Protein: 6.9 g/dL (ref 6.1–8.1)
eGFR: 62 mL/min/{1.73_m2} (ref >=60)

## 2020-09-25 DIAGNOSIS — R928 Other abnormal and inconclusive findings on diagnostic imaging of breast: Secondary | ICD-10-CM | POA: Diagnosis not present

## 2020-09-26 ENCOUNTER — Other Ambulatory Visit: Payer: Self-pay

## 2020-09-26 ENCOUNTER — Ambulatory Visit
Admission: RE | Admit: 2020-09-26 | Discharge: 2020-09-26 | Disposition: A | Discharge status: Home / Self Care | Payer: Medicare Other | Source: Ambulatory Visit | Attending: Nurse Practitioner | Admitting: Nurse Practitioner

## 2020-09-26 ENCOUNTER — Ambulatory Visit (INDEPENDENT_AMBULATORY_CARE_PROVIDER_SITE_OTHER): Payer: Medicare Other | Admitting: Nurse Practitioner

## 2020-09-26 ENCOUNTER — Encounter: Payer: Self-pay | Admitting: Nurse Practitioner

## 2020-09-26 VITALS — BP 124/80 | HR 111 | Temp 98.7°F | Ht 62.0 in | Wt 220.0 lb

## 2020-09-26 DIAGNOSIS — M7662 Achilles tendinitis, left leg: Secondary | ICD-10-CM | POA: Diagnosis not present

## 2020-09-26 DIAGNOSIS — M25562 Pain in left knee: Secondary | ICD-10-CM | POA: Diagnosis not present

## 2020-09-26 DIAGNOSIS — I10 Essential (primary) hypertension: Secondary | ICD-10-CM | POA: Diagnosis not present

## 2020-09-26 DIAGNOSIS — E7800 Pure hypercholesterolemia, unspecified: Secondary | ICD-10-CM | POA: Diagnosis not present

## 2020-09-26 DIAGNOSIS — I4821 Permanent atrial fibrillation: Secondary | ICD-10-CM

## 2020-09-26 DIAGNOSIS — Z23 Encounter for immunization: Secondary | ICD-10-CM | POA: Diagnosis not present

## 2020-09-26 NOTE — Progress Notes (Signed)
Careteam: Patient Care Team: [name], NP as PCP - General (Geriatric Medicine) [name], MD as PCP - Cardiology (Cardiology)  PLACE OF SERVICE:  [facility]  Advanced Directive information Does Patient Have a Medical Advance Directive?: Yes, Type of Advance Directive: Healthcare Power of [place];Living will, Does patient want to make changes to medical advance directive?: No - Patient declined  Allergies  Allergen Reactions   Latex Rash and Other (See Comments)    welps   Adhesive [Tape]     Skin irritation    Grass Extracts [Gramineae Pollens]      Per records from from [facility]   Lactose Intolerance (Gi) Other (See Comments)    Unknown   Naprosyn [Naproxen] Hives   Coq10 [Coenzyme Q10] Other (See Comments)    Cramps    Zocor [Simvastatin] Other (See Comments)    cramps    Chief Complaint  Patient presents with   Medical Management of Chronic Issues    6 month follow-up. Discuss need for coivd #4 (will receive next Wednesday at [facility]). Flu vaccine today. Examine left knee, patient twisted in July and still with pain.      HPI: Patient is a 75 y.o. female for routine follow up  2.5 months ago she feel and twisted her knee. Feels like her knee is "raw"  the pain from fall and twist has improved.  She can walk on the leg fine. Thought that the pain would resolve but it has not. Knee replacement was done in [place].  Taking water fitness classes twice a week.   Has yearly follow up with cardiologist in October- feels a fib often.  Continues on xarelto for a fib.   Trying to work on diet- staying away from sweets.  Has a hard time staying away from carbs   Review of Systems:  Review of Systems  Constitutional:  Negative for chills, fever and weight loss.  HENT:  Negative for tinnitus.   Respiratory:  Negative for cough, sputum production and shortness of breath.   Cardiovascular:  Negative for chest pain,  palpitations and leg swelling.  Gastrointestinal:  Negative for abdominal pain, constipation, diarrhea and heartburn.  Genitourinary:  Negative for dysuria, frequency and urgency.  Musculoskeletal:  Negative for back pain, falls, joint pain and myalgias.  Skin: Negative.   Neurological:  Negative for dizziness and headaches.  Psychiatric/Behavioral:  Negative for depression and memory loss. The patient does not have insomnia.    Past Medical History:  Diagnosis Date   Achilles tendinitis    Per [facility] New Patient packet    Atrial fibrillation [facility])    Per records from [facility] in [place], [place]    Baker's cyst of knee, left    with resulting lower extremity edema   Benign cyst of breast, left    Per records from [facility] in [place], [place]. 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 Per records from [facility] in [place], [place]    H/O calculus of kidney during pregnancy    Lynden Ang, MD   H/O hysterectomy for benign disease    Lynden Ang, MD   History of fibula fracture    Right, Per records from [facility] in [place], [place]    History of torn meniscus of right knee    Per records from [facility] in [place], [place]    Humerus fracture    left leg- due to fall   Humerus fracture    Resulting from a fall, Per records from [facility] in [place], [place]     Hypercalcemia    Per records from [facility] in [place], [place]    Hypertension    Per [facility] New Patient packet    Kidney stone on left side    Per records from [facility] in [place], [place]    Lactose intolerance    Per [facility] New Patient packet    Multiple actinic keratoses    Lynden Ang, MD   Myalgia due to statin    Per records from [facility] in [place], [place]. Due to crestor    Non-toxic multinodular goiter 01/21/2017   Lynden Ang, MD   Normocytic anemia    Lynden Ang, MD   Obstructive sleep apnea syndrome    Per records from [facility] in [place], [place]. Patient did not tolerate CPAP machine    Onychomycosis    Per records from [facility] in [place], [place]    Osteoarthritis    Per [facility] New Patient packet    Osteopenia    Per records from [facility] in [place], [place]. Treated in the past with antiresorptive therapy   Renal cyst 10/11/2012   Simple on CT Scan 10/2012. Per records from [facility] in [place], [place]    Sensorineural hearing loss (SNHL), bilateral 01/28/2017   Lynden Ang , MD   Umbilical hernia    Per [facility] New Patient packet    Vitamin D deficiency    Lynden Ang, MD   Past Surgical History:  Procedure Laterality Date   ABDOMINAL HYSTERECTOMY  1989   One ovary, Per [facility] New Patient packet    ABLATION  1996   Radical, Per [facility] New Patient packet    BREAST CYST ASPIRATION Bilateral    numerous, over several years per pt, always benign   COLONOSCOPY  2019   Dr.Melissa Fayrene Fearing, Per [facility] New Patient packet    KNEE ARTHROSCOPY W/ MENISCAL REPAIR  2002   Per [facility] New Patient packet    MOUTH SURGERY     Dental Implant    REPLACEMENT TOTAL KNEE Left 2015   Per [facility] New Patient packet   RIGHT/LEFT HEART CATH AND CORONARY ANGIOGRAPHY N/A 02/07/2018   Procedure: RIGHT/LEFT HEART CATH AND CORONARY ANGIOGRAPHY;  Surgeon: Swaziland, Peter M, MD;  [facility];  Service:  Cardiovascular;  Laterality: N/A;   TONSILLECTOMY  1951   Per [facility] New Patient packet    TOOTH EXTRACTION  01/12/2019   TOOTH EXTRACTION  06/05/2019   Preparing for dental implant    Social History:   reports that she has never smoked. She has never used smokeless tobacco. She reports current alcohol use. She reports that she does not use  drugs.  Family History  Problem Relation Age of Onset   Congestive Heart Failure Mother    Diabetes Mother 34   Arthritis Mother 7   Stroke Father    Dementia Father    Diabetes Father    Hyperlipidemia Father    Alzheimer's disease Father 42   Diabetes Sister    Cancer Maternal Grandfather    Diabetes Maternal Grandfather 7   CVA Maternal Grandfather    AAA (abdominal aortic aneurysm) Sister    Congenital heart disease Daughter    Heart attack Maternal Aunt    Lung cancer Maternal Grandmother 7   Breast cancer Paternal Aunt     Medications: Patient's Medications  New Prescriptions   No medications on file  Previous Medications   ACETAMINOPHEN (TYLENOL) 650 MG CR TABLET    Take 650 mg by mouth in the morning, at noon, and at bedtime.   CALCIUM CARB-CHOLECALCIFEROL (CALCIUM 600/VITAMIN D3 PO)    Take 1 tablet by mouth 2 (two) times daily.   CHOLECALCIFEROL (VITAMIN D3) 25 MCG (1000 UT) CAPS    Take 1,000 Units by mouth daily.    ENTRESTO 49-51 MG    TAKE 1 TABLET BY MOUTH 2 TIMES DAILY   LORATADINE (CLARITIN PO)    Take by mouth daily.   METOPROLOL SUCCINATE (TOPROL-XL) 50 MG 24 HR TABLET    TAKE 1 TABLET BY MOUTH 2 TIMES DAILY. TAKE WITH OR immediately following A MEAL   MULTIPLE VITAMINS-MINERALS (OCUVITE PO)    Take 1 tablet by mouth daily.    POLYCARBOPHIL (FIBERCON) 625 MG TABLET    Take 1,250 mg by mouth daily.   PROBIOTIC PRODUCT (PHILLIPS COLON HEALTH PO)    Take 1 capsule by mouth daily.    ROSUVASTATIN (CRESTOR) 5 MG TABLET    TAKE 1 TABLET BY MOUTH 4 TIMES A WEEK   XARELTO 20 MG TABS TABLET    TAKE 1 TABLET EVERY DAY WITH  SUPPER   ZINC 50 MG TABS    Take 1 tablet by mouth daily.  Modified Medications   No medications on file  Discontinued Medications   No medications on file    Physical Exam:  Vitals:   09/26/20 1431  BP: 124/80  Pulse: (!) 111  Temp: 98.7 F (37.1 C)  TempSrc: Temporal  SpO2: 99%  Weight: 220 lb (99.8 kg)  Height: 5\' 2"  (1.575 m)   Body mass index is 40.24 kg/m. Wt Readings from Last 3 Encounters:  09/26/20 220 lb (99.8 kg)  07/29/20 220 lb 3.2 oz (99.9 kg)  03/24/20 218 lb (98.9 kg)    Physical Exam Constitutional:      General: She is not in acute distress.    Appearance: She is well-developed. She is not diaphoretic.  HENT:     Head: Normocephalic and atraumatic.     Mouth/Throat:     Pharynx: No oropharyngeal exudate.  Eyes:     Conjunctiva/sclera: Conjunctivae normal.     Pupils: Pupils are equal, round, and reactive to light.  Cardiovascular:     Rate and Rhythm: Normal rate and regular rhythm.     Heart sounds: Normal heart sounds.  Pulmonary:     Effort: Pulmonary effort is normal.     Breath sounds: Normal breath sounds.  Abdominal:     General: Bowel sounds are normal.     Palpations: Abdomen is soft.  Musculoskeletal:        General: No swelling or deformity.  Cervical back: Normal range of motion and neck supple.     Right knee: Normal.     Left knee: Tenderness (anterior knee) present. No LCL laxity, MCL laxity, ACL laxity or PCL laxity.Normal patellar mobility.     Right lower leg: No edema.     Left lower leg: No edema.  Skin:    General: Skin is warm and dry.  Neurological:     Mental Status: She is alert.  Psychiatric:        Mood and Affect: Mood normal.    Labs reviewed: Basic Metabolic Panel: Recent Labs    03/24/20 1502 09/23/20 0940  NA 141 138  K 4.5 4.4  CL 103 104  CO2 31 26  GLUCOSE 91 100*  BUN 18 19  CREATININE 0.96* 0.97  CALCIUM 9.8 9.8   Liver Function Tests: Recent Labs    03/24/20 1502 09/23/20 0940   AST 15 15  ALT 7 5*  BILITOT 0.5 0.6  PROT 6.7 6.9   No results for input(s): LIPASE, AMYLASE in the last 8760 hours. No results for input(s): AMMONIA in the last 8760 hours. CBC: Recent Labs    03/24/20 1502 09/23/20 0940  WBC 4.6 4.4  NEUTROABS 2,659 2,240  HGB 13.8 13.5  HCT 41.9 41.8  MCV 98.8 99.1  PLT 192 180   Lipid Panel: Recent Labs    09/23/20 0940  CHOL 199  HDL 90  LDLCALC 91  TRIG 87  CHOLHDL 2.2   TSH: No results for input(s): TSH in the last 8760 hours. A1C: Lab Results  Component Value Date   HGBA1C 5.5 01/28/2017     Assessment/Plan  1. Need for influenza vaccination - Flu Vaccine QUAD High Dose(Fluad)  2. Acute pain of left knee Ongoing after fall.good stability on exam and normal ROM - Ambulatory referral to Physical Therapy - DG Knee Complete 4 Views Left; Future  3. Permanent atrial fibrillation (HCC) -rate controlled on metoprolol. Continues on xarelto for anticoagulation.   4. Pure hypercholesterolemia -continues on crestor, continue lifestyle modifications with medication.   5. Morbid obesity (HCC) -education provided on healthy weight loss through increase in physical activity and proper nutrition    6. Essential hypertension --stable. Goal bp <140/90. Continue on current regimen.  with low sodium diet.    7. Achilles tendinitis of left lower extremity -will have recurrent flares. Some exercises she does exacerbate pain. Would like PT to evaluate and treat if possible - Ambulatory referral to Physical Therap   Next appt: 6 months. Janene Harvey. Biagio Borg  [facility] [phone]

## 2020-09-29 DIAGNOSIS — Z23 Encounter for immunization: Secondary | ICD-10-CM | POA: Diagnosis not present

## 2020-10-02 ENCOUNTER — Encounter: Payer: Self-pay | Admitting: Nurse Practitioner

## 2020-10-03 ENCOUNTER — Other Ambulatory Visit: Payer: Self-pay | Admitting: Nurse Practitioner

## 2020-10-03 DIAGNOSIS — I428 Other cardiomyopathies: Secondary | ICD-10-CM

## 2020-10-03 DIAGNOSIS — I482 Chronic atrial fibrillation, unspecified: Secondary | ICD-10-CM

## 2020-10-13 ENCOUNTER — Other Ambulatory Visit: Payer: Self-pay

## 2020-10-13 ENCOUNTER — Encounter (HOSPITAL_BASED_OUTPATIENT_CLINIC_OR_DEPARTMENT_OTHER): Payer: Self-pay | Admitting: Physical Therapy

## 2020-10-13 ENCOUNTER — Ambulatory Visit (HOSPITAL_BASED_OUTPATIENT_CLINIC_OR_DEPARTMENT_OTHER): Payer: Medicare Other | Attending: Nurse Practitioner | Admitting: Physical Therapy

## 2020-10-13 DIAGNOSIS — M6281 Muscle weakness (generalized): Secondary | ICD-10-CM

## 2020-10-13 DIAGNOSIS — G8929 Other chronic pain: Secondary | ICD-10-CM

## 2020-10-13 DIAGNOSIS — R262 Difficulty in walking, not elsewhere classified: Secondary | ICD-10-CM

## 2020-10-13 DIAGNOSIS — M25562 Pain in left knee: Secondary | ICD-10-CM | POA: Insufficient documentation

## 2020-10-13 DIAGNOSIS — M7662 Achilles tendinitis, left leg: Secondary | ICD-10-CM | POA: Diagnosis not present

## 2020-10-13 DIAGNOSIS — M79672 Pain in left foot: Secondary | ICD-10-CM

## 2020-10-13 NOTE — Therapy (Signed)
OUTPATIENT PHYSICAL THERAPY LOWER EXTREMITY EVALUATION   Patient Name: Jennifer Mcclain MRN: [id number] DOB:April 09, 1945, 75 y.o., female Today's Date: 10/13/2020   PT End of Session - 10/13/20 1521     Visit Number 1    Number of Visits 17    Date for PT Re-Evaluation 01/11/21    Authorization Type Medicare    PT Start Time 1515    PT Stop Time 1600    PT Time Calculation (min) 45 min    Activity Tolerance Patient tolerated treatment well;Patient limited by pain    Behavior During Therapy [facility] for tasks assessed/performed             Past Medical History:  Diagnosis Date   Achilles tendinitis    Per [facility] New Patient packet    Atrial fibrillation [facility])    Per records from [facility] in [place], [place]    Baker's cyst of knee, left    with resulting lower extremity edema   Benign cyst of breast, left    Per records from [facility] in [place], [place]. 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)    Lynden Ang, MD   Dyslipidemia    [name], MD   Elevated alkaline phosphatase level    Neg ative ANA 08/2013. Per records from [facility] in [place], [place]    H/O calculus of kidney during pregnancy    Lynden Ang, MD   H/O hysterectomy for benign disease    Lynden Ang, MD   History of fibula fracture    Right, Per records from [facility] in [place], [place]    History of torn meniscus of right knee    Per records from [facility] in [place], [place]    Humerus fracture    left leg-  due to fall   Humerus fracture    Resulting from a fall, Per records from [facility] in [place], [place]    Hypercalcemia    Per records from [facility] in [place], [place]    Hypertension    Per [facility] New Patient packet    Kidney stone on left side    Per records from [facility] in [place], [place]    Lactose intolerance    Per [facility] New Patient packet    Multiple actinic keratoses    Lynden Ang, MD   Myalgia due to statin    Per records from [facility] in [place], [place]. Due to crestor    Non-toxic multinodular goiter 01/21/2017   Lynden Ang, MD   Normocytic anemia    Lynden Ang, MD   Obstructive sleep apnea syndrome    Per records from [facility] in [place], [place]. Patient did not tolerate CPAP machine    Onychomycosis    Per records from [facility] in [place], [place]    Osteoarthritis    Per [facility] New Patient packet    Osteopenia    Per  records from [facility] in [place], [place]. Treated in the past with antiresorptive therapy   Renal cyst 10/11/2012   Simple on CT Scan 10/2012. Per records from [facility] in [place], [place]    Sensorineural hearing loss (SNHL), bilateral 01/28/2017   Lynden Ang , MD   Umbilical hernia    Per [facility] New Patient packet    Vitamin D deficiency    Lynden Ang, MD   Past Surgical History:  Procedure Laterality Date   ABDOMINAL HYSTERECTOMY  1989   One ovary, Per [facility] New Patient packet    ABLATION  1996   Radical, Per [facility] New Patient packet    BREAST CYST ASPIRATION Bilateral    numerous, over several years per pt, always benign   COLONOSCOPY  2019   Dr.Melissa Fayrene Fearing, Per [facility] New Patient packet    KNEE ARTHROSCOPY W/ MENISCAL REPAIR  2002   Per [facility] New Patient packet    MOUTH SURGERY     Dental Implant    REPLACEMENT TOTAL KNEE Left 2015   Per [facility] New Patient packet   RIGHT/LEFT HEART CATH AND CORONARY ANGIOGRAPHY N/A 02/07/2018   Procedure:  RIGHT/LEFT HEART CATH AND CORONARY ANGIOGRAPHY;  Surgeon: Swazil[place], Peter M, MD;  Location: [facility] INVASIVE CV LAB;  Service: Cardiovascular;  Laterality: N/A;   TONSILLECTOMY  1951   Per [facility] New Patient packet    TOOTH EXTRACTION  01/12/2019   TOOTH EXTRACTION  06/05/2019   Preparing for dental implant    Patient Active Problem List   Diagnosis Date Noted   Anticoagulant long-term use 05/04/2019   Chronic combined systolic and diastolic heart failure (HCC) 05/04/2019   Positive 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 09/15/2018   Pure hypercholesterolemia 06/14/2018   Coronary artery disease due to lipid rich plaque 03/02/2018   Systolic dysfunction without heart failure 02/02/2018   Nonischemic cardiomyopathy (HCC) 02/02/2018   Nonrheumatic mitral valve regurgitation 02/02/2018   Nonrheumatic tricuspid valve regurgitation 02/02/2018   Osteopenia 02/22/2017   Bilateral sensorineural hearing loss 01/28/2017   Vitamin D deficiency 01/21/2017   Osteoarthritis of knee 01/21/2017   Non-toxic multinodular goiter 01/21/2017   IFG (impaired fasting glucose) 01/21/2017   Lactose intolerance 01/21/2017   History of hysterectomy for benign disease 01/21/2017   Multiple actinic keratoses 01/21/2017   Normocytic anemia 01/21/2017   Umbilical hernia 01/21/2017   Essential hypertension 11/30/2016   Dyslipidemia 11/30/2016   Chronic atrial fibrillation (HCC) 11/30/2016   Obstructive sleep apnea syndrome 11/30/2016   Renal cyst 10/11/2012    PCP: [name], NP  REFERRING PROVIDER: [name], NP  REFERRING DIAG: [phone] (ICD-10-CM) - Acute pain of left knee M76.62 (ICD-10-CM) - Achilles tendinitis of left lower extremity   THERAPY DIAG:  Chronic pain of left knee  Pain of left heel  Muscle weakness (generalized)  Difficulty walking  ONSET DATE: July 2022- Knee; Achilles 2012  SUBJECTIVE:   SUBJECTIVE STATEMENT: Pt states the L Achilles pain has been a larger issue than the knee.  She states the Achilles pain has been going on for some time. She states the Achilles feels tight. Pt locates the pain to the posterior ankle and lateral ankle on L.  Pt limits her aquatic activity due to her A-fib so her heel pain never gets up very high. Pt denies previous L ankle injury. Pt describes the pain as dull. Pt mostly wears [employer] sneakers and clogs. Pt states she is limited with shopping due to heel pain. She avoids  going uphill due to heel pain and knee pain with uphill. Pt states she can touch the Achilles and it feels "thicker" on the L.   She states that she fell on the L side and twisted her knee back in July. States she did feel a pop but knee did not give way. She did note there was some bruising in the front the knee. TKA was done 9 years ago. States intense pain at first and it has since gotten much better. She states she currently does not have pain walking on that knee. She is currently taking water fitness classes twice a week.   Pt states denies cancer red flags. Pt denies NT. PERTINENT HISTORY: A-fib, CHF (pt report), R fibula fx, L knee TKA  PAIN:  Are you having pain? No Aggravating factors: walking long distances, hills, stairs, DF Relieving factors: wooden shoes with a heel,  PRECAUTIONS: N/A  WEIGHT BEARING RESTRICTIONS No  FALLS:  Has patient fallen in last 6 months? Yes, Number of falls: 1   LIVING ENVIRONMENT: Lives with: lives with their family Lives in: House/apartment   PLOF: Independent  PATIENT GOALS Pt states she would like get rid of  L ankle pain and knee pain. She would like to "feel stronger and have more stamina."    OBJECTIVE:   DIAGNOSTIC FINDINGS:   FINDINGS: Surgical changes of total knee arthroplasty without evidence of hardware complication. No knee joint effusion. No lytic or blastic osseous lesion. Soft tissues are unremarkable in appearance.   IMPRESSION: Surgical changes of total knee arthroplasty without evidence  of complication.  PATIENT SURVEYS:  LEFS Lower Extremity Functional Score: 42 / 80 = 52.5 %   SENSATION:  Light touch: Appears intact    MUSCLE LENGTH: L Positive Ely's (modified to S/L) L Positive Supine 90/90  POSTURE:  Kyphotic posture,   LE AROM/PROM:  A/PROM Right 10/13/2020 Left 10/13/2020  Knee flexion 125 120  Knee extension -5 3  Ankle dorsiflexion 0 -5  Ankle plantarflexion WFL WFL   (Blank rows = not tested)  LE MMT:  MMT Right 10/13/2020 Left 10/13/2020  Knee flexion 4+/5 4+/5  Knee extension 4+/5 4+/5  Ankle dorsiflexion 5/5 5/5  Ankle plantarflexion 4/5 4/5   (Blank rows = not tested)  LOWER EXTREMITY SPECIAL TESTS:  Knee special tests: Varus/valgus negative  Ankle special tests: Thompson's test: negative and Great toe extension test: positive   JOINT MOBILITY ASSESSMENT:  Limited posterior TC glide on L Palpation: TTP of L gastroc/soleus, proximal Achilles tendon medially, L anterior knee joint line, medial and lateral aspects   FUNCTIONAL TESTS:  Squat: quarter depth, limited bilat ankle DF  GAIT: Distance walked: 30 Assistive device utilized: None Level of assistance: Complete Independence Comments: decreased DF, toe out, early heel off, decreased L stance time, bilat excessive ankle pronation, bilat Trendelenburg  Stairs: R knee pain with descent, step to pattern, UE support 1 rail    TODAY'S TREATMENT:  S/L quad stretch 30s 3x Gastroc stretch at wall 30s 2x Standing HR with and without UE support 2x10  Cuing and modifications made due to pain and positional preferences. Signficant increase in pain with HR without UE support. Modified to counter/table height and discussed home set up for exercises    PATIENT EDUCATION:  Education details: MOI, diagnosis, prognosis, anatomy, exercise progression, DOMS expectations, muscle firing, acceptable levels of pain,  envelope of function, HEP, POC  Person educated: Patient Education  method: Explanation, Demonstration, Tactile cues, Verbal cues, and Handouts  Education comprehension: verbalized understanding and returned demonstration   HOME EXERCISE PROGRAM: Access Code: [email or web address] URL: [email or web address] Date: 10/13/2020 Prepared by: Zebedee Iba  Exercises Gastroc Stretch on Wall - 2 x daily - 7 x weekly - 1 sets - 3 reps - 30 hold Sidelying Quadriceps Stretch with Strap - 2 x daily - 7 x weekly - 1 sets - 3 reps - 30 hold Heel Raises with Counter Support - 2 x daily - 7 x weekly - 2 sets - 10 reps - 1 hold   ASSESSMENT:  CLINICAL IMPRESSION: Patient is a 75 y.o. female who was seen today for physical therapy evaluation and treatment for CC of L knee and L heel pain. Pt's s/s appear consistent with L Achilles tendinopathy and L anterior knee pain due to potential quad strain and superficial knee ligaments sprain. Clinical testing does not suggest excessive L knee laxity. L Achilles tendon pain is exacerbated by significant lack of ROM and triceps surae stiffness. Pt pain is moderately sensitive and irritable with loading. Objective impairments include Abnormal gait, decreased activity tolerance, decreased balance, decreased endurance, decreased mobility, difficulty walking, decreased ROM, decreased strength, hypomobility, increased edema, impaired flexibility, improper body mechanics, obesity, and pain. These impairments are limiting patient from community activity, yard work, and shopping. Personal factors including Age, Fitness, Time since onset of injury/illness/exacerbation, and 1-2 comorbidities:    are also affecting patient's functional outcome. Patient will benefit from skilled PT to address above impairments and improve overall function.  REHAB POTENTIAL: Good  CLINICAL DECISION MAKING: Stable/uncomplicated  EVALUATION COMPLEXITY: Low   GOALS:   SHORT TERM GOALS:  STG Name Target Date Goal status  1 Pt will become independent with HEP in  order to demonstrate synthesis of PT education.  Baseline:  10/27/2020 INITIAL  2 Pt will be able to demonstrate ability to perform 10 HR without pain in order to demonstrate functional improvement in LE function for gait and house hold duties.  Baseline:  11/10/2020 INITIAL  3 Pt will have an at least 9 pt improvement in LEFS measure in order to demonstrate [facility] improvement in daily function.  Baseline: 11/10/2020 INITIAL   LONG TERM GOALS:   LTG Name Target Date Goal status  1 Pt  will become independent with final HEP in order to demonstrate synthesis of PT education.  Baseline: 12/08/2020 INITIAL  2 Pt will be able to demonstrate/report ability to walk >20 mins without pain in order to demonstrate functional improvement and tolerance to exercise and community mobility.  Baseline: 12/08/2020 INITIAL  3 Pt will be able to demonstrate 5/5 MMT with L ankle PF and L knee extension in order to demonstrate functional improvement in LE function for daily mobility and house hold duties.  Baseline: 12/08/2020 INITIAL  4 Pt will have an at least 18 pt improvement in LEFS measure in order to demonstrate MCID improvement in daily function.  Baseline: 12/08/2020 INITIAL   PLAN: PT FREQUENCY: 1-2x/week  PT DURATION: 8 weeks  PLANNED INTERVENTIONS: Therapeutic exercises, Therapeutic activity, Neuro Muscular re-education, Balance training, Gait training, Patient/Family education, Joint mobilization, Stair training, Orthotic/Fit training, Aquatic Therapy, Dry Needling, Electrical stimulation, Spinal mobilization, Cryotherapy, Moist heat, scar mobilization, Taping, Vasopneumatic device, Traction, Ultrasound, Ionotophoresis 4mg /ml Dexamethasone, and Manual therapy  PLAN FOR NEXT SESSION: review HEP, trial eccentrics with HR, knee extensions, sidestepping  PT, DPT 10/13/20 6:00 PM

## 2020-10-20 ENCOUNTER — Encounter (HOSPITAL_BASED_OUTPATIENT_CLINIC_OR_DEPARTMENT_OTHER): Payer: Self-pay | Admitting: Physical Therapy

## 2020-10-20 ENCOUNTER — Ambulatory Visit (HOSPITAL_BASED_OUTPATIENT_CLINIC_OR_DEPARTMENT_OTHER): Payer: Medicare Other | Admitting: Physical Therapy

## 2020-10-20 ENCOUNTER — Other Ambulatory Visit: Payer: Self-pay

## 2020-10-20 DIAGNOSIS — M25562 Pain in left knee: Secondary | ICD-10-CM | POA: Diagnosis not present

## 2020-10-20 DIAGNOSIS — M79672 Pain in left foot: Secondary | ICD-10-CM

## 2020-10-20 DIAGNOSIS — M6281 Muscle weakness (generalized): Secondary | ICD-10-CM

## 2020-10-20 DIAGNOSIS — G8929 Other chronic pain: Secondary | ICD-10-CM

## 2020-10-20 DIAGNOSIS — M7662 Achilles tendinitis, left leg: Secondary | ICD-10-CM | POA: Diagnosis not present

## 2020-10-20 DIAGNOSIS — R262 Difficulty in walking, not elsewhere classified: Secondary | ICD-10-CM

## 2020-10-20 NOTE — Therapy (Signed)
OUTPATIENT PHYSICAL THERAPY TREATMENT NOTE   Patient Name: Jennifer Mcclain MRN: [id number] DOB:1945/10/13, 75 y.o., female Today's Date: 10/20/2020  PCP: [name], NP REFERRING PROVIDER: [name], NP   PT End of Session - 10/20/20 1655     Visit Number 2    Number of Visits 17    Date for PT Re-Evaluation 01/11/21    Authorization Type Medicare    PT Start Time 1515    PT Stop Time 1555    PT Time Calculation (min) 40 min    Activity Tolerance Patient tolerated treatment well;Patient limited by pain    Behavior During Therapy [facility] for tasks assessed/performed             Past Medical History:  Diagnosis Date   Achilles tendinitis    Per [facility] New Patient packet    Atrial fibrillation [facility])    Per records from [facility] in [place], [place]    Baker's cyst of knee, left    with resulting lower extremity edema   Benign cyst of breast, left    Per records from [facility] in [place], [place]. 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 Per records from [facility] in [place], [place]    H/O calculus of kidney during pregnancy    Lynden Ang, MD   H/O hysterectomy for benign disease    Lynden Ang, MD   History of fibula fracture    Right, Per records from [facility] in [place], [place]    History of torn meniscus of right knee    Per records from [facility] in [place], [place]    Humerus fracture    left leg- due to fall   Humerus fracture    Resulting from a fall, Per records from [facility] in [place], [place]    Hypercalcemia    Per records from [facility] in [place], [place]    Hypertension    Per [facility] New Patient packet    Kidney stone on left side    Per records from [facility] in [place], [place]    Lactose intolerance    Per [facility] New Patient packet    Multiple actinic keratoses    Lynden Ang, MD   Myalgia due to statin    Per records from [facility] in [place], [place]. Due to crestor    Non-toxic multinodular goiter 01/21/2017   Lynden Ang, MD   Normocytic anemia    Lynden Ang, MD   Obstructive sleep apnea syndrome    Per records from [facility] in [place], [place]. Patient did not tolerate CPAP machine    Onychomycosis    Per records from [facility] in [place], [place]    Osteoarthritis    Per [facility]  New Patient packet    Osteopenia    Per records from [facility] in [place], [place]. Treated in the past with antiresorptive therapy   Renal cyst 10/11/2012   Simple on CT Scan 10/2012. Per records from [facility] in [place], [place]    Sensorineural hearing loss (SNHL), bilateral 01/28/2017   Lynden Ang , MD   Umbilical hernia    Per [facility] New Patient packet    Vitamin D deficiency    Lynden Ang, MD   Past Surgical History:  Procedure Laterality Date   ABDOMINAL HYSTERECTOMY  1989   One ovary, Per [facility] New Patient packet    ABLATION  1996   Radical, Per [facility] New Patient packet    BREAST CYST ASPIRATION Bilateral    numerous, over several years per pt, always benign   COLONOSCOPY  2019   Dr.Melissa Fayrene Fearing, Per [facility] New Patient packet    KNEE ARTHROSCOPY W/ MENISCAL REPAIR  2002   Per [facility] New Patient packet    MOUTH SURGERY     Dental Implant    REPLACEMENT TOTAL KNEE Left 2015   Per [facility] New Patient packet   RIGHT/LEFT  HEART CATH AND CORONARY ANGIOGRAPHY N/A 02/07/2018   Procedure: RIGHT/LEFT HEART CATH AND CORONARY ANGIOGRAPHY;  Surgeon: Swaziland, Peter M, MD;  [facility];  Service: Cardiovascular;  Laterality: N/A;   TONSILLECTOMY  1951   Per [facility] New Patient packet    TOOTH EXTRACTION  01/12/2019   TOOTH EXTRACTION  06/05/2019   Preparing for dental implant    Patient Active Problem List   Diagnosis Date Noted   Anticoagulant long-term use 05/04/2019   Chronic combined systolic and diastolic heart failure (HCC) 05/04/2019   Positive 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 09/15/2018   Pure hypercholesterolemia 06/14/2018   Coronary artery disease due to lipid rich plaque 03/02/2018   Systolic dysfunction without heart failure 02/02/2018   Nonischemic cardiomyopathy (HCC) 02/02/2018   Nonrheumatic mitral valve regurgitation 02/02/2018   Nonrheumatic tricuspid valve regurgitation 02/02/2018   Osteopenia 02/22/2017   Bilateral sensorineural hearing loss 01/28/2017   Vitamin D deficiency 01/21/2017   Osteoarthritis of knee 01/21/2017   Non-toxic multinodular goiter 01/21/2017   IFG (impaired fasting glucose) 01/21/2017   Lactose intolerance 01/21/2017   History of hysterectomy for benign disease 01/21/2017   Multiple actinic keratoses 01/21/2017   Normocytic anemia 01/21/2017   Umbilical hernia 01/21/2017   Essential hypertension 11/30/2016   Dyslipidemia 11/30/2016   Chronic atrial fibrillation (HCC) 11/30/2016   Obstructive sleep apnea syndrome 11/30/2016   Renal cyst 10/11/2012    REFERRING DIAG: M25.562 (ICD-10-CM) - Acute pain of left knee M76.62 (ICD-10-CM) - Achilles tendinitis of left lower extremity   THERAPY DIAG:  Chronic pain of left knee  Pain of left heel  Muscle weakness (generalized)  Difficulty walking  PERTINENT HISTORY: A-fib, CHF (pt report), R fibula fx, L knee TKA  FINDINGS: Surgical changes of total knee arthroplasty without evidence of hardware  complication. No knee joint effusion. No lytic or blastic osseous lesion. Soft tissues are unremarkable in appearance.   IMPRESSION: Surgical changes of total knee arthroplasty without evidence of complication.    PRECAUTIONS: N/A  SUBJECTIVE: Pt states she is a little more sore in the Achilles since last session. She states the stretching makes it most sore.   PAIN:  Are you having pain? Yes VAS scale: 1/10 Pain location: L knee  Pain orientation: Anterior  PAIN TYPE: dull ache   OBJECTIVE:  TODAY'S TREATMENT: 10/10 STM L lateral gastroc Joint mob: L TCJ post glide grade III  S/L quad stretch 30s 3x Gastroc stretch at wall 30s 2x Standing HR with and without UE support 2x10 Woodpecker 3s hold 2x10 Review of footwear and shoe recommendations- stiff toe break, heel lift, mild medial arch support    10/3  S/L quad stretch 30s 3x Gastroc stretch at wall 30s 2x Standing HR with and without UE support 2x10   Cuing and modifications made due to pain and positional preferences. Signficant increase in pain with HR without UE support. Modified to counter/table height and discussed home set up for exercises       PATIENT EDUCATION:  Education details: footwear, anatomy, exercise progression, DOMS expectations, muscle firing, acceptable levels of pain,  envelope of function, HEP   Person educated: Patient Education method: Explanation, Demonstration, Tactile cues, Verbal cues, and Handouts Education comprehension: verbalized understanding and returned demonstration     HOME EXERCISE PROGRAM: Access Code: [email or web address] URL: [email or web address] Date: 10/13/2020 Prepared by: Zebedee Iba   Exercises Gastroc Stretch on Wall - 2 x daily - 7 x weekly - 1 sets - 3 reps - 30 hold Sidelying Quadriceps Stretch with Strap - 2 x daily - 7 x weekly - 1 sets - 3 reps - 30 hold Heel Raises with Counter Support - 2 x daily - 7 x weekly - 2 sets - 10 reps - 1 hold      ASSESSMENT:   CLINICAL IMPRESSION: Pt had improve soft tissue extensibility and report of improve pain and stiffness following manual. Pt required VC and TC today for review of HEP and performance of stretching exercise to reduce ankle eversion compensations. Pt able to in corporate isometric exercise today with woodpeckers without pain. Heel lift trialed in shoes and pt shoes examined to determine pt most likely will benefit from a shoe/insert with mild heel lift and to avoid zero drops shoes. Pt given small heel lift to trial walking and report back at next session to determine need for insert for temporary offloading of L Achilles. Recommendation to [place] feet provided provisionally. Plan to review at next session. Pt would benefit from continued skilled therapy in order to reach goals and maximize functional L LE strength and ROM for full return to PLOF.    REHAB POTENTIAL: Good   CLINICAL DECISION MAKING: Stable/uncomplicated   EVALUATION COMPLEXITY: Low     GOALS:     SHORT TERM GOALS:   STG Name Target Date Goal status  1 Pt will become independent with HEP in order to demonstrate synthesis of PT education.   Baseline:  10/27/2020 INITIAL  2 Pt will be able to demonstrate ability to perform 10 HR without pain in order to demonstrate functional improvement in LE function for gait and house hold duties.   Baseline:  11/10/2020 INITIAL  3 Pt will have an at least 9 pt improvement in LEFS measure in order to demonstrate MCID improvement in daily function.   Baseline: 11/10/2020 INITIAL    LONG TERM GOALS:    LTG Name Target Date Goal status  1 Pt  will become independent with final HEP in order to demonstrate synthesis of PT education.   Baseline: 12/08/2020 INITIAL  2 Pt will be able to demonstrate/report ability to walk >20 mins without pain in order to demonstrate functional improvement and tolerance to exercise and community mobility.   Baseline: 12/08/2020 INITIAL  3  Pt will be able to demonstrate 5/5 MMT with  L ankle PF and L knee extension in order to demonstrate functional improvement in LE function for daily mobility and house hold duties.  Baseline: 12/08/2020 INITIAL  4 Pt will have an at least 18 pt improvement in LEFS measure in order to demonstrate MCID improvement in daily function.  Baseline: 12/08/2020 INITIAL    PLAN: PT FREQUENCY: 1-2x/week   PT DURATION: 8 weeks   PLANNED INTERVENTIONS: Therapeutic exercises, Therapeutic activity, Neuro Muscular re-education, Balance training, Gait training, Patient/Family education, Joint mobilization, Stair training, Orthotic/Fit training, Aquatic Therapy, Dry Needling, Electrical stimulation, Spinal mobilization, Cryotherapy, Moist heat, scar mobilization, Taping, Vasopneumatic device, Traction, Ultrasound, Ionotophoresis 4mg /ml Dexamethasone, and Manual therapy   PLAN FOR NEXT SESSION: review HEP, knee extensions, sidestepping, ankle 4 way    PT, DPT 10/20/20 4:55 PM

## 2020-10-21 ENCOUNTER — Other Ambulatory Visit: Payer: Self-pay | Admitting: Cardiology

## 2020-10-21 NOTE — Telephone Encounter (Signed)
Rx(s) sent to pharmacy electronically.  

## 2020-10-28 ENCOUNTER — Encounter (HOSPITAL_BASED_OUTPATIENT_CLINIC_OR_DEPARTMENT_OTHER): Payer: Self-pay | Admitting: Physical Therapy

## 2020-10-28 ENCOUNTER — Other Ambulatory Visit: Payer: Self-pay

## 2020-10-28 ENCOUNTER — Ambulatory Visit (HOSPITAL_BASED_OUTPATIENT_CLINIC_OR_DEPARTMENT_OTHER): Payer: Medicare Other | Admitting: Physical Therapy

## 2020-10-28 DIAGNOSIS — M7662 Achilles tendinitis, left leg: Secondary | ICD-10-CM | POA: Diagnosis not present

## 2020-10-28 DIAGNOSIS — M25562 Pain in left knee: Secondary | ICD-10-CM | POA: Diagnosis not present

## 2020-10-28 DIAGNOSIS — M79672 Pain in left foot: Secondary | ICD-10-CM

## 2020-10-28 DIAGNOSIS — M6281 Muscle weakness (generalized): Secondary | ICD-10-CM

## 2020-10-28 DIAGNOSIS — R262 Difficulty in walking, not elsewhere classified: Secondary | ICD-10-CM

## 2020-10-28 DIAGNOSIS — G8929 Other chronic pain: Secondary | ICD-10-CM

## 2020-10-28 NOTE — Therapy (Signed)
OUTPATIENT PHYSICAL THERAPY TREATMENT NOTE   Patient Name: Jennifer Mcclain MRN: [id number] DOB:April 26, 1945, 75 y.o., female Today's Date: 10/28/2020  PCP: [name], NP REFERRING PROVIDER: [name], NP   PT End of Session - 10/28/20 1558     Visit Number 3    Number of Visits 17    Date for PT Re-Evaluation 01/11/21    Authorization Type Medicare    PT Start Time 1600    PT Stop Time 1645    PT Time Calculation (min) 45 min    Activity Tolerance Patient tolerated treatment well;Patient limited by pain    Behavior During Therapy [facility] for tasks assessed/performed              Past Medical History:  Diagnosis Date   Achilles tendinitis    Per [facility] New Patient packet    Atrial fibrillation [facility])    Per records from [facility] in [place], [place]    Baker's cyst of knee, left    with resulting lower extremity edema   Benign cyst of breast, left    Per records from [facility] in [place], [place]. Has yearly U/S in the past    Cerebrovascular disease    Per [facility] New Patient packet    Chronic atrial fibrillation (HCC)    Per [facility] New Patient packet    Chronic atrial fibrillation (HCC)    [name], MD   Decreased hearing, bilateral    referred to audiologist, Per records from [facility] in [place], [place]    Disorder of bone density and structure, unspecified    Per records from [facility] in [place], [place]    Disorder of carotid artery [facility])    Lynden Ang, MD   Dyslipidemia    [name], MD   Elevated alkaline phosphatase level    Neg ative ANA 08/2013. Per records from [facility] in [place], [place]    H/O calculus of kidney during pregnancy    Lynden Ang, MD   H/O hysterectomy for benign disease    Lynden Ang, MD   History of fibula fracture    Right, Per records from [facility] in [place], [place]    History of torn meniscus of right knee    Per records from [facility] in [place], [place]    Humerus fracture    left leg- due to fall   Humerus fracture    Resulting from a fall, Per records from [facility] in [place], [place]    Hypercalcemia    Per records from [facility] in [place], [place]    Hypertension    Per [facility] New Patient packet    Kidney stone on left side    Per records from [facility] in [place], [place]    Lactose intolerance    Per [facility] New Patient packet    Multiple actinic keratoses    Lynden Ang, MD   Myalgia due to statin    Per records from [facility] in [place], [place]. Due to crestor    Non-toxic multinodular goiter 01/21/2017   Lynden Ang, MD   Normocytic anemia    Lynden Ang, MD   Obstructive sleep apnea syndrome    Per records from [facility] in [place], [place]. Patient did not tolerate CPAP machine    Onychomycosis    Per records from [facility] in [place], [place]    Osteoarthritis    Per  [facility] New Patient packet    Osteopenia    Per records from [facility] in [place], [place]. Treated in the past with antiresorptive therapy   Renal cyst 10/11/2012   Simple on CT Scan 10/2012. Per records from [facility] in [place], [place]    Sensorineural hearing loss (SNHL), bilateral 01/28/2017   Lynden Ang , MD   Umbilical hernia    Per [facility] New Patient packet    Vitamin D deficiency    Lynden Ang, MD   Past Surgical History:  Procedure Laterality Date   ABDOMINAL HYSTERECTOMY  1989   One ovary, Per [facility] New Patient packet    ABLATION  1996   Radical, Per [facility] New Patient packet    BREAST CYST ASPIRATION Bilateral    numerous, over several years per pt, always benign   COLONOSCOPY  2019   Dr.Melissa Fayrene Fearing, Per [facility] New Patient packet    KNEE ARTHROSCOPY W/ MENISCAL REPAIR  2002   Per [facility] New Patient packet    MOUTH SURGERY     Dental Implant    REPLACEMENT TOTAL KNEE Left 2015   Per [facility] New Patient packet   RIGHT/LEFT  HEART CATH AND CORONARY ANGIOGRAPHY N/A 02/07/2018   Procedure: RIGHT/LEFT HEART CATH AND CORONARY ANGIOGRAPHY;  Surgeon: Swaziland, Peter M, MD;  [facility];  Service: Cardiovascular;  Laterality: N/A;   TONSILLECTOMY  1951   Per [facility] New Patient packet    TOOTH EXTRACTION  01/12/2019   TOOTH EXTRACTION  06/05/2019   Preparing for dental implant    Patient Active Problem List   Diagnosis Date Noted   Anticoagulant long-term use 05/04/2019   Chronic combined systolic and diastolic heart failure (HCC) 05/04/2019   Positive 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 09/15/2018   Pure hypercholesterolemia 06/14/2018   Coronary artery disease due to lipid rich plaque 03/02/2018   Systolic dysfunction without heart failure 02/02/2018   Nonischemic cardiomyopathy (HCC) 02/02/2018   Nonrheumatic mitral valve regurgitation 02/02/2018   Nonrheumatic tricuspid valve regurgitation 02/02/2018   Osteopenia 02/22/2017   Bilateral sensorineural hearing loss 01/28/2017   Vitamin D deficiency 01/21/2017   Osteoarthritis of knee 01/21/2017   Non-toxic multinodular goiter 01/21/2017   IFG (impaired fasting glucose) 01/21/2017   Lactose intolerance 01/21/2017   History of hysterectomy for benign disease 01/21/2017   Multiple actinic keratoses 01/21/2017   Normocytic anemia 01/21/2017   Umbilical hernia 01/21/2017   Essential hypertension 11/30/2016   Dyslipidemia 11/30/2016   Chronic atrial fibrillation (HCC) 11/30/2016   Obstructive sleep apnea syndrome 11/30/2016   Renal cyst 10/11/2012    REFERRING DIAG: M25.562 (ICD-10-CM) - Acute pain of left knee M76.62 (ICD-10-CM) - Achilles tendinitis of left lower extremity   THERAPY DIAG:  Chronic pain of left knee  Pain of left heel  Muscle weakness (generalized)  Difficulty walking  PERTINENT HISTORY: A-fib, CHF (pt report), R fibula fx, L knee TKA  FINDINGS: Surgical changes of total knee arthroplasty without evidence of hardware  complication. No knee joint effusion. No lytic or blastic osseous lesion. Soft tissues are unremarkable in appearance.   IMPRESSION: Surgical changes of total knee arthroplasty without evidence of complication.    PRECAUTIONS: N/A  SUBJECTIVE: Pt states that the R ankle was more swollen last night. She is unsure of what could have caused it. She does not have pain with HEP. She did get new Brooks shoes, but does not notice an immediate difference but does not notice as high of pain.  PAIN:  Are you having pain? Yes VAS scale: 1/10 Pain location: L knee  Pain orientation: Anterior  PAIN TYPE: dull ache   OBJECTIVE:   TODAY'S TREATMENT:  10/18 STM bilat lateral gastroc, post tib  Standing  stretch 30s 3x Gastroc stretch at slantboard 30s 2x Standing HR with and without UE support 2x10 Woodpecker 3s hold 2x10 LAQ 5lbs R 10lbs Seated knee extension machine 10x 15lbs double leg and review of how to set up in gym setting   10/10 STM L lateral gastroc Joint mob: L TCJ post glide grade III  S/L quad stretch 30s 3x Gastroc stretch at wall 30s 2x Standing HR with and without UE support 2x10 Woodpecker 3s hold 2x10 Review of footwear and shoe recommendations- stiff toe break, heel lift, mild medial arch support        PATIENT EDUCATION:  Education details: anatomy, exercise progression, DOMS expectations, muscle firing, acceptable levels of pain,  envelope of function, HEP   Person educated: Patient Education method: Explanation, Demonstration, Tactile cues, Verbal cues, and Handouts Education comprehension: verbalized understanding and returned demonstration     HOME EXERCISE PROGRAM: Access Code: [email or web address] URL: [email or web address] Date: 10/28/2020 Prepared by: Zebedee Iba  Exercises Heel Raises with Counter Support - 1 x daily - 7 x weekly - 2 sets - 10 reps - 1 hold Woodpeckers Two Legs - 1 x daily - 7 x weekly - 1 sets - 15 reps - 3 hold Slant  Board Gastrocnemius Stretch - 2 x daily - 7 x weekly - 1 sets - 3 reps - 30 hold Standing Quad Stretch with Strap - 2 x daily - 7 x weekly - 1 sets - 3 reps - 30 hold Seated Long Arc Quad with Ankle Weight - 1 x daily - 7 x weekly - 2 sets - 10 reps Calf Mobilization with Small Ball - 1 x daily - 7 x weekly - 1 sets - 1 reps - 5 min hold       ASSESSMENT:   CLINICAL IMPRESSION: Pt with improved tolerance to calf and knee loading at today's session though does not notice improvement with overall pain. Pt did present with increased R medial ankle swelling at today's session. Palpation elicited twitch response in bilat gastroc soleus as well as posterior tibialis tenderness. Pt given edu about self massage technique and progression/expectations with tendinopathy related injuries. Pt does not appears to still have more DF restriction vs excessive pronation with gait. Plan to continue with triceps surae and R quad strength as tolerated. Pt would benefit from continued skilled therapy in order to reach goals and maximize functional L LE strength and ROM for full return to PLOF.    REHAB POTENTIAL: Good   CLINICAL DECISION MAKING: Stable/uncomplicated   EVALUATION COMPLEXITY: Low     GOALS:     SHORT TERM GOALS:   STG Name Target Date Goal status  1 Pt will become independent with HEP in order to demonstrate synthesis of PT education.   Baseline:  10/27/2020 INITIAL  2 Pt will be able to demonstrate ability to perform 10 HR without pain in order to demonstrate functional improvement in LE function for gait and house hold duties.   Baseline:  11/10/2020 INITIAL  3 Pt will have an at least 9 pt improvement in LEFS measure in order to demonstrate MCID improvement in daily function.   Baseline: 11/10/2020 INITIAL    LONG TERM GOALS:    LTG Name Target Date Goal status  1  Pt  will become independent with final HEP in order to demonstrate synthesis of PT education.   Baseline: 12/08/2020  INITIAL  2 Pt will be able to demonstrate/report ability to walk >20 mins without pain in order to demonstrate functional improvement and tolerance to exercise and community mobility.   Baseline: 12/08/2020 INITIAL  3 Pt will be able to demonstrate 5/5 MMT with L ankle PF and L knee extension in order to demonstrate functional improvement in LE function for daily mobility and house hold duties.  Baseline: 12/08/2020 INITIAL  4 Pt will have an at least 18 pt improvement in LEFS measure in order to demonstrate MCID improvement in daily function.  Baseline: 12/08/2020 INITIAL    PLAN: PT FREQUENCY: 1-2x/week   PT DURATION: 8 weeks   PLANNED INTERVENTIONS: Therapeutic exercises, Therapeutic activity, Neuro Muscular re-education, Balance training, Gait training, Patient/Family education, Joint mobilization, Stair training, Orthotic/Fit training, Aquatic Therapy, Dry Needling, Electrical stimulation, Spinal mobilization, Cryotherapy, Moist heat, scar mobilization, Taping, Vasopneumatic device, Traction, Ultrasound, Ionotophoresis 4mg /ml Dexamethasone, and Manual therapy   PLAN FOR NEXT SESSION: review HEP, calf raise with ball between heel, foam balance, sidestepping, ankle 4 way    PT, DPT 10/28/20 5:02 PM

## 2020-11-03 ENCOUNTER — Encounter (HOSPITAL_BASED_OUTPATIENT_CLINIC_OR_DEPARTMENT_OTHER): Payer: Self-pay | Admitting: Physical Therapy

## 2020-11-03 ENCOUNTER — Other Ambulatory Visit: Payer: Self-pay

## 2020-11-03 ENCOUNTER — Encounter (HOSPITAL_BASED_OUTPATIENT_CLINIC_OR_DEPARTMENT_OTHER): Payer: Medicare Other | Attending: Nurse Practitioner | Admitting: Physical Therapy

## 2020-11-03 DIAGNOSIS — M25562 Pain in left knee: Secondary | ICD-10-CM | POA: Diagnosis not present

## 2020-11-03 DIAGNOSIS — M79672 Pain in left foot: Secondary | ICD-10-CM | POA: Diagnosis not present

## 2020-11-03 DIAGNOSIS — G8929 Other chronic pain: Secondary | ICD-10-CM | POA: Diagnosis not present

## 2020-11-03 DIAGNOSIS — M6281 Muscle weakness (generalized): Secondary | ICD-10-CM | POA: Diagnosis not present

## 2020-11-03 DIAGNOSIS — R262 Difficulty in walking, not elsewhere classified: Secondary | ICD-10-CM | POA: Insufficient documentation

## 2020-11-03 NOTE — Therapy (Signed)
OUTPATIENT PHYSICAL THERAPY TREATMENT NOTE   Patient Name: Jennifer Mcclain MRN: [id number] DOB:1945/06/08, 75 y.o., female Today's Date: 11/03/2020  PCP: [name], NP REFERRING PROVIDER: [name], NP   PT End of Session - 11/03/20 1523     Visit Number 4    Number of Visits 17    Date for PT Re-Evaluation 01/11/21    Authorization Type Medicare    PT Start Time 1520    PT Stop Time 1600    PT Time Calculation (min) 40 min    Activity Tolerance Patient tolerated treatment well;Patient limited by pain    Behavior During Therapy [facility] for tasks assessed/performed              Past Medical History:  Diagnosis Date   Achilles tendinitis    Per [facility] New Patient packet    Atrial fibrillation [facility])    Per records from [facility] in [place], [place]    Baker's cyst of knee, left    with resulting lower extremity edema   Benign cyst of breast, left    Per records from [facility] in [place], [place]. Has yearly U/S in the past    Cerebrovascular disease    Per [facility] New Patient packet    Chronic atrial fibrillation (HCC)    Per [facility] New Patient packet    Chronic atrial fibrillation (HCC)    [name], MD   Decreased hearing, bilateral    referred to audiologist, Per records from [facility] in [place], [place]    Disorder of bone density and structure, unspecified    Per records from [facility] in [place], [place]    Disorder of carotid artery [facility])    Lynden Ang, MD   Dyslipidemia    [name], MD   Elevated alkaline phosphatase level    Neg ative ANA 08/2013. Per records from [facility] in [place], [place]    H/O calculus of kidney during pregnancy    Lynden Ang, MD   H/O hysterectomy for benign disease    Lynden Ang, MD   History of fibula fracture    Right, Per records from [facility] in [place], [place]    History of torn meniscus of right knee    Per records from [facility] in [place], [place]    Humerus fracture    left leg- due to fall   Humerus fracture    Resulting from a fall, Per records from [facility] in [place], [place]    Hypercalcemia    Per records from [facility] in [place], [place]    Hypertension    Per [facility] New Patient packet    Kidney stone on left side    Per records from [facility] in [place], [place]    Lactose intolerance    Per [facility] New Patient packet    Multiple actinic keratoses    Lynden Ang, MD   Myalgia due to statin    Per records from [facility] in [place], [place]. Due to crestor    Non-toxic multinodular goiter 01/21/2017   Lynden Ang, MD   Normocytic anemia    Lynden Ang, MD   Obstructive sleep apnea syndrome    Per records from [facility] in [place], [place]. Patient did not tolerate CPAP machine    Onychomycosis    Per records from [facility] in [place], [place]    Osteoarthritis    Per  [facility] New Patient packet    Osteopenia    Per records from [facility] in [place], [place]. Treated in the past with antiresorptive therapy   Renal cyst 10/11/2012   Simple on CT Scan 10/2012. Per records from [facility] in [place], [place]    Sensorineural hearing loss (SNHL), bilateral 01/28/2017   Lynden Ang , MD   Umbilical hernia    Per [facility] New Patient packet    Vitamin D deficiency    Lynden Ang, MD   Past Surgical History:  Procedure Laterality Date   ABDOMINAL HYSTERECTOMY  1989   One ovary, Per [facility] New Patient packet    ABLATION  1996   Radical, Per [facility] New Patient packet    BREAST CYST ASPIRATION Bilateral    numerous, over several years per pt, always benign   COLONOSCOPY  2019   Dr.Melissa Fayrene Fearing, Per [facility] New Patient packet    KNEE ARTHROSCOPY W/ MENISCAL REPAIR  2002   Per [facility] New Patient packet    MOUTH SURGERY     Dental Implant    REPLACEMENT TOTAL KNEE Left 2015   Per [facility] New Patient packet   RIGHT/LEFT  HEART CATH AND CORONARY ANGIOGRAPHY N/A 02/07/2018   Procedure: RIGHT/LEFT HEART CATH AND CORONARY ANGIOGRAPHY;  Surgeon: [place], Peter M, MD;  Location: [facility] INVASIVE CV LAB;  Service: Cardiovascular;  Laterality: N/A;   TONSILLECTOMY  1951   Per [facility] New Patient packet    TOOTH EXTRACTION  01/12/2019   TOOTH EXTRACTION  06/05/2019   Preparing for dental implant    Patient Active Problem List   Diagnosis Date Noted   Anticoagulant long-term use 05/04/2019   Chronic combined systolic and diastolic heart failure (HCC) 05/04/2019   Positive 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 09/15/2018   Pure hypercholesterolemia 06/14/2018   Coronary artery disease due to lipid rich plaque 03/02/2018   Systolic dysfunction without heart failure 02/02/2018   Nonischemic cardiomyopathy (HCC) 02/02/2018   Nonrheumatic mitral valve regurgitation 02/02/2018   Nonrheumatic tricuspid valve regurgitation 02/02/2018   Osteopenia 02/22/2017   Bilateral sensorineural hearing loss 01/28/2017   Vitamin D deficiency 01/21/2017   Osteoarthritis of knee 01/21/2017   Non-toxic multinodular goiter 01/21/2017   IFG (impaired fasting glucose) 01/21/2017   Lactose intolerance 01/21/2017   History of hysterectomy for benign disease 01/21/2017   Multiple actinic keratoses 01/21/2017   Normocytic anemia 01/21/2017   Umbilical hernia 01/21/2017   Essential hypertension 11/30/2016   Dyslipidemia 11/30/2016   Chronic atrial fibrillation (HCC) 11/30/2016   Obstructive sleep apnea syndrome 11/30/2016   Renal cyst 10/11/2012    REFERRING DIAG: M25.562 (ICD-10-CM) - Acute pain of left knee M76.62 (ICD-10-CM) - Achilles tendinitis of left lower extremity   THERAPY DIAG:  Chronic pain of left knee  Pain of left heel  Muscle weakness (generalized)  Difficulty walking  PERTINENT HISTORY: A-fib, CHF (pt report), R fibula fx, L knee TKA  FINDINGS: Surgical changes of total knee arthroplasty without evidence of hardware  complication. No knee joint effusion. No lytic or blastic osseous lesion. Soft tissues are unremarkable in appearance.   IMPRESSION: Surgical changes of total knee arthroplasty without evidence of complication.    PRECAUTIONS: N/A  SUBJECTIVE: Pt states that she feels like the heel pain is getting worse and the balance is getting worse. But she is doing more than usual compared to before. She does enjoy the the LAQ from the HEP. Pt states is doing the "Less pain with Erskine Squibb" class and chair  yoga.   PAIN:  Are you having pain? No VAS scale: 0/10 Pain location: L knee  Pain orientation: Anterior  PAIN TYPE: dull ache   OBJECTIVE:   TODAY'S TREATMENT:   102/24  Nu-step L2 4.5 min Leg press calf raise 25lbs 2x10  Standing HR with and without UE support 2x10 Woodpecker 3s hold 2x10 LAQ 5lbs bilat 2x10 each side  10/18 STM bilat lateral gastroc, post tib  Standing  stretch 30s 3x Gastroc stretch at slantboard 30s 2x Standing HR with and without UE support 2x10 Woodpecker 3s hold 2x10 LAQ 5lbs R 10lbs Seated knee extension machine 10x 15lbs double leg and review of how to set up in gym setting      PATIENT EDUCATION:  Education details: anatomy, exercise progression, DOMS expectations, muscle firing, acceptable levels of pain,  envelope of function, HEP, POC   Person educated: Patient Education method: Explanation, Demonstration, Tactile cues, Verbal cues, and Handouts Education comprehension: verbalized understanding and returned demonstration     HOME EXERCISE PROGRAM: Access Code: [email or web address] URL: [email or web address] Date: 10/28/2020 Prepared by: Zebedee Iba  Exercises Heel Raises with Counter Support - 1 x daily - 7 x weekly - 2 sets - 10 reps - 1 hold Woodpeckers Two Legs - 1 x daily - 7 x weekly - 1 sets - 15 reps - 3 hold Slant Board Gastrocnemius Stretch - 2 x daily - 7 x weekly - 1 sets - 3 reps - 30 hold Standing Quad Stretch with Strap - 2  x daily - 7 x weekly - 1 sets - 3 reps - 30 hold Seated Long Arc Quad with Ankle Weight - 1 x daily - 7 x weekly - 2 sets - 10 reps Calf Mobilization with Small Ball - 1 x daily - 7 x weekly - 1 sets - 1 reps - 5 min hold       ASSESSMENT:   CLINICAL IMPRESSION: Due to pt's report of increased pain and worsening balance with HEP, pt to transition to aquatic therapy to trial use of reduced gravity setting for LE strengthening. Should pain continue to worsen, pt will likely need referral to Orthopedic/MD in order to assess for further medical intervention. At present, pt's complaint of increased pain with modified exercise does not align with expected PT trajectory. However, due to pt's report of increased/new activity, there is a chance new pain is related to increase in [employer]/gym related activity/classes. Plan to intro to aquatics at next session and trial gentle ROM and calf & knee strength to tolerance. Pt would benefit from continued skilled therapy in order to reach goals and maximize functional L LE strength and ROM for full return to PLOF.    REHAB POTENTIAL: Good   CLINICAL DECISION MAKING: Stable/uncomplicated   EVALUATION COMPLEXITY: Low     GOALS:     SHORT TERM GOALS:   STG Name Target Date Goal status  1 Pt will become independent with HEP in order to demonstrate synthesis of PT education.   Baseline:  10/27/2020 INITIAL  2 Pt will be able to demonstrate ability to perform 10 HR without pain in order to demonstrate functional improvement in LE function for gait and house hold duties.   Baseline:  11/10/2020 INITIAL  3 Pt will have an at least 9 pt improvement in LEFS measure in order to demonstrate MCID improvement in daily function.   Baseline: 11/10/2020 INITIAL    LONG TERM GOALS:    LTG Name Target Date Goal status  1 Pt  will become independent with final HEP in order to demonstrate synthesis of PT education.   Baseline: 12/08/2020 INITIAL  2 Pt will be  able to demonstrate/report ability to walk >20 mins without pain in order to demonstrate functional improvement and tolerance to exercise and community mobility.   Baseline: 12/08/2020 INITIAL  3 Pt will be able to demonstrate 5/5 MMT with L ankle PF and L knee extension in order to demonstrate functional improvement in LE function for daily mobility and house hold duties.  Baseline: 12/08/2020 INITIAL  4 Pt will have an at least 18 pt improvement in LEFS measure in order to demonstrate MCID improvement in daily function.  Baseline: 12/08/2020 INITIAL    PLAN: PT FREQUENCY: 1-2x/week   PT DURATION: 8 weeks   PLANNED INTERVENTIONS: Therapeutic exercises, Therapeutic activity, Neuro Muscular re-education, Balance training, Gait training, Patient/Family education, Joint mobilization, Stair training, Orthotic/Fit training, Aquatic Therapy, Dry Needling, Electrical stimulation, Spinal mobilization, Cryotherapy, Moist heat, scar mobilization, Taping, Vasopneumatic device, Traction, Ultrasound, Ionotophoresis 4mg /ml Dexamethasone, and Manual therapy   PLAN FOR NEXT SESSION: intro to aquatics, gentle R knee ROM/stretching, calf strength as tolerated     PT, DPT 11/03/20 5:51 PM

## 2020-11-07 ENCOUNTER — Ambulatory Visit (INDEPENDENT_AMBULATORY_CARE_PROVIDER_SITE_OTHER): Payer: Medicare Other | Admitting: Cardiology

## 2020-11-07 ENCOUNTER — Other Ambulatory Visit: Payer: Self-pay

## 2020-11-07 ENCOUNTER — Encounter: Payer: Self-pay | Admitting: Nurse Practitioner

## 2020-11-07 ENCOUNTER — Other Ambulatory Visit: Payer: Self-pay | Admitting: Nurse Practitioner

## 2020-11-07 ENCOUNTER — Ambulatory Visit (INDEPENDENT_AMBULATORY_CARE_PROVIDER_SITE_OTHER): Payer: Medicare Other | Admitting: Nurse Practitioner

## 2020-11-07 ENCOUNTER — Encounter (HOSPITAL_BASED_OUTPATIENT_CLINIC_OR_DEPARTMENT_OTHER): Payer: Self-pay | Admitting: Cardiology

## 2020-11-07 VITALS — BP 121/82 | HR 102 | Ht 62.0 in | Wt 220.8 lb

## 2020-11-07 DIAGNOSIS — I428 Other cardiomyopathies: Secondary | ICD-10-CM

## 2020-11-07 DIAGNOSIS — Z0000 Encounter for general adult medical examination without abnormal findings: Secondary | ICD-10-CM

## 2020-11-07 DIAGNOSIS — Z7901 Long term (current) use of anticoagulants: Secondary | ICD-10-CM

## 2020-11-07 DIAGNOSIS — Z7189 Other specified counseling: Secondary | ICD-10-CM | POA: Diagnosis not present

## 2020-11-07 DIAGNOSIS — I10 Essential (primary) hypertension: Secondary | ICD-10-CM

## 2020-11-07 DIAGNOSIS — E7800 Pure hypercholesterolemia, unspecified: Secondary | ICD-10-CM

## 2020-11-07 DIAGNOSIS — G8929 Other chronic pain: Secondary | ICD-10-CM

## 2020-11-07 DIAGNOSIS — I482 Chronic atrial fibrillation, unspecified: Secondary | ICD-10-CM | POA: Diagnosis not present

## 2020-11-07 DIAGNOSIS — R0609 Other forms of dyspnea: Secondary | ICD-10-CM

## 2020-11-07 DIAGNOSIS — M25512 Pain in left shoulder: Secondary | ICD-10-CM

## 2020-11-07 MED ORDER — METOPROLOL SUCCINATE ER 50 MG PO TB24: ORAL_TABLET | ORAL | 1 refills | Status: DC

## 2020-11-07 NOTE — Patient Instructions (Addendum)
Jennifer Mcclain , Thank you for taking time to come for your Medicare Wellness Visit. I appreciate your ongoing commitment to your health goals. Please review the following plan we discussed and let me know if I can assist you in the future.   Screening recommendations/referrals: Colonoscopy up to date Mammogram up to date Bone Density up to date Recommended yearly ophthalmology/optometry visit for glaucoma screening and checkup Recommended yearly dental visit for hygiene and checkup  Vaccinations: Influenza vaccine up to date Pneumococcal vaccine up to date Tdap vaccine up to date Shingles vaccine up to date    Advanced directives: on file.   Conditions/risks identified: advance age, increase BMI 40  Next appointment: yearly for awv   Preventive Care 73 Years and Older, Female Preventive care refers to lifestyle choices and visits with your health care provider that can promote health and wellness. What does preventive care include? A yearly physical exam. This is also called an annual well check. Dental exams once or twice a year. Routine eye exams. Ask your health care provider how often you should have your eyes checked. Personal lifestyle choices, including: Daily care of your teeth and gums. Regular physical activity. Eating a healthy diet. Avoiding tobacco and drug use. Limiting alcohol use. Practicing safe sex. Taking low-dose aspirin every day. Taking vitamin and mineral supplements as recommended by your health care provider. What happens during an annual well check? The services and screenings done by your health care provider during your annual well check will depend on your age, overall health, lifestyle risk factors, and family history of disease. Counseling  Your health care provider may ask you questions about your: Alcohol use. Tobacco use. Drug use. Emotional well-being. Home and relationship well-being. Sexual activity. Eating habits. History of  falls. Memory and ability to understand (cognition). Work and work Astronomer. Reproductive health. Screening  You may have the following tests or measurements: Height, weight, and BMI. Blood pressure. Lipid and cholesterol levels. These may be checked every 5 years, or more frequently if you are over 73 years old. Skin check. Lung cancer screening. You may have this screening every year starting at age 34 if you have a 30-pack-year history of smoking and currently smoke or have quit within the past 15 years. Fecal occult blood test (FOBT) of the stool. You may have this test every year starting at age 73. Flexible sigmoidoscopy or colonoscopy. You may have a sigmoidoscopy every 5 years or a colonoscopy every 10 years starting at age 41. Hepatitis C blood test. Hepatitis B blood test. Sexually transmitted disease (STD) testing. Diabetes screening. This is done by checking your blood sugar (glucose) after you have not eaten for a while (fasting). You may have this done every 1-3 years. Bone density scan. This is done to screen for osteoporosis. You may have this done starting at age 75. Mammogram. This may be done every 1-2 years. Talk to your health care provider about how often you should have regular mammograms. Talk with your health care provider about your test results, treatment options, and if necessary, the need for more tests. Vaccines  Your health care provider may recommend certain vaccines, such as: Influenza vaccine. This is recommended every year. Tetanus, diphtheria, and acellular pertussis (Tdap, Td) vaccine. You may need a Td booster every 10 years. Zoster vaccine. You may need this after age 109. Pneumococcal 13-valent conjugate (PCV13) vaccine. One dose is recommended after age 1. Pneumococcal polysaccharide (PPSV23) vaccine. One dose is recommended after age 58. Talk  to your health care provider about which screenings and vaccines you need and how often you need  them. This information is not intended to replace advice given to you by your health care provider. Make sure you discuss any questions you have with your health care provider. Document Released: 01/24/2015 Document Revised: 09/17/2015 Document Reviewed: 10/29/2014 Elsevier Interactive Patient Education  2017 [place] Prevention in the Home Falls can cause injuries. They can happen to people of all ages. There are many things you can do to make your home safe and to help prevent falls. What can I do on the outside of my home? Regularly fix the edges of walkways and driveways and fix any cracks. Remove anything that might make you trip as you walk through a door, such as a raised step or threshold. Trim any bushes or trees on the path to your home. Use bright outdoor lighting. Clear any walking paths of anything that might make someone trip, such as rocks or tools. Regularly check to see if handrails are loose or broken. Make sure that both sides of any steps have handrails. Any raised decks and porches should have guardrails on the edges. Have any leaves, snow, or ice cleared regularly. Use sand or salt on walking paths during winter. Clean up any spills in your garage right away. This includes oil or grease spills. What can I do in the bathroom? Use night lights. Install grab bars by the toilet and in the tub and shower. Do not use towel bars as grab bars. Use non-skid mats or decals in the tub or shower. If you need to sit down in the shower, use a plastic, non-slip stool. Keep the floor dry. Clean up any water that spills on the floor as soon as it happens. Remove soap buildup in the tub or shower regularly. Attach bath mats securely with double-sided non-slip rug tape. Do not have throw rugs and other things on the floor that can make you trip. What can I do in the bedroom? Use night lights. Make sure that you have a light by your bed that is easy to reach. Do not use  any sheets or blankets that are too big for your bed. They should not hang down onto the floor. Have a firm chair that has side arms. You can use this for support while you get dressed. Do not have throw rugs and other things on the floor that can make you trip. What can I do in the kitchen? Clean up any spills right away. Avoid walking on wet floors. Keep items that you use a lot in easy-to-reach places. If you need to reach something above you, use a strong step stool that has a grab bar. Keep electrical cords out of the way. Do not use floor polish or wax that makes floors slippery. If you must use wax, use non-skid floor wax. Do not have throw rugs and other things on the floor that can make you trip. What can I do with my stairs? Do not leave any items on the stairs. Make sure that there are handrails on both sides of the stairs and use them. Fix handrails that are broken or loose. Make sure that handrails are as long as the stairways. Check any carpeting to make sure that it is firmly attached to the stairs. Fix any carpet that is loose or worn. Avoid having throw rugs at the top or bottom of the stairs. If you do have throw rugs,  attach them to the floor with carpet tape. Make sure that you have a light switch at the top of the stairs and the bottom of the stairs. If you do not have them, ask someone to add them for you. What else can I do to help prevent falls? Wear shoes that: Do not have high heels. Have rubber bottoms. Are comfortable and fit you well. Are closed at the toe. Do not wear sandals. If you use a stepladder: Make sure that it is fully opened. Do not climb a closed stepladder. Make sure that both sides of the stepladder are locked into place. Ask someone to hold it for you, if possible. Clearly mark and make sure that you can see: Any grab bars or handrails. First and last steps. Where the edge of each step is. Use tools that help you move around (mobility aids)  if they are needed. These include: Canes. Walkers. Scooters. Crutches. Turn on the lights when you go into a dark area. Replace any light bulbs as soon as they burn out. Set up your furniture so you have a clear path. Avoid moving your furniture around. If any of your floors are uneven, fix them. If there are any pets around you, be aware of where they are. Review your medicines with your doctor. Some medicines can make you feel dizzy. This can increase your chance of falling. Ask your doctor what other things that you can do to help prevent falls. This information is not intended to replace advice given to you by your health care provider. Make sure you discuss any questions you have with your health care provider. Document Released: 10/24/2008 Document Revised: 06/05/2015 Document Reviewed: 02/01/2014 Elsevier Interactive Patient Education  2017 [employer].

## 2020-11-07 NOTE — Patient Instructions (Signed)
Medication Instructions:  INCREASE metoprolol to 100 mg in the morning and 50 mg every night. Please call our office in 2 weeks for an update. If symptoms are improved, we will send a new prescription.   *If you need a refill on your cardiac medications before your next appointment, please call your pharmacy*   Lab Work: None ordered today   Testing/Procedures: None ordered today   Follow-Up: At [facility], you and your health needs are our priority.  As part of our continuing mission to provide you with exceptional heart care, we have created designated Provider Care Teams.  These Care Teams include your primary Cardiologist (physician) and Advanced Practice Providers (APPs -  Physician Assistants and Nurse Practitioners) who all work together to provide you with the care you need, when you need it.  We recommend signing up for the patient portal called "MyChart".  Sign up information is provided on this After Visit Summary.  MyChart is used to connect with patients for Virtual Visits (Telemedicine).  Patients are able to view lab/test results, encounter notes, upcoming appointments, etc.  Non-urgent messages can be sent to your provider as well.   To learn more about what you can do with MyChart, go to [email or web address].    Your next appointment:   6 month(s)  The format for your next appointment:   In Person  Provider:   [name], MD

## 2020-11-07 NOTE — Progress Notes (Signed)
Subjective:   Jennifer Mcclain is a 75 y.o. female who presents for Medicare Annual (Subsequent) preventive examination.  Review of Systems     Cardiac Risk Factors include: obesity (BMI >30kg/m2);advanced age (>29men, >35 women);sedentary lifestyle;hypertension;dyslipidemia     Objective:    There were no vitals filed for this visit. There is no height or weight on file to calculate BMI.  Advanced Directives 11/07/2020 10/13/2020 09/26/2020 07/29/2020 03/24/2020 10/26/2019 03/19/2019  Does Patient Have a Medical Advance Directive? Yes Yes Yes Yes Yes Yes No  Type of Estate agent of [place];Living will Healthcare Power of [place];Living will Healthcare Power of [place];Living will Healthcare Power of [place];Living will Healthcare Power of [place];Living will Healthcare Power of [place];Living will Healthcare Power of [place];Living will  Does patient want to make changes to medical advance directive? No - Patient declined - No - Patient declined No - Patient declined No - Patient declined No - Patient declined No - Patient declined  Copy of Healthcare Power of Attorney in Chart? Yes - validated most recent copy scanned in chart (See row information) Yes - validated most recent copy scanned in chart (See row information) Yes - validated most recent copy scanned in chart (See row information) Yes - validated most recent copy scanned in chart (See row information) Yes - validated most recent copy scanned in chart (See row information) Yes - validated most recent copy scanned in chart (See row information) Yes - validated most recent copy scanned in chart (See row information)    Current Medications (verified) Outpatient Encounter Medications as of 11/07/2020  Medication Sig   acetaminophen (TYLENOL) 650 MG CR tablet Take 650 mg by mouth in the morning, at noon, and at bedtime.   Calcium Carb-Cholecalciferol (CALCIUM 600/VITAMIN D3 PO) Take 1 tablet by mouth 2 (two)  times daily.   Cholecalciferol (VITAMIN D3) 25 MCG (1000 UT) CAPS Take 1,000 Units by mouth daily.    ENTRESTO 49-51 MG TAKE 1 TABLET BY MOUTH 2 TIMES DAILY   Loratadine (CLARITIN PO) Take by mouth daily.   metoprolol succinate (TOPROL-XL) 50 MG 24 hr tablet Take 2 tablets (100 mg total) by mouth every morning AND 1 tablet (50 mg total) at bedtime. Take with or immediately following a meal..   Multiple Vitamins-Minerals (OCUVITE PO) Take 1 tablet by mouth daily.    polycarbophil (FIBERCON) 625 MG tablet Take 625 mg by mouth daily.   Probiotic Product (PHILLIPS COLON HEALTH PO) Take 1 capsule by mouth daily.    rosuvastatin (CRESTOR) 5 MG tablet TAKE 1 TABLET BY MOUTH 4 TIMES A WEEK   XARELTO 20 MG TABS tablet TAKE 1 TABLET EVERY DAY WITH SUPPER   Zinc 50 MG TABS Take 1 tablet by mouth daily.   No facility-administered encounter medications on file as of 11/07/2020.    Allergies (verified) Latex, Adhesive [tape], Grass extracts [gramineae pollens], Lactose intolerance (gi), Naprosyn [naproxen], Coq10 [coenzyme q10], and Zocor [simvastatin]   History: Past Medical History:  Diagnosis Date   Achilles tendinitis    Per [facility] New Patient packet    Atrial fibrillation [facility])    Per records from [facility] in [place], [place]    Baker's cyst of knee, left    with resulting lower extremity edema   Benign cyst of breast, left    Per records from [facility] in [place], [place]. Has yearly U/S in the past    Cerebrovascular disease    Per [facility] New Patient packet  Chronic atrial fibrillation (HCC)    Per [facility] New Patient packet    Chronic atrial fibrillation (HCC)    [name], MD   Decreased hearing, bilateral    referred to audiologist, Per records from [facility] in [place], [place]    Disorder of bone density and structure, unspecified    Per records from 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 Per records from [facility] in [place], [place]    H/O calculus of kidney during pregnancy    Lynden Ang, MD   H/O hysterectomy for benign disease    Lynden Ang, MD   History of fibula fracture    Right, Per records from [facility] in [place], [place]    History of torn meniscus of right knee    Per records from [facility] in [place], [place]    Humerus fracture    left leg- due to fall   Humerus fracture    Resulting from a fall, Per records from [facility] in [place], [place]    Hypercalcemia    Per records from [facility] in [place], [place]    Hypertension    Per [facility] New Patient packet    Kidney stone on left side    Per records from [facility] in [place], [place]    Lactose intolerance    Per [facility] New Patient packet    Multiple actinic keratoses    Lynden Ang, MD   Myalgia due to statin    Per records from [facility] in [place], [place]. Due to crestor    Non-toxic multinodular goiter 01/21/2017   Lynden Ang, MD   Normocytic anemia    Lynden Ang, MD   Obstructive sleep apnea syndrome    Per records from [facility] in [place], [place]. Patient did not tolerate CPAP machine    Onychomycosis    Per records from [facility] in [place], [place]    Osteoarthritis    Per [facility] New Patient packet    Osteopenia    Per records from [facility] in [place], [place]. Treated in the past with antiresorptive therapy   Renal cyst 10/11/2012   Simple on CT Scan 10/2012. 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   Lynden Ang , MD   Umbilical hernia    Per [facility] New Patient packet    Vitamin D deficiency    Lynden Ang, MD   Past Surgical History:  Procedure Laterality Date   ABDOMINAL HYSTERECTOMY  1989    One ovary, Per [facility] New Patient packet    ABLATION  1996   Radical, Per [facility] New Patient packet    BREAST CYST ASPIRATION Bilateral    numerous, over several years per pt, always benign   COLONOSCOPY  2019   Dr.Melissa Fayrene Fearing, Per [facility] New Patient packet    KNEE ARTHROSCOPY W/ MENISCAL REPAIR  2002   Per [facility] New Patient packet    MOUTH SURGERY     Dental Implant    REPLACEMENT TOTAL KNEE Left 2015   Per [facility] New Patient packet   RIGHT/LEFT HEART CATH AND CORONARY ANGIOGRAPHY N/A 02/07/2018   Procedure: RIGHT/LEFT HEART CATH AND CORONARY ANGIOGRAPHY;  Surgeon: Swaziland, Peter M, MD;  Location: [facility] INVASIVE CV LAB;  Service: Cardiovascular;  Laterality: N/A;   TONSILLECTOMY  1951   Per [facility] New Patient packet    TOOTH EXTRACTION  01/12/2019   TOOTH EXTRACTION  06/05/2019   Preparing for dental implant    Family History  Problem Relation Age of Onset   Congestive Heart Failure Mother    Diabetes Mother 71   Arthritis Mother 74   Stroke Father    Dementia Father    Diabetes Father    Hyperlipidemia Father    Alzheimer's disease Father 58   Diabetes Sister    Cancer Maternal Grandfather    Diabetes Maternal Grandfather 45   CVA Maternal Grandfather    AAA (abdominal aortic aneurysm) Sister    Congenital heart disease Daughter    Heart attack Maternal Aunt    Lung cancer Maternal Grandmother 60   Breast cancer Paternal Aunt    Social History   Socioeconomic History   Marital status: Married    Spouse name: Not on file   Number of children: Not on file   Years of education: Not on file   Highest education level: Not on file  Occupational History   Not on file  Tobacco Use   Smoking status: Never   Smokeless tobacco: Never  Vaping Use   Vaping Use: Never used  Substance and Sexual Activity   Alcohol use: Yes    Comment: 2-3 a month - social   Drug use: Never   Sexual activity: Not on file  Other Topics Concern   Not on file  Social History Narrative   Per [facility] New Patient  Packet 12/06/17:      Diet: Low fat, dairy free      Caffeine: Yes      Married, if yes what year: Married, 1971      Do you live in a house, apartment, assisted living, condo, trailer, ect: Condo, 2 stories, 2 persons       Pets: No      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 No   Do you have difficulty preparing food or eating? No   Do you have difficulty managing your medications? No   Do you have difficulty managing your finances? No   Do you have difficulty affording your medications? No   Social Determinants of Corporate investment banker Strain: Not on file  Food Insecurity: Not on file  Transportation Needs: Not on file  Physical Activity: Not on file  Stress: Not on file  Social Connections: Not on file    Tobacco Counseling Counseling given: Not Answered   Clinical Intake:  Pre-visit preparation completed: Yes  Pain : No/denies pain     BMI - recorded: 40 Nutritional Status: BMI > 30  Obese Diabetes: No  How often do you need to have someone help you when you read instructions, pamphlets, or other written materials from your doctor or pharmacy?: 1 - Never  Diabetic?no         Activities of Daily Living In your present state of health, do you have any difficulty performing the following activities: 11/07/2020  Hearing? N  Vision? N  Difficulty concentrating or making decisions? N  Walking or climbing stairs? Y  Dressing or bathing? N  Doing errands, shopping? N  Preparing Food and eating ? N  Using the Toilet? N  In the past six months, have you accidently leaked urine? Y  Do you have problems with loss of bowel control? N  Managing your Medications? N  Managing your Finances? N  Housekeeping or  managing your Housekeeping? N  Some recent data might be hidden    Patient Care Team: [name], NP as PCP - General (Geriatric Medicine) [name], MD as PCP - Cardiology (Cardiology) Zebedee Iba, PT as Physical Therapist (Physical Therapy) Manning Charity, OD as Referring Physician (Optometry)  Indicate any recent Medical Services you may have received from other than [facility] providers in the past year (date may be approximate).     Assessment:   This is a routine wellness examination for [place].  Hearing/Vision screen Hearing Screening - Comments:: Decreased hearing, patient was told she could qualify for hearing aids, may consider in the near future.  Vision Screening - Comments:: Last eye exam less than 12 months ago, Dr.Gould [facility])   Dietary issues and exercise activities discussed: Current Exercise Habits: Structured exercise class, Type of exercise: Other - see comments;yoga, Time (Minutes): 30, Frequency (Times/Week): 5, Weekly Exercise (Minutes/Week): 150   Goals Addressed   None    Depression Screen PHQ 2/9 Scores 11/07/2020 03/24/2020 03/19/2019 03/19/2019 08/16/2018 02/15/2018 12/07/2017  PHQ - 2 Score 0 0 0 0 0 0 0    Fall Risk Fall Risk  11/07/2020 09/26/2020 07/29/2020 03/24/2020 09/24/2019  Falls in the past year? 1 1 0 1 1  Number falls in past yr: 0 0 0 0 0  Injury with Fall? 1 1 0 0 1  Comment - Twisted left knee - - Sprain right wrist  Risk for fall due to : No Fall Risks History of fall(s) No Fall Risks - Other (Comment)  Risk for fall due to: Comment - - - - wore wrong shoes  Follow up Falls evaluation completed Falls evaluation completed Falls evaluation completed - -    FALL RISK PREVENTION PERTAINING TO THE HOME:  Any stairs in or around the home? Yes  If so, are there any without handrails? No  Home free of loose throw rugs in walkways, pet beds, electrical cords, etc? Yes  Adequate lighting in your home to reduce risk  of falls? Yes   ASSISTIVE DEVICES UTILIZED TO PREVENT FALLS:  Life alert? No  Use of a cane, walker or w/c? No  Grab bars in the bathroom? Yes  Shower chair or bench in shower? Yes  Elevated toilet seat or a handicapped toilet? Yes   TIMED UP AND GO:  Was the test performed? No .   Cognitive Function: MMSE - Mini Mental State Exam 03/19/2019 02/15/2018  Orientation to time 5 5  Orientation to Place 5 5  Registration 3 3  Attention/ Calculation 5 5  Recall 3 3  Language- name 2 objects 2 2  Language- repeat 1 1  Language- follow 3 step command 3 1  Language- read & follow direction 1 1  Write a sentence 1 -  Copy design 1 1  Total score 30 -     6CIT Screen 11/07/2020  What Year? 0 points  What month? 0 points  What time? 0 points  Count back from 20 0 points  Months in reverse 0 points  Repeat phrase 0 points  Total Score 0    Immunizations Immunization History  Administered Date(s) Administered   Fluad Quad(high Dose 65+) 09/14/2018, 09/24/2019, 09/26/2020   Influenza, High Dose Seasonal PF 10/31/2017   Influenza, Quadrivalent, Recombinant, Inj, Pf 10/11/2016   Influenza,inj,quad, With Preservative 10/11/2016   PFIZER(Purple Top)SARS-COV-2 Vaccination 01/30/2019, 02/19/2019, 11/06/2019   Pneumococcal Conjugate-13 11/20/2013   Pneumococcal Polysaccharide-23 11/13/2012   Tdap 11/27/2007, 07/05/2018   Zoster Recombinat (Shingrix) 05/08/2016, 08/14/2016    TDAP status: Up to date  Flu Vaccine status: Up to date  Pneumococcal vaccine status: Up to date  Covid-19 vaccine status: Information provided on how to obtain vaccines.   Qualifies for Shingles Vaccine? Yes   Zostavax completed No   Shingrix Completed?: Yes  Screening Tests Health Maintenance  Topic Date Due   COVID-19 Vaccine (4 - Booster for Pfizer series) 01/01/2020   MAMMOGRAM  09/18/2022   Fecal DNA (Cologuard)  04/02/2023   TETANUS/TDAP  07/04/2028   Pneumonia Vaccine 72+ Years old   Completed   INFLUENZA VACCINE  Completed   DEXA SCAN  Completed   Hepatitis C Screening  Completed   Zoster Vaccines- Shingrix  Completed   HPV VACCINES  Aged Out    Health Maintenance  Health Maintenance Due  Topic Date Due   COVID-19 Vaccine (4 - Booster for Pfizer series) 01/01/2020    Colorectal cancer screening: Type of screening: Cologuard. Completed 04/01/20. Repeat every 3 years  Mammogram status: Completed 09/17/20. Repeat every year  Bone Density status: Completed 03/11/20. Results reflect: Bone density results: OSTEOPENIA. Repeat every 2 years.  Lung Cancer Screening: (Low Dose CT Chest recommended if Age 81-80 years, 30 pack-year currently smoking OR have quit w/in 15years.) does not qualify.   Lung Cancer Screening Referral: na  Additional Screening:  Hepatitis C Screening: does qualify; Completed   Vision Screening: Recommended annual ophthalmology exams for early detection of glaucoma and other disorders of the eye. Is the patient up to date with their annual eye exam?  Yes  Who is the provider or what is the name of the office in which the patient attends annual eye exams? [facility] If pt is not established with a provider, would they like to be referred to a provider to establish care? No .   Dental Screening: Recommended annual dental exams for proper oral hygiene  Community Resource Referral / Chronic Care Management: CRR required this visit?  No   CCM required this visit?  No      Plan:     I have personally reviewed and noted the following in the patient's chart:   Medical and social history Use of alcohol, tobacco or illicit drugs  Current medications and supplements including opioid prescriptions.  Functional ability and status Nutritional status Physical activity Advanced directives List of other physicians Hospitalizations, surgeries, and ER visits in previous 12 months Vitals Screenings to include cognitive, depression, and falls Referrals  and appointments  In addition, I have reviewed and discussed with patient certain preventive protocols, quality metrics, and best practice recommendations. A written personalized care plan for preventive services as well as general preventive health recommendations were provided to patient.     [name], NP   11/07/2020    Virtual Visit via Telephone Note  I connected withNAME@ on 11/07/20 at  4:15 PM EDT by telephone and verified that I am speaking with the correct person using two identifiers.  Location: Patient: home Provider: [facility]   I discussed the limitations, risks,  security and privacy concerns of performing an evaluation and management service by telephone and the availability of in person appointments. I also discussed with the patient that there may be a patient responsible charge related to this service. The patient expressed understanding and agreed to proceed.   I discussed the assessment and treatment plan with the patient. The patient was provided an opportunity to ask questions and all were answered. The patient agreed with the plan and demonstrated an understanding of the instructions.   The patient was advised to call back or seek an in-person evaluation if the symptoms worsen or if the condition fails to improve as anticipated.  I provided 15 minutes of non-face-to-face time during this encounter.  Janene Harvey. [employer] printed and mailed

## 2020-11-07 NOTE — Progress Notes (Signed)
Cardiology Office Note:    Date:  11/10/2020   ID:  Jennifer Mcclain, DOB [birth year], MRN [id number]  PCP:  [name], NP  Cardiologist:  [name], MD PhD  Referring MD: [name], NP   CC: follow up  History of Present Illness:    Jennifer Mcclain is a 75 y.o. female with a hx of permanent atrial fibrillation on DOAC, nonischemic cardiomyopathy, hyperlipidemia, hypertension who is seen for follow up. Initial consult with me was on 01/17/18 for atrial fibrillation.  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 Had an ablation done, but was told part of it was too close to the His bundle and so the EP cardiologist ablated all around it but not in the bundle. Moved to [place] about 10 years ago, was told there was no evidence of WPW. Daughter passed away from heart disease; had a kink in one of her arteries, died at age 42. Since coming to [facility], had echo with abnormal EF, moderate MR, moderate-severe TR. Cath did not show significant CAD. Monitor x2 showed 100% afib, now permanent based on patient preference for rate control only.  Today: She is active at baseline at [facility], doing both water exercises and low impact yoga/stretching several times a week. She is not limited by shortness of breath with these activities, but she does note that she feels short of breath with walking. She is asking about the atrial fibrillation and cardiomyopathy and how these might impact her symptoms. We spent time today discussing this in depth.  Denies chest pain, shortness of breath at rest. No PND, orthopnea, LE edema or unexpected weight gain. No syncope or palpitations.   Past Medical History:  Diagnosis Date   Achilles tendinitis    Per [facility] New Patient packet    Atrial fibrillation [facility])    Per records from [facility] in [place], [place]    Baker's cyst of knee, left    with resulting lower extremity edema   Benign cyst of breast, left     Per records from [facility] in [place], [place]. 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 Per records from [facility] in [place], [place]    H/O calculus of kidney during pregnancy    Lynden Ang, MD   H/O hysterectomy for benign disease    Lynden Ang, MD   History of fibula fracture    Right, Per records from [facility] in [place], [place]    History of torn meniscus of right knee    Per records from [facility] in [place], [place]    Humerus fracture    left leg- due to fall   Humerus fracture    Resulting from a fall, Per records from [facility] in [place], [place]  Hypercalcemia    Per records from [facility] in [place], [place]    Hypertension    Per [facility] New Patient packet    Kidney stone on left side    Per records from [facility] in [place], [place]    Lactose intolerance    Per [facility] New Patient packet    Multiple actinic keratoses    Lynden Ang, MD   Myalgia due to statin    Per records from [facility] in [place], [place]. Due to crestor    Non-toxic multinodular goiter 01/21/2017   Lynden Ang, MD   Normocytic anemia    Lynden Ang, MD   Obstructive sleep apnea syndrome    Per records from [facility] in [place], [place]. Patient did not tolerate CPAP machine    Onychomycosis    Per records from [facility] in  [place], [place]    Osteoarthritis    Per [facility] New Patient packet    Osteopenia    Per records from [facility] in [place], [place]. Treated in the past with antiresorptive therapy   Renal cyst 10/11/2012   Simple on CT Scan 10/2012. Per records from [facility] in [place], [place]    Sensorineural hearing loss (SNHL), bilateral 01/28/2017   Lynden Ang , MD   Umbilical hernia    Per [facility] New Patient packet    Vitamin D deficiency    Lynden Ang, MD    Past Surgical History:  Procedure Laterality Date   ABDOMINAL HYSTERECTOMY  1989   One ovary, Per [facility] New Patient packet    ABLATION  1996   Radical, Per [facility] New Patient packet    BREAST CYST ASPIRATION Bilateral    numerous, over several years per pt, always benign   COLONOSCOPY  2019   Dr.Melissa Fayrene Fearing, Per [facility] New Patient packet    KNEE ARTHROSCOPY W/ MENISCAL REPAIR  2002   Per [facility] New Patient packet    MOUTH SURGERY     Dental Implant    REPLACEMENT TOTAL KNEE Left 2015   Per [facility] New Patient packet   RIGHT/LEFT HEART CATH AND CORONARY ANGIOGRAPHY N/A 02/07/2018   Procedure: RIGHT/LEFT HEART CATH AND CORONARY ANGIOGRAPHY;  Surgeon: Swaziland, Peter M, MD;  Location: [facility] INVASIVE CV LAB;  Service: Cardiovascular;  Laterality: N/A;   TONSILLECTOMY  1951   Per [facility] New Patient packet    TOOTH EXTRACTION  01/12/2019   TOOTH EXTRACTION  06/05/2019   Preparing for dental implant     Current Medications: Current Outpatient Medications on File Prior to Visit  Medication Sig   acetaminophen (TYLENOL) 650 MG CR tablet Take 650 mg by mouth in the morning, at noon, and at bedtime.   Calcium Carb-Cholecalciferol (CALCIUM 600/VITAMIN D3 PO) Take 1 tablet by mouth 2 (two) times daily.   Cholecalciferol (VITAMIN D3) 25 MCG (1000 UT) CAPS Take 1,000 Units by mouth daily.    ENTRESTO 49-51 MG TAKE 1 TABLET BY MOUTH 2 TIMES DAILY   Loratadine (CLARITIN PO) Take by mouth daily.   Multiple Vitamins-Minerals (OCUVITE PO) Take 1  tablet by mouth daily.    polycarbophil (FIBERCON) 625 MG tablet Take 625 mg by mouth daily.   Probiotic Product (PHILLIPS COLON HEALTH PO) Take 1 capsule by mouth daily.    rosuvastatin (CRESTOR) 5 MG tablet TAKE 1 TABLET BY MOUTH 4 TIMES A WEEK   XARELTO 20 MG TABS tablet TAKE 1 TABLET EVERY DAY WITH SUPPER   Zinc  50 MG TABS Take 1 tablet by mouth daily.   No current facility-administered medications on file prior to visit.     Allergies:   Latex, Adhesive [tape], Grass extracts [gramineae pollens], Lactose intolerance (gi), Naprosyn [naproxen], Coq10 [coenzyme q10], and Zocor [simvastatin]   Social History   Tobacco Use   Smoking status: Never   Smokeless tobacco: Never  Vaping Use   Vaping Use: Never used  Substance Use Topics   Alcohol use: Yes    Comment: 2-3 a month - social   Drug use: Never    Family History: The patient's family history includes AAA (abdominal aortic aneurysm) in her sister; Alzheimer's disease (age of onset: 27) in her father; Arthritis (age of onset: 77) in her mother; Breast cancer in her paternal aunt; CVA in her maternal grandfather; Cancer in her maternal grandfather; Congenital heart disease in her daughter; Congestive Heart Failure in her mother; Dementia in her father; Diabetes in her father and sister; Diabetes (age of onset: 22) in her mother; Diabetes (age of onset: 51) in her maternal grandfather; Heart attack in her maternal aunt; Hyperlipidemia in her father; Lung cancer (age of onset: 62) in her maternal grandmother; Stroke in her father.  ROS:   Please see the history of present illness.  Additional pertinent ROS otherwise unremarkable   EKGs/Labs/Other Studies Reviewed:    The following studies were reviewed today: Monitor 01/22/19: 7 days of data recorded on Zio monitor. Patient had a min HR of 46 bpm, max HR of 111 bpm, and avg HR of 68 bpm. Patient was in atrial fibrillation 100% of the time. No VT, high degree block, or pauses noted.  Isolated ventricular ectopy was rare (<1%). There were 0 triggered events.   Monitor 02/13/18 ~3 days of data recorded on Zio monitor. No high degree block or pauses noted. Isolated atrial and ventricular ectopy was rare (<1%). There were 2 patient triggered events, one SVT and one sinus rhythm with PVC.   1 run of tachycardia occurred lasting 11 beats with a max rate of 200 bpm (avg 168 bpm). It is labeled as VT on the monitor. While VT cannot be excluded, it appears likely atrial fibrillation with rate related aberrancy. Atrial Fibrillation occurred continuously (100% burden), ranging from 51-163 bpm (avg of 92 bpm). Isolated VEs were occasional (2.7%), VE Couplets were rare (<1.0%), and VE Triplets were rare (<1.0%, 1). Ventricular Bigeminy and Trigeminy were rare, with individual episodes lasting 5 seconds or less.  Cath 02/07/18 Prox LAD lesion is 20% stenosed. LV end diastolic pressure is mildly elevated. There is moderate left ventricular systolic dysfunction. Hemodynamic findings consistent with mild pulmonary hypertension.   1. Minimal nonobstructive CAD with calcified plaque in the proximal LAD 2. Moderate LV dysfunction. EF estimated at 40-45%.  3. Mildly elevated LV filling pressures. V waves prominent on PCWP tracing 4. Mild pulmonary HTN 5. Low cardiac output. Index 2.06. 6. Mild to Moderate MR.    Plan: Optimize medical therapy for CHF. Consider repeat Echo once medical therapy optimized to reassess valve regurgitation.   Fick Cardiac Output 3.88 L/min  Fick Cardiac Output Index 2.06 (L/min)/BSA  RA A Wave -99 mmHg  RA V Wave 11 mmHg  RA Mean 9 mmHg  RV Systolic Pressure 44 mmHg  RV Diastolic Pressure 2 mmHg  RV EDP 9 mmHg  PA Systolic Pressure 47 mmHg  PA Diastolic Pressure 13 mmHg  PA Mean 34 mmHg  PW A Wave -99 mmHg  PW V Wave 32  mmHg  PW Mean 21 mmHg  AO Systolic Pressure 137 mmHg  AO Diastolic Pressure 73 mmHg  AO Mean 99 mmHg  LV Systolic Pressure 137 mmHg   LV Diastolic Pressure 8 mmHg  LV EDP 18 mmHg  AOp Systolic Pressure 138 mmHg  AOp Diastolic Pressure 67 mmHg  AOp Mean Pressure 94 mmHg  LVp Systolic Pressure 148 mmHg  LVp Diastolic Pressure 10 mmHg  LVp EDP Pressure 15 mmHg  QP/QS 1  TPVR Index 16.45 HRUI  TSVR Index 47.91 HRUI  PVR SVR Ratio 0.1  TPVR/TSVR Ratio 0.34   Echo 01/25/18 Study Conclusions   - Left ventricle: The cavity size was normal. Systolic function was   moderately reduced. The estimated ejection fraction was in the   range of 35% to 40%. Although no diagnostic regional wall motion   abnormality was identified, this possibility cannot be completely   excluded on the basis of this study. - Aortic valve: Transvalvular velocity was within the normal range.   There was no stenosis. There was no regurgitation. - Mitral valve: Moderately calcified annulus. There was moderate   regurgitation. - Left atrium: The atrium was moderately dilated. - Right ventricle: The cavity size was mildly dilated. Wall   thickness was normal. Systolic function was normal. RV systolic   pressure (S, est): 44 mm Hg. - Right atrium: The atrium was moderately dilated. Central venous   pressure (est): 10 mm Hg. - Tricuspid valve: Dilated annulus. Structurally normal valve.   There was moderate-severe regurgitation. - Pulmonic valve: There was mild regurgitation. - Pulmonary arteries: Systolic pressure was moderately increased.   PA peak pressure: 44 mm Hg (S). - Inferior vena cava: The vessel was normal in size. The   respirophasic diameter changes were blunted (< 50%). - Pericardium, extracardiac: There was no pericardial effusion.  EKG:  EKG is personally reviewed.   11/07/20 atrial fibrillation at 102 bpm 11/06/19 atrial fibrillation with heart rate 93 bpm and poor R wave progression.  Recent Labs: 09/23/2020: ALT 5; BUN 19; Creat 0.97; Hemoglobin 13.5; Platelets 180; Potassium 4.4; Sodium 138  Recent Lipid Panel    Component  Value Date/Time   CHOL 199 09/23/2020 0940   TRIG 87 09/23/2020 0940   HDL 90 09/23/2020 0940   CHOLHDL 2.2 09/23/2020 0940   LDLCALC 91 09/23/2020 0940    Physical Exam:    VS:  BP 121/82   Pulse (!) 102   Ht 5\' 2"  (1.575 m)   Wt 220 lb 12.8 oz (100.2 kg)   SpO2 96%   BMI 40.38 kg/m     Wt Readings from Last 3 Encounters:  11/07/20 220 lb 12.8 oz (100.2 kg)  09/26/20 220 lb (99.8 kg)  07/29/20 220 lb 3.2 oz (99.9 kg)    GEN: Well nourished, well developed in no acute distress HEENT: Normal, moist mucous membranes NECK: No JVD CARDIAC: irregularly irregular rhythm, normal S1 and S2, no rubs or gallops. No murmur. VASCULAR: Radial and DP pulses 2+ bilaterally. No carotid bruits RESPIRATORY:  Clear to auscultation without rales, wheezing or rhonchi  ABDOMEN: Soft, non-tender, non-distended MUSCULOSKELETAL:  Ambulates independently SKIN: Warm and dry, no edema NEUROLOGIC:  Alert and oriented x 3. No focal neuro deficits noted. PSYCHIATRIC:  Normal affect    ASSESSMENT:    1. Dyspnea on exertion   2. Nonischemic cardiomyopathy (HCC) Chronic  3. Chronic atrial fibrillation (HCC) Chronic  4. Essential hypertension   5. Pure hypercholesterolemia   6. Anticoagulant long-term use  7. Cardiac risk counseling   8. Counseling on health promotion and disease prevention     PLAN:    Nonischemic cardiomyopathy with reduced EF, moderate MR, and moderate-severe TR Dyspnea on exertion -cath with minimal nonobstructive CAD -MR more mild-moderate on cath; improved MR and TR on most recent echo -tolerating entresto well. Did discuss that even with improvement in EF, recommendations are to continue entresto given good response. -continue metoprolol succinate -overall NYHA II symptoms, with walking but not with water exercise or low impact exercise -see below. Rate above goal today. Will increase metoprolol and monitor symptoms. She will contact us in 2 weeks to see if symptoms have  improved. If not, would repeat echo.   Atrial fibrillation: now permanent  -monitor 01/2018 showed 100% burden of afib. Repeat monitor showed the same 01/2019. -see prior notes. We have discussed attempts at restoration of sinus rhythm. Amiodarone did not keep her in rhythm. She wished to pursue rate control strategy -rate control with metoprolol. Her rate is up slightly today. We will trial increasing AM dose of metoprolol to see if this helps her symptoms. Discussed side effects to watch for -CHA2DS2/VAS Stroke Risk Points=3, continue rivaroxaban for anticoagulation   Hypertension:  -at goal -Continue entresto and metoprolol   Hyperlipidemia (hypercholesterolemia):  -continue rosuvastatin, tolerating 4x/week  CV risk counseling and prevention: -recommend heart healthy/Mediterranean diet, with whole grains, fruits, vegetable, fish, lean meats, nuts, and olive oil. Limit salt. -recommend moderate walking, 3-5 times/week for 30-50 minutes each session. Aim for at least 150 minutes.week. Goal should be pace of 3 miles/hours, or walking 1.5 miles in 30 minutes -recommend avoidance of tobacco products. Avoid excess alcohol.  Follow up: 6 mos or sooner as needed  [name], MD, PhD [place]  [facility]   Medication Adjustments/Labs and Tests Ordered: Current medicines are reviewed at length with the patient today.  Concerns regarding medicines are outlined above.  Orders Placed This Encounter  Procedures   EKG 12-Lead    Meds ordered this encounter  Medications   metoprolol succinate (TOPROL-XL) 50 MG 24 hr tablet    Sig: Take 2 tablets (100 mg total) by mouth every morning AND 1 tablet (50 mg total) at bedtime. Take with or immediately following a meal..    Dispense:  180 tablet    Refill:  1    This prescription was filled on 10/03/2020. Any refills authorized will be placed on file.     Patient Instructions  Medication Instructions:  INCREASE metoprolol to  100 mg in the morning and 50 mg every night. Please call our office in 2 weeks for an update. If symptoms are improved, we will send a new prescription.   *If you need a refill on your cardiac medications before your next appointment, please call your pharmacy*   Lab Work: None ordered today   Testing/Procedures: None ordered today   Follow-Up: At [facility], you and your health needs are our priority.  As part of our continuing mission to provide you with exceptional heart care, we have created designated Provider Care Teams.  These Care Teams include your primary Cardiologist (physician) and Advanced Practice Providers (APPs -  Physician Assistants and Nurse Practitioners) who all work together to provide you with the care you need, when you need it.  We recommend signing up for the patient portal called "MyChart".  Sign up information is provided on this After Visit Summary.  MyChart is used to connect with patients for Virtual Visits (  Telemedicine).  Patients are able to view lab/test results, encounter notes, upcoming appointments, etc.  Non-urgent messages can be sent to your provider as well.   To learn more about what you can do with MyChart, go to [email or web address].    Your next appointment:   6 month(s)  The format for your next appointment:   In Person  Provider:   [name], MD     Signed, [name], MD PhD 11/10/2020  [facility]

## 2020-11-07 NOTE — Progress Notes (Signed)
   This service is provided via telemedicine  No vital signs collected/recorded due to the encounter was a telemedicine visit.   Location of patient (ex: home, work):  Home  Patient consents to a telephone visit: Yes, see telephone visit dated 11/07/20  Location of the provider (ex: office, home):  [facility], Office   Name of any referring provider:  N/A  Names of all persons participating in the telemedicine service and their role in the encounter:  S.Chrae B/CMA, Abbey Chatters, NP, and Patient   Time spent on call:  9 min with medical assistant

## 2020-11-10 ENCOUNTER — Encounter (HOSPITAL_BASED_OUTPATIENT_CLINIC_OR_DEPARTMENT_OTHER): Payer: Self-pay | Admitting: Cardiology

## 2020-11-10 ENCOUNTER — Encounter (HOSPITAL_BASED_OUTPATIENT_CLINIC_OR_DEPARTMENT_OTHER): Payer: Medicare Other | Admitting: Physical Therapy

## 2020-11-12 ENCOUNTER — Encounter (HOSPITAL_BASED_OUTPATIENT_CLINIC_OR_DEPARTMENT_OTHER): Payer: Self-pay | Admitting: Physical Therapy

## 2020-11-12 ENCOUNTER — Ambulatory Visit (HOSPITAL_BASED_OUTPATIENT_CLINIC_OR_DEPARTMENT_OTHER): Payer: Medicare Other | Attending: Nurse Practitioner | Admitting: Physical Therapy

## 2020-11-12 ENCOUNTER — Other Ambulatory Visit: Payer: Self-pay

## 2020-11-12 DIAGNOSIS — M79672 Pain in left foot: Secondary | ICD-10-CM | POA: Diagnosis not present

## 2020-11-12 DIAGNOSIS — R262 Difficulty in walking, not elsewhere classified: Secondary | ICD-10-CM | POA: Insufficient documentation

## 2020-11-12 DIAGNOSIS — R2681 Unsteadiness on feet: Secondary | ICD-10-CM | POA: Insufficient documentation

## 2020-11-12 DIAGNOSIS — M25561 Pain in right knee: Secondary | ICD-10-CM | POA: Insufficient documentation

## 2020-11-12 DIAGNOSIS — R2689 Other abnormalities of gait and mobility: Secondary | ICD-10-CM | POA: Insufficient documentation

## 2020-11-12 DIAGNOSIS — M25512 Pain in left shoulder: Secondary | ICD-10-CM | POA: Insufficient documentation

## 2020-11-12 DIAGNOSIS — M25511 Pain in right shoulder: Secondary | ICD-10-CM | POA: Diagnosis not present

## 2020-11-12 DIAGNOSIS — G8929 Other chronic pain: Secondary | ICD-10-CM | POA: Insufficient documentation

## 2020-11-12 DIAGNOSIS — M25562 Pain in left knee: Secondary | ICD-10-CM | POA: Diagnosis not present

## 2020-11-12 DIAGNOSIS — M6281 Muscle weakness (generalized): Secondary | ICD-10-CM | POA: Diagnosis not present

## 2020-11-12 DIAGNOSIS — R293 Abnormal posture: Secondary | ICD-10-CM | POA: Diagnosis not present

## 2020-11-12 NOTE — Therapy (Signed)
OUTPATIENT PHYSICAL THERAPY TREATMENT NOTE   Patient Name: Jennifer Mcclain MRN: [id number] DOB:15-Nov-1945, 75 y.o., female Today's Date: 11/12/2020  PCP: Lauree Chandler, NP REFERRING PROVIDER: Lauree Chandler, NP   PT End of Session - 11/12/20 1413     Visit Number 5    Number of Visits 17    Date for PT Re-Evaluation 01/11/21    Authorization Type Medicare    PT Start Time [id number]    PT Stop Time 1503    PT Time Calculation (min) 48 min    Activity Tolerance Patient tolerated treatment well;Patient limited by pain    Behavior During Therapy [facility] for tasks assessed/performed              Past Medical History:  Diagnosis Date   Achilles tendinitis    Per [place] Patient packet    Atrial fibrillation [facility])    Per records from [facility] in [place], [place]    Baker's cyst of knee, left    with resulting lower extremity edema   Benign cyst of breast, left    Per records from [facility] in [place], [place]. Has yearly U/S in the past    Cerebrovascular disease    Per [facility] New Patient packet    Chronic atrial fibrillation ([place])    Per [place] Patient packet    Chronic atrial fibrillation (HCC)    [name], MD   Decreased hearing, bilateral    referred to audiologist, Per records from [facility] in [place], [place]    Disorder of bone density and structure, unspecified    Per records from [facility] in [place], [place]    Disorder of carotid artery [facility])    [name], MD   Dyslipidemia    [name], MD   Elevated alkaline phosphatase level    Neg ative ANA 08/2013. Per records from [facility] in [place], [place]    H/O calculus of kidney during pregnancy    [name], MD   H/O hysterectomy for benign disease    [name], MD   History of fibula fracture    Right, Per records from [facility] in [place], [place]    History of torn meniscus of right knee    Per records from [facility] in [place], [place]    Humerus fracture    left leg- due to fall   Humerus fracture    Resulting from a fall, Per records from [facility] in [place], [place]    Hypercalcemia    Per records from [facility] in [place], [place]    Hypertension    Per [facility] New Patient packet    Kidney stone on left side    Per records from [facility] in [place], [place]    Lactose intolerance    Per [facility] New Patient packet    Multiple actinic keratoses    [name], MD   Myalgia due to statin    Per records from [facility] in [place], [place]. Due to crestor    Non-toxic multinodular goiter 01/21/2017   [name], MD   Normocytic anemia    [name], MD   Obstructive sleep apnea syndrome    Per records from [facility] in [place], [place]. Patient did not tolerate CPAP machine    Onychomycosis    Per records from [facility] in [place], [place]    Osteoarthritis    Per  [place] New Patient packet    Osteopenia    Per records from [facility] in [place], [place]. Treated in the past with antiresorptive therapy   Renal cyst 10/11/2012   Simple on CT Scan 10/2012. Per records from [facility] in [place], [place]    Sensorineural hearing loss (SNHL), bilateral 01/28/2017   [name] , MD   Umbilical hernia    Per [facility] New Patient packet    Vitamin D deficiency    [name], MD   Past Surgical History:  Procedure Laterality Date   ABDOMINAL HYSTERECTOMY  1989   One ovary, Per [place] New Patient packet    ABLATION  1996   Radical, Per [facility] New Patient packet    BREAST CYST ASPIRATION Bilateral    numerous, over several years per pt, always benign   COLONOSCOPY  2019   Dr.Melissa Jeneen Rinks, Per [facility] New Patient packet    KNEE ARTHROSCOPY W/ MENISCAL REPAIR  2002   Per [facility] New Patient packet    MOUTH SURGERY     Dental Implant    REPLACEMENT TOTAL KNEE Left 2015   Per [facility] New Patient packet   RIGHT/LEFT  HEART CATH AND CORONARY ANGIOGRAPHY N/A 02/07/2018   Procedure: RIGHT/LEFT HEART CATH AND CORONARY ANGIOGRAPHY;  Surgeon: Martinique, Peter M, MD;  Location: [facility];  Service: Cardiovascular;  Laterality: N/A;   TONSILLECTOMY  1951   Per [place] Patient packet    TOOTH EXTRACTION  01/12/2019   TOOTH EXTRACTION  06/05/2019   Preparing for dental implant    Patient Active Problem List   Diagnosis Date Noted   Anticoagulant long-term use 05/04/2019   Chronic combined systolic and diastolic heart failure ([place]) 05/04/2019   Positive 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 09/15/2018   Pure hypercholesterolemia 06/14/2018   Coronary artery disease due to lipid rich plaque [id number]   Systolic dysfunction without heart failure 02/02/2018   Nonischemic cardiomyopathy ([place]) 02/02/2018   Nonrheumatic mitral valve regurgitation 02/02/2018   Nonrheumatic tricuspid valve regurgitation 02/02/2018   Osteopenia 02/22/2017   Bilateral sensorineural hearing loss 01/28/2017   Vitamin D deficiency 01/21/2017   Osteoarthritis of knee 01/21/2017   Non-toxic multinodular goiter 01/21/2017   IFG (impaired fasting glucose) 01/21/2017   Lactose intolerance 01/21/2017   History of hysterectomy for benign disease 01/21/2017   Multiple actinic keratoses 01/21/2017   Normocytic anemia [id number]   Umbilical hernia [id number]   Essential hypertension 11/30/2016   Dyslipidemia 11/30/2016   Chronic atrial fibrillation ([place]) 11/30/2016   Obstructive sleep apnea syndrome 11/30/2016   Renal cyst 10/11/2012    REFERRING DIAG: M25.562 (ICD-10-CM) - Acute pain of left knee M76.62 (ICD-10-CM) - Achilles tendinitis of left lower extremity   THERAPY DIAG:  Abnormal posture  Chronic pain of right knee  Difficulty in walking, not elsewhere classified  Muscle weakness (generalized)  Other abnormalities of gait and mobility  Unsteadiness on feet  PERTINENT HISTORY: A-fib, CHF (pt report), R fibula fx, L knee  TKA  FINDINGS: Surgical changes of total knee arthroplasty without evidence of hardware complication. No knee joint effusion. No lytic or blastic osseous lesion. Soft tissues are unremarkable in appearance.   IMPRESSION: Surgical changes of total knee arthroplasty without evidence of complication.    PRECAUTIONS: N/A  SUBJECTIVE: Has new shoes as recommended by land therapist.  Continues to feel unbalanced. No recent falls.  PAIN:  Are you having pain? No VAS scale: 0/10 Pain location: L knee  Pain orientation: Anterior  PAIN TYPE: dull ache   OBJECTIVE:   TODAY'S TREATMENT:   11/2 Pt seen for aquatic therapy today.  Treatment took place in water 3.25-4.8 ft in depth at the [facility] pool. Temp of water was 91.  Pt entered/exited the pool via stairs step to pattern independently with bilat rail. Warm up: forward, backward and side stepping/walking cues for increased step length, increased speed, hand placement to increase resistance.  Stretching: gastroc, hamstrings and adductors 3 x 20-25 sec hold   Standing  -hip flex and quad R/L stretch using noodle -Hip flex exercises x 10 R/L -toe raises x 20, heel raises x20 submerged 50% -Step ups on bottom step-moved to 4 ft due to left knee pain forward x 10 holding to wall for support - side stepping R/L x 10.  Pt reports some slight LB discomfort right - backward with hha R/L cues for execution: weight shift x 10  Kick board push downs 3 x 10 Kick board resisted core lateral rotations R/L 10 VC and demonstration for kick board exercises for proper execution and using target ms.  Pt requires buoyancy for support and to offload joints with strengthening exercises. Viscosity of the water is needed for resistance of strengthening; water current perturbations provides challenge to standing balance unsupported, requiring increased core activation.   10/24  Nu-step L2 4.5 min Leg press calf raise 25lbs  2x10  Standing HR with and without UE support 2x10 Woodpecker 3s hold 2x10 LAQ 5lbs bilat 2x10 each side  10/18 STM bilat lateral gastroc, post tib  Standing  stretch 30s 3x Gastroc stretch at slantboard 30s 2x Standing HR with and without UE support 2x10 Woodpecker 3s hold 2x10 LAQ 5lbs R 10lbs Seated knee extension machine 10x 15lbs double leg and review of how to set up in gym setting      PATIENT EDUCATION:  Education details: anatomy, exercise progression, DOMS expectations, muscle firing, acceptable levels of pain,  envelope of function, HEP, POC   Person educated: Patient Education method: Explanation, Demonstration, Tactile cues, Verbal cues, and Handouts Education comprehension: verbalized understanding and returned demonstration     HOME EXERCISE PROGRAM: Access Code: [email or web address] URL: [email or web address] Date: 10/28/2020 Prepared by: Zebedee Iba  Exercises Heel Raises with Counter Support - 1 x daily - 7 x weekly - 2 sets - 10 reps - 1 hold Woodpeckers Two Legs - 1 x daily - 7 x weekly - 1 sets - 15 reps - 3 hold Slant Board Gastrocnemius Stretch - 2 x daily - 7 x weekly - 1 sets - 3 reps - 30 hold Standing Quad Stretch with Strap - 2 x daily - 7 x weekly - 1 sets - 3 reps - 30 hold Seated Long Arc Quad with Ankle Weight - 1 x daily - 7 x weekly - 2 sets - 10 reps Calf Mobilization with Small Ball - 1 x daily - 7 x weekly - 1 sets - 1 reps - 5 min hold       ASSESSMENT:   CLINICAL IMPRESSION: Pt introduced to aquatic setting.  She has no apprehension and maneuvers through water indep. Pt directed through gentle calf and knee strengthening exercise in varying depths as tolerated.  Did need to move to ~ 60% submerged area to complete step ups for knee strengthening toleration.  She did not have any discomfort with gastroc/heel cord involved exercises submerged. I do believe she will progress with strength in setting. Will add balance challenges without  fear of falling  to improve safety and maximize functional mobility.     REHAB POTENTIAL: Good   CLINICAL DECISION MAKING: Stable/uncomplicated   EVALUATION COMPLEXITY: Low     GOALS:     SHORT TERM GOALS:   STG Name Target Date Goal status  1 Pt will become independent with HEP in order to demonstrate synthesis of PT education.   Baseline:  10/27/2020 INITIAL  2 Pt will be able to demonstrate ability to perform 10 HR without pain in order to demonstrate functional improvement in LE function for gait and house hold duties.   Baseline:  11/10/2020 INITIAL  3 Pt will have an at least 9 pt improvement in LEFS measure in order to demonstrate MCID improvement in daily function.   Baseline: 11/10/2020 INITIAL    LONG TERM GOALS:    LTG Name Target Date Goal status  1 Pt  will become independent with final HEP in order to demonstrate synthesis of PT education.   Baseline: 12/08/2020 INITIAL  2 Pt will be able to demonstrate/report ability to walk >20 mins without pain in order to demonstrate functional improvement and tolerance to exercise and community mobility.   Baseline: 12/08/2020 INITIAL    3 Pt will be able to demonstrate 5/5 MMT with L ankle PF and L knee extension in order to demonstrate functional improvement in LE function for daily mobility and house hold duties.  Baseline: 12/08/2020 INITIAL  4 Pt will have an at least 18 pt improvement in LEFS measure in order to demonstrate MCID improvement in daily function.  Baseline: 12/08/2020 INITIAL    PLAN: PT FREQUENCY: 1-2x/week   PT DURATION: 8 weeks   PLANNED INTERVENTIONS: Therapeutic exercises, Therapeutic activity, Neuro Muscular re-education, Balance training, Gait training, Patient/Family education, Joint mobilization, Stair training, Orthotic/Fit training, Aquatic Therapy, Dry Needling, Electrical stimulation, Spinal mobilization, Cryotherapy, Moist heat, scar mobilization, Taping, Vasopneumatic device, Traction,  Ultrasound, Ionotophoresis 4mg /ml Dexamethasone, and Manual therapy   PLAN FOR NEXT SESSION:   R knee ROM/stretching, calf strength, balance    Annamarie Major) Khy Pitre MPT 11/12/20 6:17 PM

## 2020-11-21 ENCOUNTER — Encounter (HOSPITAL_BASED_OUTPATIENT_CLINIC_OR_DEPARTMENT_OTHER): Payer: Self-pay

## 2020-11-21 NOTE — Telephone Encounter (Signed)
Please advise 

## 2020-11-24 ENCOUNTER — Encounter (HOSPITAL_BASED_OUTPATIENT_CLINIC_OR_DEPARTMENT_OTHER): Payer: Self-pay | Admitting: Physical Therapy

## 2020-11-24 ENCOUNTER — Ambulatory Visit (HOSPITAL_BASED_OUTPATIENT_CLINIC_OR_DEPARTMENT_OTHER): Payer: Medicare Other | Admitting: Physical Therapy

## 2020-11-24 ENCOUNTER — Other Ambulatory Visit: Payer: Self-pay

## 2020-11-24 DIAGNOSIS — G8929 Other chronic pain: Secondary | ICD-10-CM

## 2020-11-24 DIAGNOSIS — R2689 Other abnormalities of gait and mobility: Secondary | ICD-10-CM

## 2020-11-24 DIAGNOSIS — R262 Difficulty in walking, not elsewhere classified: Secondary | ICD-10-CM

## 2020-11-24 DIAGNOSIS — M25561 Pain in right knee: Secondary | ICD-10-CM | POA: Diagnosis not present

## 2020-11-24 DIAGNOSIS — M6281 Muscle weakness (generalized): Secondary | ICD-10-CM

## 2020-11-24 DIAGNOSIS — R293 Abnormal posture: Secondary | ICD-10-CM | POA: Diagnosis not present

## 2020-11-24 DIAGNOSIS — R2681 Unsteadiness on feet: Secondary | ICD-10-CM

## 2020-11-24 NOTE — Therapy (Signed)
OUTPATIENT PHYSICAL THERAPY TREATMENT NOTE   Patient Name: Jennifer Mcclain MRN: [id number] DOB:1945-05-14, 75 y.o., female Today's Date: 11/24/2020  PCP: Lauree Chandler, NP REFERRING PROVIDER: Lauree Chandler, NP   PT End of Session - 11/24/20 1110     Visit Number 6    Number of Visits 17    Date for PT Re-Evaluation 01/11/21    Authorization Type Medicare    PT Start Time 1114    PT Stop Time 1159    PT Time Calculation (min) 45 min    Equipment Utilized During Treatment Other (comment)   Ankle cuff and waist buoys, kick board, noodle/sqoodle, nekdoodle, weights and barbells, water walker   Activity Tolerance Patient tolerated treatment well;Patient limited by pain    Behavior During Therapy WFL for tasks assessed/performed              Past Medical History:  Diagnosis Date   Achilles tendinitis    Per [facility] Patient packet    Atrial fibrillation [facility])    Per records from [facility] in [place], [place]    Baker's cyst of knee, left    with resulting lower extremity edema   Benign cyst of breast, left    Per records from [facility] in [place], [place]. Has yearly U/S in the past    Cerebrovascular disease    Per [facility] New Patient packet    Chronic atrial fibrillation ([place])    Per [place] Patient packet    Chronic atrial fibrillation (HCC)    [name], MD   Decreased hearing, bilateral    referred to audiologist, Per records from [facility] in [place], [place]    Disorder of bone density and structure, unspecified    Per records from [facility] in [place], [place]    Disorder of carotid artery [facility])    [name], MD   Dyslipidemia    [name], MD   Elevated alkaline phosphatase level    Neg ative ANA 08/2013. Per records from [facility] in [place], [place]    H/O calculus of kidney during pregnancy    [name], MD   H/O hysterectomy for benign disease    [name], MD   History  of fibula fracture    Right, Per records from [facility] in [place], [place]    History of torn meniscus of right knee    Per records from [facility] in [place], [place]    Humerus fracture    left leg- due to fall   Humerus fracture    Resulting from a fall, Per records from [facility] in [place], [place]    Hypercalcemia    Per records from [facility] in [place], [place]    Hypertension    Per [facility] New Patient packet    Kidney stone on left side    Per records from [facility] in [place], [place]    Lactose intolerance    Per [facility] New Patient packet    Multiple actinic keratoses    [name], MD   Myalgia due to statin    Per records from [facility] in [place], [place]. Due to crestor    Non-toxic multinodular goiter 01/21/2017   [name], MD   Normocytic anemia    [name], MD   Obstructive sleep apnea syndrome    Per records from [facility] in [place], [place]. Patient did not tolerate CPAP machine  Onychomycosis    Per records from [facility] in [place], [place]    Osteoarthritis    Per [facility] New Patient packet    Osteopenia    Per records from [facility] in [place], [place]. Treated in the past with antiresorptive therapy   Renal cyst 10/11/2012   Simple on CT Scan 10/2012. Per records from [facility] in [place], [place]    Sensorineural hearing loss (SNHL), bilateral 01/28/2017   Lynden Ang , MD   Umbilical hernia    Per [facility] New Patient packet    Vitamin D deficiency    Lynden Ang, MD   Past Surgical History:  Procedure Laterality Date   ABDOMINAL HYSTERECTOMY  1989   One ovary, Per [facility] New Patient packet    ABLATION  1996   Radical, Per [facility] New Patient packet    BREAST CYST ASPIRATION Bilateral    numerous, over several years per pt, always benign   COLONOSCOPY  2019   Dr.Melissa Fayrene Fearing, Per [facility] New Patient packet    KNEE ARTHROSCOPY W/ MENISCAL REPAIR   2002   Per [facility] New Patient packet    MOUTH SURGERY     Dental Implant    REPLACEMENT TOTAL KNEE Left 2015   Per [facility] New Patient packet   RIGHT/LEFT HEART CATH AND CORONARY ANGIOGRAPHY N/A 02/07/2018   Procedure: RIGHT/LEFT HEART CATH AND CORONARY ANGIOGRAPHY;  Surgeon: Swaziland, Peter M, MD;  Location: [facility] INVASIVE CV LAB;  Service: Cardiovascular;  Laterality: N/A;   TONSILLECTOMY  1951   Per [facility] New Patient packet    TOOTH EXTRACTION  01/12/2019   TOOTH EXTRACTION  06/05/2019   Preparing for dental implant    Patient Active Problem List   Diagnosis Date Noted   Anticoagulant long-term use 05/04/2019   Chronic combined systolic and diastolic heart failure (HCC) 05/04/2019   Positive 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 09/15/2018   Pure hypercholesterolemia 06/14/2018   Coronary artery disease due to lipid rich plaque 03/02/2018   Systolic dysfunction without heart failure 02/02/2018   Nonischemic cardiomyopathy (HCC) 02/02/2018   Nonrheumatic mitral valve regurgitation 02/02/2018   Nonrheumatic tricuspid valve regurgitation 02/02/2018   Osteopenia 02/22/2017   Bilateral sensorineural hearing loss 01/28/2017   Vitamin D deficiency 01/21/2017   Osteoarthritis of knee 01/21/2017   Non-toxic multinodular goiter 01/21/2017   IFG (impaired fasting glucose) 01/21/2017   Lactose intolerance 01/21/2017   History of hysterectomy for benign disease 01/21/2017   Multiple actinic keratoses 01/21/2017   Normocytic anemia 01/21/2017   Umbilical hernia 01/21/2017   Essential hypertension 11/30/2016   Dyslipidemia 11/30/2016   Chronic atrial fibrillation (HCC) 11/30/2016   Obstructive sleep apnea syndrome 11/30/2016   Renal cyst 10/11/2012    REFERRING DIAG: M25.562 (ICD-10-CM) - Acute pain of left knee M76.62 (ICD-10-CM) - Achilles tendinitis of left lower extremity   THERAPY DIAG:  Abnormal posture  Chronic pain of right knee  Difficulty in walking, not elsewhere classified  Muscle weakness  (generalized)  Unsteadiness on feet  Other abnormalities of gait and mobility  PERTINENT HISTORY: A-fib, CHF (pt report), R fibula fx, L knee TKA  FINDINGS: Surgical changes of total knee arthroplasty without evidence of hardware complication. No knee joint effusion. No lytic or blastic osseous lesion. Soft tissues are unremarkable in appearance.   IMPRESSION: Surgical changes of total knee arthroplasty without evidence of complication.    PRECAUTIONS: N/A  SUBJECTIVE: Pt reports walking /shopping  (walking for ~ 45 min) on sat then went out  to dinner. Yesterday she reports she did have some discomfort at achilles itself which is the first time that has happened  PAIN:  Are you having pain? No VAS scale: 2/10  11/24/20 Pain location: L knee /left achilles Pain orientation: Anterior  PAIN TYPE: dull ache   OBJECTIVE:   TODAY'S TREATMENT:   11/14  Pt seen for aquatic therapy today.  Treatment took place in water 3.25-4.8 ft in depth at the [facility] pool. Temp of water was 91.  Pt entered/exited the pool via stairs step to pattern independently with bilat rail.  Warm up: forward, backward and side stepping/walking cues for increased step length, increased speed, hand placement to increase resistance.  Stretching: gastroc, hamstrings and adductors 3 x 20-25 sec hold seated Standing: gastroc stretch on bottom step    Standing  Using ankle buoys: -hip flex x12 -marching 2x10 -hip ext x12 -Hip add/abd 2x10 cues for tightened core -toe raises x 20, heel raises x20 submerged 50% -Step ups on bottom step-moved to 4 ft due to left knee pain forward x 10 holding to wall for support decreasing to unilat ue support for balance  challenge - side stepping R/L x 10.   -hip hinges 2x10  Seated STS from 3rd water step 2x8.  Vc and demo for proper execution/weight shifting  Balance -unilateral stand -standng on noodle -noodle kick downs R/L hip er and neutral x  10. Uisng hand buoys for support. Cues for tightened core throughout   Pt requires buoyancy for support and to offload joints with strengthening exercises. Viscosity of the water is needed for resistance of strengthening; water current perturbations provides challenge to standing balance unsupported, requiring increased core activation.  11/2 Pt seen for aquatic therapy today.  Treatment took place in water 3.25-4.8 ft in depth at the [facility] pool. Temp of water was 91.  Pt entered/exited the pool via stairs step to pattern independently with bilat rail. Warm up: forward, backward and side stepping/walking cues for increased step length, increased speed, hand placement to increase resistance.  Stretching: gastroc, hamstrings and adductors 3 x 20-25 sec hold   Standing  -hip flex and quad R/L stretch using noodle -Hip flex exercises x 10 R/L -toe raises x 20, heel raises x20 submerged 50% -Step ups on bottom step-moved to 4 ft due to left knee pain forward x 10 holding to wall for support - side stepping R/L x 10.  Pt reports some slight LB discomfort right - backward with hha R/L cues for execution: weight shift x 10  Kick board push downs 3 x 10 Kick board resisted core lateral rotations R/L 10 VC and demonstration for kick board exercises for proper execution and using target ms.  Pt requires buoyancy for support and to offload joints with strengthening exercises. Viscosity of the water is needed for resistance of strengthening; water current perturbations provides challenge to standing balance unsupported, requiring increased core activation.   10/24  Nu-step L2 4.5 min Leg press calf raise 25lbs 2x10  Standing HR with and without UE support 2x10 Woodpecker 3s hold 2x10 LAQ 5lbs bilat 2x10 each side  10/18 STM bilat lateral gastroc, post tib  Standing  stretch 30s 3x Gastroc stretch at slantboard 30s 2x Standing HR with and without UE support 2x10 Woodpecker 3s  hold 2x10 LAQ 5lbs R 10lbs Seated knee extension machine 10x 15lbs double leg and review of how to set up in gym setting      PATIENT EDUCATION:  Education  details: anatomy, exercise progression, DOMS expectations, muscle firing, acceptable levels of pain,  envelope of function, HEP, POC   Person educated: Patient Education method: Explanation, Demonstration, Tactile cues, Verbal cues, and Handouts Education comprehension: verbalized understanding and returned demonstration     HOME EXERCISE PROGRAM: Access Code: [email or web address] added aquatics 11/14 URL: [email or web address] Date: 10/28/2020 Prepared by: Daleen Bo  Exercises Heel Raises with Counter Support - 1 x daily - 7 x weekly - 2 sets - 10 reps - 1 hold Woodpeckers Two Legs - 1 x daily - 7 x weekly - 1 sets - 15 reps - 3 hold Slant Board Gastrocnemius Stretch - 2 x daily - 7 x weekly - 1 sets - 3 reps - 30 hold Standing Quad Stretch with Strap - 2 x daily - 7 x weekly - 1 sets - 3 reps - 30 hold Seated Long Arc Quad with Ankle Weight - 1 x daily - 7 x weekly - 2 sets - 10 reps Calf Mobilization with Small Ball - 1 x daily - 7 x weekly - 1 sets - 1 reps - 5 min hold       ASSESSMENT:   CLINICAL IMPRESSION: Pt with some discomfort today. Progressing with toleration to amb as noted with reported activity over weekend. Completes all prescribed exercises and challenges without issue. She does have slight fatigue in L knee towards end of session.  Added standing core strengthening and balance challenges to program. Pt without further visits scheduled at pool.  I do believe she will benefit from continued sessions     REHAB POTENTIAL: Good   CLINICAL DECISION MAKING: Stable/uncomplicated   EVALUATION COMPLEXITY: Low     GOALS:     SHORT TERM GOALS:   STG Name Target Date Goal status  1 Pt will become independent with HEP in order to demonstrate synthesis of PT education.   Baseline:  10/27/2020 INITIAL  2  Pt will be able to demonstrate ability to perform 10 HR without pain in order to demonstrate functional improvement in LE function for gait and house hold duties.   Baseline:  11/10/2020 INITIAL  3 Pt will have an at least 9 pt improvement in LEFS measure in order to demonstrate MCID improvement in daily function.   Baseline: 11/10/2020 INITIAL    LONG TERM GOALS:    LTG Name Target Date Goal status  1 Pt  will become independent with final HEP in order to demonstrate synthesis of PT education.   Baseline: 12/08/2020 INITIAL  2 Pt will be able to demonstrate/report ability to walk >20 mins without pain in order to demonstrate functional improvement and tolerance to exercise and community mobility.   Baseline: 12/08/2020 INITIAL    3 Pt will be able to demonstrate 5/5 MMT with L ankle PF and L knee extension in order to demonstrate functional improvement in LE function for daily mobility and house hold duties.  Baseline: 12/08/2020 INITIAL  4 Pt will have an at least 18 pt improvement in LEFS measure in order to demonstrate MCID improvement in daily function.  Baseline: 12/08/2020 INITIAL    PLAN: PT FREQUENCY: 1-2x/week   PT DURATION: 8 weeks   PLANNED INTERVENTIONS: Therapeutic exercises, Therapeutic activity, Neuro Muscular re-education, Balance training, Gait training, Patient/Family education, Joint mobilization, Stair training, Orthotic/Fit training, Aquatic Therapy, Dry Needling, Electrical stimulation, Spinal mobilization, Cryotherapy, Moist heat, scar mobilization, Taping, Vasopneumatic device, Traction, Ultrasound, Ionotophoresis 4mg /ml Dexamethasone, and Manual therapy   PLAN FOR NEXT SESSION:   On land  treatment.  Schedule more visits. Will need aquatic HEP (laminated?)    Stanton Kidney ([place]) Georgia Baria MPT 11/24/20 1:19 PM

## 2020-11-27 ENCOUNTER — Encounter (HOSPITAL_BASED_OUTPATIENT_CLINIC_OR_DEPARTMENT_OTHER): Payer: Self-pay

## 2020-11-27 ENCOUNTER — Ambulatory Visit (HOSPITAL_BASED_OUTPATIENT_CLINIC_OR_DEPARTMENT_OTHER): Payer: Medicare Other | Admitting: Physical Therapy

## 2020-12-02 ENCOUNTER — Encounter (HOSPITAL_BASED_OUTPATIENT_CLINIC_OR_DEPARTMENT_OTHER): Payer: Self-pay | Admitting: Physical Therapy

## 2020-12-02 ENCOUNTER — Other Ambulatory Visit: Payer: Self-pay

## 2020-12-02 ENCOUNTER — Ambulatory Visit (HOSPITAL_BASED_OUTPATIENT_CLINIC_OR_DEPARTMENT_OTHER): Payer: Medicare Other | Admitting: Physical Therapy

## 2020-12-02 DIAGNOSIS — M25561 Pain in right knee: Secondary | ICD-10-CM | POA: Diagnosis not present

## 2020-12-02 DIAGNOSIS — R293 Abnormal posture: Secondary | ICD-10-CM | POA: Diagnosis not present

## 2020-12-02 DIAGNOSIS — G8929 Other chronic pain: Secondary | ICD-10-CM

## 2020-12-02 DIAGNOSIS — R2681 Unsteadiness on feet: Secondary | ICD-10-CM

## 2020-12-02 DIAGNOSIS — R2689 Other abnormalities of gait and mobility: Secondary | ICD-10-CM

## 2020-12-02 DIAGNOSIS — R262 Difficulty in walking, not elsewhere classified: Secondary | ICD-10-CM | POA: Diagnosis not present

## 2020-12-02 DIAGNOSIS — M6281 Muscle weakness (generalized): Secondary | ICD-10-CM | POA: Diagnosis not present

## 2020-12-02 NOTE — Therapy (Signed)
OUTPATIENT PHYSICAL THERAPY TREATMENT NOTE   Patient Name: Attallah Karlsson MRN: [id number] DOB:02/25/1945, 75 y.o., female Today's Date: 12/02/2020  PCP: Lauree Chandler, NP REFERRING PROVIDER: Lauree Chandler, NP   PT End of Session - 12/02/20 [phone]     Visit Number 7    Number of Visits 17    Date for PT Re-Evaluation 01/11/21    Authorization Type Medicare    PT Start Time 0946    PT Stop Time 1030    PT Time Calculation (min) 44 min    Equipment Utilized During Treatment Other (comment)   Ankle cuff and waist buoys, kick board, noodle/sqoodle, nekdoodle, weights and barbells, water walker   Activity Tolerance Patient tolerated treatment well;Patient limited by pain    Behavior During Therapy WFL for tasks assessed/performed              Past Medical History:  Diagnosis Date   Achilles tendinitis    Per [facility] Patient packet    Atrial fibrillation [facility])    Per records from [facility] in [place], [place]    Baker's cyst of knee, left    with resulting lower extremity edema   Benign cyst of breast, left    Per records from [facility] in [place], [place]. Has yearly U/S in the past    Cerebrovascular disease    Per [facility] New Patient packet    Chronic atrial fibrillation ([place])    Per [facility] Patient packet    Chronic atrial fibrillation (HCC)    [name], MD   Decreased hearing, bilateral    referred to audiologist, Per records from [facility] in [place], [place]    Disorder of bone density and structure, unspecified    Per records from [facility] in [place], [place]    Disorder of carotid artery [facility])    [name], MD   Dyslipidemia    [name], MD   Elevated alkaline phosphatase level    Neg ative ANA 08/2013. Per records from [facility] in [place], [place]    H/O calculus of kidney during pregnancy    [name], MD   H/O hysterectomy for benign disease    [name], MD   History  of fibula fracture    Right, Per records from [facility] in [place], [place]    History of torn meniscus of right knee    Per records from [facility] in [place], [place]    Humerus fracture    left leg- due to fall   Humerus fracture    Resulting from a fall, Per records from [facility] in [place], [place]    Hypercalcemia    Per records from [facility] in [place], [place]    Hypertension    Per [facility] New Patient packet    Kidney stone on left side    Per records from [facility] in [place], [place]    Lactose intolerance    Per [facility] New Patient packet    Multiple actinic keratoses    [name], MD   Myalgia due to statin    Per records from [facility] in [place], [place]. Due to crestor    Non-toxic multinodular goiter 01/21/2017   [name], MD   Normocytic anemia    [name], MD   Obstructive sleep apnea syndrome    Per records from [facility] in [place], [place]. Patient did not tolerate CPAP machine  Onychomycosis    Per records from [facility] in [place], [place]    Osteoarthritis    Per [facility] New Patient packet    Osteopenia    Per records from [facility] in [place], [place]. Treated in the past with antiresorptive therapy   Renal cyst 10/11/2012   Simple on CT Scan 10/2012. Per records from [facility] in [place], [place]    Sensorineural hearing loss (SNHL), bilateral 01/28/2017   Lynden Ang , MD   Umbilical hernia    Per [facility] New Patient packet    Vitamin D deficiency    Lynden Ang, MD   Past Surgical History:  Procedure Laterality Date   ABDOMINAL HYSTERECTOMY  1989   One ovary, Per [facility] New Patient packet    ABLATION  1996   Radical, Per [facility] New Patient packet    BREAST CYST ASPIRATION Bilateral    numerous, over several years per pt, always benign   COLONOSCOPY  2019   Dr.Melissa Fayrene Fearing, Per [facility] New Patient packet    KNEE ARTHROSCOPY W/ MENISCAL REPAIR   2002   Per [facility] New Patient packet    MOUTH SURGERY     Dental Implant    REPLACEMENT TOTAL KNEE Left 2015   Per [facility] New Patient packet   RIGHT/LEFT HEART CATH AND CORONARY ANGIOGRAPHY N/A 02/07/2018   Procedure: RIGHT/LEFT HEART CATH AND CORONARY ANGIOGRAPHY;  Surgeon: Swaz[place], Peter M, MD;  Location: [facility] INVASIVE CV LAB;  Service: Cardiovascular;  Laterality: N/A;   TONSILLECTOMY  1951   Per [facility] New Patient packet    TOOTH EXTRACTION  01/12/2019   TOOTH EXTRACTION  06/05/2019   Preparing for dental implant    Patient Active Problem List   Diagnosis Date Noted   Anticoagulant long-term use 05/04/2019   Chronic combined systolic and diastolic heart failure (HCC) 05/04/2019   Positive 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 09/15/2018   Pure hypercholesterolemia 06/14/2018   Coronary artery disease due to lipid rich plaque 03/02/2018   Systolic dysfunction without heart failure 02/02/2018   Nonischemic cardiomyopathy (HCC) 02/02/2018   Nonrheumatic mitral valve regurgitation 02/02/2018   Nonrheumatic tricuspid valve regurgitation 02/02/2018   Osteopenia 02/22/2017   Bilateral sensorineural hearing loss 01/28/2017   Vitamin D deficiency 01/21/2017   Osteoarthritis of knee 01/21/2017   Non-toxic multinodular goiter 01/21/2017   IFG (impaired fasting glucose) 01/21/2017   Lactose intolerance 01/21/2017   History of hysterectomy for benign disease 01/21/2017   Multiple actinic keratoses 01/21/2017   Normocytic anemia 01/21/2017   Umbilical hernia 01/21/2017   Essential hypertension 11/30/2016   Dyslipidemia 11/30/2016   Chronic atrial fibrillation (HCC) 11/30/2016   Obstructive sleep apnea syndrome 11/30/2016   Renal cyst 10/11/2012    REFERRING DIAG: M25.562 (ICD-10-CM) - Acute pain of left knee M76.62 (ICD-10-CM) - Achilles tendinitis of left lower extremity   THERAPY DIAG:  Abnormal posture  Chronic pain of right knee  Difficulty in walking, not elsewhere classified  Muscle weakness  (generalized)  Unsteadiness on feet  Other abnormalities of gait and mobility  PERTINENT HISTORY: A-fib, CHF (pt report), R fibula fx, L knee TKA  FINDINGS: Surgical changes of total knee arthroplasty without evidence of hardware complication. No knee joint effusion. No lytic or blastic osseous lesion. Soft tissues are unremarkable in appearance.   IMPRESSION: Surgical changes of total knee arthroplasty without evidence of complication.    PRECAUTIONS: N/A  SUBJECTIVE: Pt reports walking /shopping  (walking for ~ 45 min) on sat then went out  to dinner. Yesterday she reports she did have some discomfort at achilles itself which is the first time that has happened  PAIN:  Are you having pain? No VAS scale: 2/10  11/24/20 Pain location: L knee /left achilles Pain orientation: Anterior  PAIN TYPE: dull ache   OBJECTIVE:   TODAY'S TREATMENT:   11/22 Pt seen for aquatic therapy today.  Treatment took place in water 3.25-4.8 ft in depth at the [facility] pool. Temp of water was 91.  Pt entered/exited the pool via stairs step to pattern independently with bilat rail.  Warm up: forward, backward and side stepping/walking cues for increased step length, increased speed, hand placement to increase resistance.  Seated Stretching: gastroc, hamstrings and adductors 3 x 20-25 sec hold seated Standing stretch: gastroc stretch on bottom step  Standing  exercises Long leg hip flex 2x10 R/L Long leg hip extension 2x10 R/L submerged 80% VC for tightened core -toe raises x 20, heel raises x20 on bottom water step pausing for gastroc stretch -squats on bottom water step cues for weight through heels 2 x 10  Seated STS from 3rd water step 2x10 minor use of ue to rise.  Vc for balance and weight shifting. Pt with improved execution and improved immediate standing balance  Balance -unilateral stance and tandem with vision then vision eliminated. SLS holding R/L > 10 seconds  after few tries. Vision eliminated begun with best try at 3 sec -noodle kick downs R/L hip er and neutral x 15. Pt able to gain position on noodle indep completing SLS. Holding to wall. Cues for tightened core Pt requires buoyancy for support and to offload joints with strengthening exercises. Viscosity of the water is needed for resistance of strengthening; water current perturbations provides challenge to standing balance unsupported, requiring increased core activation.   11/14 Pt seen for aquatic therapy today.  Treatment took place in water 3.25-4.8 ft in depth at the [facility] pool. Temp of water was 91.  Pt entered/exited the pool via stairs step to pattern independently with bilat rail.  Warm up: forward, backward and side stepping/walking cues for increased step length, increased speed, hand placement to increase resistance.  Stretching: gastroc, hamstrings and adductors 3 x 20-25 sec hold seated Standing: gastroc stretch on bottom step    Standing  Long leg hip flex 2x10 R/L Long leg hip extension 2x10 R/L submerged 80% VC for tightened core  -toe raises x 20, heel raises x20 on bottom water step pausing for gastroc stretch -Step ups on bottom step-moved to 4 ft due to left knee pain forward x 10 holding to wall for support decreasing to un   Seated STS from 3rd water step 2x10.  Vc for balance and weight shifting. Pt with improved execution. Continues to have minor LOB upon immediate tanding balance  Balance -unilateral stance and tandem with vision then eliminated -noodle kick downs R/L hip er and neutral x 15. Uisng hand buoys for support. Cues for tightened core throughout  Pt requires buoyancy for support and to offload joints with strengthening exercises. Viscosity of the water is needed for resistance of strengthening; water current perturbations provides challenge to standing balance unsupported, requiring increased core activation.       PATIENT  EDUCATION:  Education details: anatomy, exercise progression, DOMS expectations, muscle firing, acceptable levels of pain,  envelope of function, HEP, POC   Person educated: Patient Education method: Explanation, Demonstration, Tactile cues, Verbal cues, and Handouts Education comprehension: verbalized understanding and returned demonstration  HOME EXERCISE PROGRAM: Access Code: [email or web address] added aquatics 11/14 URL: [email or web address] Date: 10/28/2020 Prepared by: Daleen Bo  Exercises Heel Raises with Counter Support - 1 x daily - 7 x weekly - 2 sets - 10 reps - 1 hold Woodpeckers Two Legs - 1 x daily - 7 x weekly - 1 sets - 15 reps - 3 hold Slant Board Gastrocnemius Stretch - 2 x daily - 7 x weekly - 1 sets - 3 reps - 30 hold Standing Quad Stretch with Strap - 2 x daily - 7 x weekly - 1 sets - 3 reps - 30 hold Seated Long Arc Quad with Ankle Weight - 1 x daily - 7 x weekly - 2 sets - 10 reps Calf Mobilization with Small Ball - 1 x daily - 7 x weekly - 1 sets - 1 reps - 5 min hold       ASSESSMENT:   CLINICAL IMPRESSION:  Improvement in SLS holding R/L > 10 seconds. Progressed to vision eliminated. No complaints of pain today. Pt ambulates without antalgic gait into pool. Progressing in balance as demonstrated above. Will increase core strengthening to improve proximal strength     REHAB POTENTIAL: Good   CLINICAL DECISION MAKING: Stable/uncomplicated   EVALUATION COMPLEXITY: Low     GOALS:     SHORT TERM GOALS:   STG Name Target Date Goal status  1 Pt will become independent with HEP in order to demonstrate synthesis of PT education.   Baseline:  10/27/2020 INITIAL  2 Pt will be able to demonstrate ability to perform 10 HR without pain in order to demonstrate functional improvement in LE function for gait and house hold duties.   Baseline:  11/10/2020 INITIAL  3 Pt will have an at least 9 pt improvement in LEFS measure in order to demonstrate MCID  improvement in daily function.   Baseline: 11/10/2020 INITIAL    LONG TERM GOALS:    LTG Name Target Date Goal status  1 Pt  will become independent with final HEP in order to demonstrate synthesis of PT education.   Baseline: 12/08/2020 INITIAL  2 Pt will be able to demonstrate/report ability to walk >20 mins without pain in order to demonstrate functional improvement and tolerance to exercise and community mobility.   Baseline: 12/08/2020 INITIAL    3 Pt will be able to demonstrate 5/5 MMT with L ankle PF and L knee extension in order to demonstrate functional improvement in LE function for daily mobility and house hold duties.  Baseline: 12/08/2020 INITIAL  4 Pt will have an at least 18 pt improvement in LEFS measure in order to demonstrate MCID improvement in daily function.  Baseline: 12/08/2020 INITIAL    PLAN: PT FREQUENCY: 1-2x/week   PT DURATION: 8 weeks   PLANNED INTERVENTIONS: Therapeutic exercises, Therapeutic activity, Neuro Muscular re-education, Balance training, Gait training, Patient/Family education, Joint mobilization, Stair training, Orthotic/Fit training, Aquatic Therapy, Dry Needling, Electrical stimulation, Spinal mobilization, Cryotherapy, Moist heat, scar mobilization, Taping, Vasopneumatic device, Traction, Ultrasound, Ionotophoresis 4mg /ml Dexamethasone, and Manual therapy   PLAN FOR NEXT SESSION:   Address STG. Will need aquatic HEP (laminated?)    Stanton Kidney ([place]) Lizanne Erker MPT 12/02/20 1:44 PM

## 2020-12-08 ENCOUNTER — Encounter (HOSPITAL_BASED_OUTPATIENT_CLINIC_OR_DEPARTMENT_OTHER): Payer: Self-pay | Admitting: Physical Therapy

## 2020-12-08 ENCOUNTER — Other Ambulatory Visit: Payer: Self-pay

## 2020-12-08 ENCOUNTER — Ambulatory Visit (HOSPITAL_BASED_OUTPATIENT_CLINIC_OR_DEPARTMENT_OTHER): Payer: Medicare Other | Admitting: Physical Therapy

## 2020-12-08 DIAGNOSIS — R262 Difficulty in walking, not elsewhere classified: Secondary | ICD-10-CM | POA: Diagnosis not present

## 2020-12-08 DIAGNOSIS — M25561 Pain in right knee: Secondary | ICD-10-CM | POA: Diagnosis not present

## 2020-12-08 DIAGNOSIS — G8929 Other chronic pain: Secondary | ICD-10-CM | POA: Diagnosis not present

## 2020-12-08 DIAGNOSIS — M79672 Pain in left foot: Secondary | ICD-10-CM

## 2020-12-08 DIAGNOSIS — M25562 Pain in left knee: Secondary | ICD-10-CM

## 2020-12-08 DIAGNOSIS — M6281 Muscle weakness (generalized): Secondary | ICD-10-CM

## 2020-12-08 DIAGNOSIS — R2689 Other abnormalities of gait and mobility: Secondary | ICD-10-CM | POA: Diagnosis not present

## 2020-12-08 DIAGNOSIS — R293 Abnormal posture: Secondary | ICD-10-CM | POA: Diagnosis not present

## 2020-12-08 NOTE — Therapy (Signed)
OUTPATIENT PHYSICAL THERAPY TREATMENT NOTE   Patient Name: Jennifer Mcclain MRN: [id number] DOB:1945-03-02, 75 y.o., female Today's Date: 12/08/2020  PCP: Lauree Chandler, NP REFERRING PROVIDER: Lauree Chandler, NP   PT End of Session - 12/08/20 1214     Visit Number 8    Number of Visits 17    Date for PT Re-Evaluation 01/11/21    Authorization Type Medicare    PT Start Time 7672    PT Stop Time 0947    PT Time Calculation (min) 44 min    Equipment Utilized During Treatment Other (comment)   Ankle cuff and waist buoys, kick board, noodle/sqoodle, nekdoodle, weights and barbells, water walker   Activity Tolerance Patient tolerated treatment well;Patient limited by pain    Behavior During Therapy WFL for tasks assessed/performed              Past Medical History:  Diagnosis Date   Achilles tendinitis    Per [place] Patient packet    Atrial fibrillation [facility])    Per records from [facility] in [place], [place]    Baker's cyst of knee, left    with resulting lower extremity edema   Benign cyst of breast, left    Per records from [facility] in [place], [place]. Has yearly U/S in the past    Cerebrovascular disease    Per [facility] New Patient packet    Chronic atrial fibrillation ([place])    Per [place] Patient packet    Chronic atrial fibrillation (HCC)    [name], MD   Decreased hearing, bilateral    referred to audiologist, Per records from [facility] in [place], [place]    Disorder of bone density and structure, unspecified    Per records from [facility] in [place], [place]    Disorder of carotid artery [facility])    [name], MD   Dyslipidemia    [name], MD   Elevated alkaline phosphatase level    Neg ative ANA 08/2013. Per records from [facility] in [place], [place]    H/O calculus of kidney during pregnancy    [name], MD   H/O hysterectomy for benign disease    [name], MD   History  of fibula fracture    Right, Per records from [facility] in [place], [place]    History of torn meniscus of right knee    Per records from [facility] in [place], [place]    Humerus fracture    left leg- due to fall   Humerus fracture    Resulting from a fall, Per records from [facility] in [place], [place]    Hypercalcemia    Per records from [facility] in [place], [place]    Hypertension    Per [facility] New Patient packet    Kidney stone on left side    Per records from [facility] in [place], [place]    Lactose intolerance    Per [facility] New Patient packet    Multiple actinic keratoses    [name], MD   Myalgia due to statin    Per records from [facility] in [place], [place]. Due to crestor    Non-toxic multinodular goiter 01/21/2017   [name], MD   Normocytic anemia    [name], MD   Obstructive sleep apnea syndrome    Per records from [facility] in [place], [place]. Patient did not tolerate CPAP machine  Onychomycosis    Per records from [facility] in [place], [place]    Osteoarthritis    Per [facility] New Patient packet    Osteopenia    Per records from [facility] in [place], [place]. Treated in the past with antiresorptive therapy   Renal cyst 10/11/2012   Simple on CT Scan 10/2012. Per records from [facility] in [place], [place]    Sensorineural hearing loss (SNHL), bilateral 01/28/2017   [name] , MD   Umbilical hernia    Per [facility] New Patient packet    Vitamin D deficiency    [name], MD   Past Surgical History:  Procedure Laterality Date   ABDOMINAL HYSTERECTOMY  1989   One ovary, Per [place] New Patient packet    ABLATION  1996   Radical, Per [facility] New Patient packet    BREAST CYST ASPIRATION Bilateral    numerous, over several years per pt, always benign   COLONOSCOPY  2019   Dr.Melissa Jeneen Rinks, Per [facility] New Patient packet    KNEE ARTHROSCOPY W/ MENISCAL REPAIR   2002   Per [facility] New Patient packet    MOUTH SURGERY     Dental Implant    REPLACEMENT TOTAL KNEE Left 2015   Per [facility] New Patient packet   RIGHT/LEFT HEART CATH AND CORONARY ANGIOGRAPHY N/A 02/07/2018   Procedure: RIGHT/LEFT HEART CATH AND CORONARY ANGIOGRAPHY;  Surgeon: Martinique, Peter M, MD;  Location: [facility];  Service: Cardiovascular;  Laterality: N/A;   TONSILLECTOMY  1951   Per [place] Patient packet    TOOTH EXTRACTION  01/12/2019   TOOTH EXTRACTION  06/05/2019   Preparing for dental implant    Patient Active Problem List   Diagnosis Date Noted   Anticoagulant long-term use 05/04/2019   Chronic combined systolic and diastolic heart failure ([place]) 05/04/2019   Positive 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 09/15/2018   Pure hypercholesterolemia 06/14/2018   Coronary artery disease due to lipid rich plaque 54/98/2641   Systolic dysfunction without heart failure 02/02/2018   Nonischemic cardiomyopathy ([place]) 02/02/2018   Nonrheumatic mitral valve regurgitation 02/02/2018   Nonrheumatic tricuspid valve regurgitation 02/02/2018   Osteopenia 02/22/2017   Bilateral sensorineural hearing loss 01/28/2017   Vitamin D deficiency 01/21/2017   Osteoarthritis of knee 01/21/2017   Non-toxic multinodular goiter 01/21/2017   IFG (impaired fasting glucose) 01/21/2017   Lactose intolerance 01/21/2017   History of hysterectomy for benign disease 01/21/2017   Multiple actinic keratoses 01/21/2017   Normocytic anemia 58/30/9407   Umbilical hernia 68/08/8108   Essential hypertension 11/30/2016   Dyslipidemia 11/30/2016   Chronic atrial fibrillation ([place]) 11/30/2016   Obstructive sleep apnea syndrome 11/30/2016   Renal cyst 10/11/2012    REFERRING DIAG: M25.562 (ICD-10-CM) - Acute pain of left knee M76.62 (ICD-10-CM) - Achilles tendinitis of left lower extremity   THERAPY DIAG:  No diagnosis found.  PERTINENT HISTORY: A-fib, CHF (pt report), R fibula fx, L knee TKA  FINDINGS: Surgical  changes of total knee arthroplasty without evidence of hardware complication. No knee joint effusion. No lytic or blastic osseous lesion. Soft tissues are unremarkable in appearance.   IMPRESSION: Surgical changes of total knee arthroplasty without evidence of complication.    PRECAUTIONS: N/A  SUBJECTIVE: No complaints. Just some overall discomfort  PAIN:  Are you having pain? No VAS scale: 0-1/10  11//22 Pain location: L knee /left achilles Pain orientation: Anterior  PAIN TYPE: dull ache   OBJECTIVE:   TODAY'S TREATMENT:  11/22 Pt seen for aquatic therapy today.  Treatment took place in water 3.25-4.8 ft in depth at the [facility] pool. Temp of water was 91.  Pt entered/exited the pool via stairs step to pattern independently with bilat rail.  Warm up: forward, backward and side stepping/walking cues for increased step length, increased speed, hand placement to increase resistance.  Stretching: gastroc, hamstrings and adductors 3 x 20-25 sec hold seated Standing: gastroc stretch on bottom step    Standing  Noodle pushdowns 3x15 cues for tightened core throughout UE with yellow hand buoys Horizontal abd/add, shld flex/ex, shoulder add/abd x12 Long leg hip flex x15R/L Long leg hip extension x15 R/L Long leg add/abd x15 -cues for tightened core throughout   Seated STS from 3rd water step 2x10.  Vc for balance and weight shifting. Pt with improved execution. Continues to have minor LOB upon immediate tanding balance  Balance -noodle kick downs R/L hip er and neutral x 15. Uisng hand buoys for support. Cues for tightened core throughout  Pt requires buoyancy for support and to offload joints with strengthening exercises. Viscosity of the water is needed for resistance of strengthening; water current perturbations provides challenge to standing balance unsupported, requiring increased core activation.      11/14 Pt seen for aquatic therapy today.   Treatment took place in water 3.25-4.8 ft in depth at the [employer] pool. Temp of water was 91.  Pt entered/exited the pool via stairs step to pattern independently with bilat rail.  Warm up: forward, backward and side stepping/walking cues for increased step length, increased speed, hand placement to increase resistance.  Seated Stretching: gastroc, hamstrings and adductors 3 x 20-25 sec hold seated Standing stretch: gastroc stretch on bottom step  Standing  exercises Long leg hip flex 2x10 R/L Long leg hip extension 2x10 R/L submerged 80% VC for tightened core -toe raises x 20, heel raises x20 on bottom water step pausing for gastroc stretch -squats on bottom water step cues for weight through heels 2 x 10  Seated STS from 3rd water step 2x10 minor use of ue to rise.  Vc for using of momentum to assist in rising. LOB upon immediate standing balance x 1/20  Balance -unilateral stance and tandem with vision then vision eliminated. SLS holding R/L > 10 seconds after few tries. Vision eliminated begun with best try at 3 sec -noodle kick downs R/L hip er and neutral x 15. Pt able to gain position on noodle indep completing SLS. Holding to wall. Cues for tightened core Pt requires buoyancy for support and to offload joints with strengthening exercises. Viscosity of the water is needed for resistance of strengthening; water current perturbations provides challenge to standing balance unsupported, requiring increased core activation.        PATIENT EDUCATION:  Education details: anatomy, exercise progression, DOMS expectations, muscle firing, acceptable levels of pain,  envelope of function, HEP, POC   Person educated: Patient Education method: Explanation, Demonstration, Tactile cues, Verbal cues, and Handouts Education comprehension: verbalized understanding and returned demonstration     HOME EXERCISE PROGRAM: Access Code: [email or web address] added aquatics 11/14 URL:  [email or web address] Date: 10/28/2020 Prepared by: Daleen Bo  Exercises Heel Raises with Counter Support - 1 x daily - 7 x weekly - 2 sets - 10 reps - 1 hold Woodpeckers Two Legs - 1 x daily - 7 x weekly - 1 sets - 15 reps - 3 hold Slant Board Gastrocnemius Stretch - 2 x daily - 7 x weekly -  1 sets - 3 reps - 30 hold Standing Quad Stretch with Strap - 2 x daily - 7 x weekly - 1 sets - 3 reps - 30 hold Seated Long Arc Quad with Ankle Weight - 1 x daily - 7 x weekly - 2 sets - 10 reps Calf Mobilization with Small Ball - 1 x daily - 7 x weekly - 1 sets - 1 reps - 5 min hold       ASSESSMENT:   CLINICAL IMPRESSION:  Improvement in immediate standing balance as evidenced by sit to stand from 3rd water step. Overall pain has decreased. Working on Insurance account manager. STG met. Pt reports increased ability to amb without with less pain.     REHAB POTENTIAL: Good   CLINICAL DECISION MAKING: Stable/uncomplicated   EVALUATION COMPLEXITY: Low     GOALS:     SHORT TERM GOALS:   STG Name Target Date Goal status  1 Pt will become independent with HEP in order to demonstrate synthesis of PT education.   Baseline:  10/27/2020 INITIAL  2 Pt will be able to demonstrate ability to perform 10 HR without pain in order to demonstrate functional improvement in LE function for gait and house hold duties.   Baseline:  11/10/2020 INITIAL Goal met 11/28    3 Pt will have an at least 9 pt improvement in LEFS measure in order to demonstrate MCID improvement in daily function.   Baseline: 11/10/2020 INITIAL    LONG TERM GOALS:    LTG Name Target Date Goal status  1 Pt  will become independent with final HEP in order to demonstrate synthesis of PT education.   Baseline: 12/08/2020 INITIAL  2 Pt will be able to demonstrate/report ability to walk >20 mins without pain in order to demonstrate functional improvement and tolerance to exercise and community mobility.   Baseline:  12/08/2020 INITIAL    3 Pt will be able to demonstrate 5/5 MMT with L ankle PF and L knee extension in order to demonstrate functional improvement in LE function for daily mobility and house hold duties.  Baseline: 12/08/2020 INITIAL  4 Pt will have an at least 18 pt improvement in LEFS measure in order to demonstrate MCID improvement in daily function.  Baseline: 12/08/2020 INITIAL    PLAN: PT FREQUENCY: 1-2x/week   PT DURATION: 8 weeks   PLANNED INTERVENTIONS: Therapeutic exercises, Therapeutic activity, Neuro Muscular re-education, Balance training, Gait training, Patient/Family education, Joint mobilization, Stair training, Orthotic/Fit training, Aquatic Therapy, Dry Needling, Electrical stimulation, Spinal mobilization, Cryotherapy, Moist heat, scar mobilization, Taping, Vasopneumatic device, Traction, Ultrasound, Ionotophoresis 75m/ml Dexamethasone, and Manual therapy   PLAN FOR NEXT SESSION:   Address STG. Will need aquatic HEP printed and being laminated.    MStanton Kidney(Tharon Aquas Erum Cercone MPT 12/08/20 12:15 PM

## 2020-12-10 ENCOUNTER — Other Ambulatory Visit: Payer: Self-pay | Admitting: Cardiology

## 2020-12-15 ENCOUNTER — Other Ambulatory Visit: Payer: Self-pay | Admitting: Cardiology

## 2020-12-15 ENCOUNTER — Other Ambulatory Visit: Payer: Self-pay | Admitting: Nurse Practitioner

## 2020-12-15 DIAGNOSIS — I482 Chronic atrial fibrillation, unspecified: Secondary | ICD-10-CM

## 2020-12-15 DIAGNOSIS — I428 Other cardiomyopathies: Secondary | ICD-10-CM

## 2020-12-15 NOTE — Telephone Encounter (Signed)
Should be 100 mg in the AM and 50 mg in the PM, signature fixed

## 2020-12-16 ENCOUNTER — Other Ambulatory Visit: Payer: Self-pay

## 2020-12-16 ENCOUNTER — Ambulatory Visit (HOSPITAL_BASED_OUTPATIENT_CLINIC_OR_DEPARTMENT_OTHER): Payer: Medicare Other | Attending: Nurse Practitioner | Admitting: Physical Therapy

## 2020-12-16 ENCOUNTER — Encounter (HOSPITAL_BASED_OUTPATIENT_CLINIC_OR_DEPARTMENT_OTHER): Payer: Self-pay | Admitting: Physical Therapy

## 2020-12-16 DIAGNOSIS — M79672 Pain in left foot: Secondary | ICD-10-CM | POA: Diagnosis not present

## 2020-12-16 DIAGNOSIS — M6281 Muscle weakness (generalized): Secondary | ICD-10-CM | POA: Diagnosis not present

## 2020-12-16 DIAGNOSIS — G8929 Other chronic pain: Secondary | ICD-10-CM | POA: Insufficient documentation

## 2020-12-16 DIAGNOSIS — M25512 Pain in left shoulder: Secondary | ICD-10-CM | POA: Diagnosis not present

## 2020-12-16 DIAGNOSIS — R262 Difficulty in walking, not elsewhere classified: Secondary | ICD-10-CM | POA: Diagnosis not present

## 2020-12-16 NOTE — Therapy (Signed)
OUTPATIENT PHYSICAL THERAPY RE-EVALUATION NOTE   Patient Name: Jennifer Mcclain MRN: [id number] DOB:[birth year], 75 y.o., female Today's Date: 12/16/2020  PCP: Lauree Chandler, NP REFERRING PROVIDER: Lauree Chandler, NP   PT End of Session - 12/16/20 1256     Visit Number 9    Number of Visits 27    Date for PT Re-Evaluation 03/16/21    Authorization Type Medicare    PT Start Time 1300    PT Stop Time 6270    PT Time Calculation (min) 45 min    Equipment Utilized During Treatment Other (comment)   Ankle cuff and waist buoys, kick board, noodle/sqoodle, nekdoodle, weights and barbells, water walker   Activity Tolerance Patient tolerated treatment well;Patient limited by pain    Behavior During Therapy WFL for tasks assessed/performed               Past Medical History:  Diagnosis Date   Achilles tendinitis    Per [facility] Patient packet    Atrial fibrillation [facility])    Per records from [facility] in [place], [place]    Baker's cyst of knee, left    with resulting lower extremity edema   Benign cyst of breast, left    Per records from [facility] in [place], [place]. Has yearly U/S in the past    Cerebrovascular disease    Per [facility] New Patient packet    Chronic atrial fibrillation ([place])    Per [facility] Patient packet    Chronic atrial fibrillation (H[facility])    [name], MD   Decreased hearing, bilateral    referred to audiologist, Per records from [facility] in [place], [place]    Disorder of bone density and structure, unspecified    Per records from [facility] in [place], [place]    Disorder of carotid artery [facility])    [name], MD   Dyslipidemia    [name], MD   Elevated alkaline phosphatase level    Neg ative ANA 08/2013. Per records from [facility] in [place], [place]    H/O calculus of kidney during pregnancy    [name], MD   H/O hysterectomy for benign disease    [name], MD    History of fibula fracture    Right, Per records from [facility] in [place], [place]    History of torn meniscus of right knee    Per records from [facility] in [place], [place]    Humerus fracture    left leg- due to fall   Humerus fracture    Resulting from a fall, Per records from [facility] in [place], [place]    Hypercalcemia    Per records from [facility] in [place], [place]    Hypertension    Per [facility] New Patient packet    Kidney stone on left side    Per records from [facility] in [place], [place]    Lactose intolerance    Per [facility] New Patient packet    Multiple actinic keratoses    [name], MD   Myalgia due to statin    Per records from [facility] in [place], [place]. Due to crestor    Non-toxic multinodular goiter 01/21/2017   [name], MD   Normocytic anemia    [name], MD   Obstructive sleep apnea syndrome    Per records from [facility] in [place], [place]. Patient did not tolerate CPAP  machine    Onychomycosis    Per records from [facility] in [place], [place]    Per [facility] New Patient packet    Osteopenia    Per records from [facility] in [place], [place]. Treated in the past with antiresorptive therapy   Renal cyst 10/11/2012   Simple on CT Scan 10/2012. Per records from [facility] in [place], [place]    Sensorineural hearing loss (SNHL), bilateral 01/28/2017   [name] , MD   Umbilical hernia    Per [facility] New Patient packet    Vitamin D deficiency    [name], MD   Past Surgical History:  Procedure Laterality Date   ABDOMINAL HYSTERECTOMY  1989   One ovary, Per [facility] New Patient packet    ABLATION  1996   Radical, Per [facility] New Patient packet    BREAST CYST ASPIRATION Bilateral    numerous, over several years per pt, always benign   COLONOSCOPY  2019   Dr.Melissa Jeneen Rinks, Per [facility] New Patient packet    KNEE ARTHROSCOPY W/ MENISCAL  REPAIR  2002   Per [facility] New Patient packet    MOUTH SURGERY     Dental Implant    REPLACEMENT TOTAL KNEE Left 2015   Per [facility] New Patient packet   RIGHT/LEFT HEART CATH AND CORONARY ANGIOGRAPHY N/A 02/07/2018   Procedure: RIGHT/LEFT HEART CATH AND CORONARY ANGIOGRAPHY;  Surgeon: Martinique, Peter M, MD;  Location: [facility];  Service: Cardiovascular;  Laterality: N/A;   TONSILLECTOMY  1951   Per [place] [facility] Patient packet    TOOTH EXTRACTION  01/12/2019   TOOTH EXTRACTION  06/05/2019   Preparing for dental implant    Patient Active Problem List   Diagnosis Date Noted   Anticoagulant long-term use 05/04/2019   Chronic combined systolic and diastolic heart failure ([place]) 05/04/2019   Positive 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 09/15/2018   Pure hypercholesterolemia 06/14/2018   Coronary artery disease due to lipid rich plaque 83/38/2505   Systolic dysfunction without heart failure 02/02/2018   Nonischemic cardiomyopathy ([place]) 02/02/2018   Nonrheumatic mitral valve regurgitation 02/02/2018   Nonrheumatic tricuspid valve regurgitation 02/02/2018   Osteopenia 02/22/2017   Bilateral sensorineural hearing loss 01/28/2017   Vitamin D deficiency 01/21/2017   Osteoarthritis of knee 01/21/2017   Non-toxic multinodular goiter 01/21/2017   IFG (impaired fasting glucose) 01/21/2017   Lactose intolerance 01/21/2017   History of hysterectomy for benign disease 01/21/2017   Multiple actinic keratoses 01/21/2017   Normocytic anemia 39/76/7341   Umbilical hernia 93/79/0240   Essential hypertension 11/30/2016   Dyslipidemia 11/30/2016   Chronic atrial fibrillation ([place]) 11/30/2016   Obstructive sleep apnea syndrome 11/30/2016   Renal cyst 10/11/2012    REFERRING DIAG: M25.562 (ICD-10-CM) - Acute pain of left knee M76.62 (ICD-10-CM) - Achilles tendinitis of left lower extremity   THERAPY DIAG:  Pain of left heel  Difficulty walking  Muscle weakness (generalized)  Chronic left shoulder  pain  PERTINENT HISTORY: A-fib, CHF (pt report), R fibula fx, L knee TKA, 2000 prox humerus fracture, adhesive capsuilitis  FINDINGS: Surgical changes of total knee arthroplasty without evidence of hardware complication. No knee joint effusion. No lytic or blastic osseous lesion. Soft tissues are unremarkable in appearance.   IMPRESSION: Surgical changes of total knee arthroplasty without evidence of complication.    PRECAUTIONS: N/A  SUBJECTIVE: Pt states the ankle pain is a little more sore today. She does feel her balance has improved and  she is able to stand from chairs easier.  Pt states the shoulder pain mostly happens when she goes to bed. She states that with reaching it will pull and "kink." She states she has to sleep with a pillow under the L arm. She states that weather may play a part. Pt states she has had the L shoulder pain about 5 years. She states it got worse in the last year. 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 R handed. Pt states the pain will come on and goes away with rest. "It feels weak." Pt denies NT or pain with C/S motions.    PAIN:  Are you having pain?  VAS scale: 0/10 Pain location: L lateral PAIN TYPE: pulling/sharp  Agg: reaching, horizontal ABD, reaching/lifting  Eases:   OBJECTIVE:   SPADI: Total Disability Score: 21 / 80 = 26.3 % LEFS: 47/80 points (or 58.75%) (5 pt improvement)   UPPER EXTREMITY AROM/PROM:  A/PROM Right 12/16/2020 Left 12/16/2020  Shoulder flexion 149 110  Shoulder extension [facility]  Shoulder abduction 120 120  Shoulder adduction    Shoulder internal rotation [facility]  Shoulder external rotation 55 53  (Blank rows = not tested)  UPPER EXTREMITY MMT:  MMT Right 12/16/2020 Left 12/16/2020  Shoulder flexion 4+/5 4/5  Shoulder IR 4+/5 4/5  Shoulder ER 4+/5 4/5  Abduction 4+/5 4/5  (Blank rows = not tested)  LE AROM/PROM:   A/PROM Right 10/13/2020 Left 10/13/2020  Knee  flexion 125 120  Knee extension -5 0  Ankle dorsiflexion 0 0  Ankle plantarflexion WFL WFL   (Blank rows = not tested)   LE MMT:   MMT Right 10/13/2020 Left 10/13/2020  Ankle dorsiflexion 5/5 5/5  Ankle plantarflexion 4+/5 4+/5    SHOULDER SPECIAL TESTS:  Impingement tests: Neer impingement test: negative, Hawkins/Kennedy impingement test: positive , and Painful arc test: positive   SLAP lesions: Biceps load test: negative  Instability tests: Apprehension test: negative  Rotator cuff assessment: Drop arm test: negative, Empty can test: negative, and Belly press test: negative  Biceps assessment: Speed's test: positive   JOINT MOBILITY TESTING:  WFL of L shoulder  PALPATION:  TTP of L biceps, ant deltoid, pec  POSTURE:  Moderate kyphosis and rounded shoulders     TODAY'S TREATMENT:  12/6 (SHOULDER EVAL AND TREAT)    Manual: STM L biceps muscle belly, deltoid, pec   Doorway Pec Stretch at 60 Degrees Abduction with Arm Straight - 1 x daily - 7 x weekly - 1 sets - 3 reps - 30 hold Bicep Stretch at Table - 1 x daily - 7 x weekly - 1 sets - 3 reps - 30 hold Shoulder External Rotation and Scapular Retraction with Resistance - 1 x daily - 7 x weekly - 2 sets - 10 reps      PATIENT EDUCATION:  Education details: anatomy, exercise progression, DOMS expectations, muscle firing, acceptable levels of pain,  envelope of function, HEP, POC   Person educated: Patient Education method: Explanation, Demonstration, Tactile cues, Verbal cues, and Handouts Education comprehension: verbalized understanding and returned demonstration     HOME EXERCISE PROGRAM: Access Code: [email or web address] URL: [email or web address] Date: 12/16/2020 Prepared by: Daleen Bo  Exercises Woodpeckers Two Legs - 1 x daily - 7 x weekly - 1 sets - 15 reps - 3 hold Slant Board Gastrocnemius Stretch - 2 x daily - 7 x weekly - 1 sets - 3 reps - 30 hold Standing  Sports administrator with Strap - 2 x daily - 7 x  weekly - 1 sets - 3 reps - 30 hold Seated Long Arc Quad with Ankle Weight - 1 x daily - 7 x weekly - 2 sets - 10 reps Heel Toe Raises at [place] - 1 x daily - 7 x weekly - 3 sets - 10 reps Standing Hip Flexion Extension at [employer] - 1 x daily - 7 x weekly - 3 sets - 10 reps Standing Hip Abduction Adduction at [employer] - 1 x daily - 7 x weekly - 3 sets - 10 reps Standing Knee Flexion - 1 x daily - 7 x weekly - 3 sets - 10 reps Doorway Pec Stretch at 60 Degrees Abduction with Arm Straight - 1 x daily - 7 x weekly - 1 sets - 3 reps - 30 hold Bicep Stretch at Table - 1 x daily - 7 x weekly - 1 sets - 3 reps - 30 hold Shoulder External Rotation and Scapular Retraction with Resistance - 1 x daily - 7 x weekly - 2 sets - 10 reps        ASSESSMENT:   CLINICAL IMPRESSION: Pt with improvement in bilateral LE strength as compared to previous session as well with slight improvement in self reported measure. Pt able to perform HR and toe off at this time without increased pain though stiffness still present. Pt's L shoulder pain appears consistent with pain soft tissue restriction due to muscle weakness from previous history of proximal humerus fracture. Pt does not appear to have a capsular pattern restriction. Pt is limited largely by soft tissue as well as strength. HEP updated and pt to continue with UE and LE exercise in the aquatic setting. Pt would benefit from continued skilled therapy in order to reach goals and maximize functional UE and LE strength and ROM for prevention of further functional decline.     REHAB POTENTIAL: Good   CLINICAL DECISION MAKING: Stable/uncomplicated   EVALUATION COMPLEXITY: Low     GOALS:     SHORT TERM GOALS:   STG Name Target Date Goal status  1 Pt will become independent with HEP in order to demonstrate synthesis of PT education.    10/27/2020 MET  2 Pt will be able to demonstrate ability to perform 10 HR without pain in order to demonstrate  functional improvement in LE function for gait and house hold duties.   11/10/2020 MET  3 Pt will have an at least 9 pt improvement in LEFS measure in order to demonstrate MCID improvement in daily function.   11/10/2020 Partially met    LONG TERM GOALS:    LTG Name Target Date Goal status  1 Pt  will become independent with final HEP in order to demonstrate synthesis of PT education.   Baseline: 12/08/2020 ongoing  2 Pt will be able to demonstrate/report ability to walk >20 mins without pain in order to demonstrate functional improvement and tolerance to exercise and community mobility.   Baseline: 12/08/2020 Partially met  3 Pt will be able to demonstrate 5/5 MMT with L ankle PF and L knee extension in order to demonstrate functional improvement in LE function for daily mobility and house hold duties.  Baseline: 12/08/2020 Partially met  4 Pt will have an at least 18 pt improvement in LEFS measure in order to demonstrate MCID improvement in daily function.  Baseline: 12/08/2020 ongoing  5 Pt will demonstrate at least a 15 pt improvement in  SPADI in order to demonstrate a clinically significant change in shoulder pain and function  01/13/2021  INITIAL    PLAN: PT FREQUENCY: 1-2x/week   PT DURATION: 8 weeks   PLANNED INTERVENTIONS: Therapeutic exercises, Therapeutic activity, Neuro Muscular re-education, Balance training, Gait training, Patient/Family education, Joint mobilization, Stair training, Orthotic/Fit training, Aquatic Therapy, Dry Needling, Electrical stimulation, Spinal mobilization, Cryotherapy, Moist heat, scar mobilization, Taping, Vasopneumatic device, Traction, Ultrasound, Ionotophoresis 40m/ml Dexamethasone, and Manual therapy   PLAN FOR NEXT SESSION: aquatics for LE strength, add in L shoulder ROM and cuff strength    ADaleen BoPT, DPT 12/16/20 5:14 PM

## 2020-12-22 ENCOUNTER — Other Ambulatory Visit: Payer: Self-pay | Admitting: Cardiology

## 2020-12-24 ENCOUNTER — Other Ambulatory Visit: Payer: Self-pay

## 2020-12-24 ENCOUNTER — Ambulatory Visit (HOSPITAL_BASED_OUTPATIENT_CLINIC_OR_DEPARTMENT_OTHER): Payer: Medicare Other | Admitting: Physical Therapy

## 2020-12-24 ENCOUNTER — Encounter (HOSPITAL_BASED_OUTPATIENT_CLINIC_OR_DEPARTMENT_OTHER): Payer: Self-pay | Admitting: Physical Therapy

## 2020-12-24 DIAGNOSIS — G8929 Other chronic pain: Secondary | ICD-10-CM | POA: Diagnosis not present

## 2020-12-24 DIAGNOSIS — R262 Difficulty in walking, not elsewhere classified: Secondary | ICD-10-CM | POA: Diagnosis not present

## 2020-12-24 DIAGNOSIS — M6281 Muscle weakness (generalized): Secondary | ICD-10-CM

## 2020-12-24 DIAGNOSIS — M25512 Pain in left shoulder: Secondary | ICD-10-CM | POA: Diagnosis not present

## 2020-12-24 DIAGNOSIS — M79672 Pain in left foot: Secondary | ICD-10-CM

## 2020-12-24 NOTE — Therapy (Signed)
OUTPATIENT PHYSICAL THERAPY RE-EVALUATION NOTE Progress Note Reporting Period 10/13/20 to 12/24/20  See note below for Objective Data and Assessment of Progress/Goals.      Patient Name: Jennifer Mcclain MRN: [id number] DOB:Aug 11, 1945, 75 y.o., female Today's Date: 12/24/2020  PCP: [name], NP REFERRING PROVIDER: [name], NP   PT End of Session - 12/24/20 1549     Visit Number 10    Number of Visits 27    Date for PT Re-Evaluation 03/16/21    Authorization Type Medicare    PT Start Time 1546    PT Stop Time 1630    PT Time Calculation (min) 44 min    Equipment Utilized During Treatment Other (comment)   Ankle cuff and waist buoys, kick board, noodle/sqoodle, nekdoodle, weights and barbells, water walker   Activity Tolerance Patient tolerated treatment well;Patient limited by pain    Behavior During Therapy WFL for tasks assessed/performed               Past Medical History:  Diagnosis Date   Achilles tendinitis    Per [facility] New Patient packet    Atrial fibrillation [facility])    Per records from [facility] in [place], [place]    Baker's cyst of knee, left    with resulting lower extremity edema   Benign cyst of breast, left    Per records from [facility] in [place], [place]. Has yearly U/S in the past    Cerebrovascular disease    Per [facility] New Patient packet    Chronic atrial fibrillation (HCC)    Per [facility] New Patient packet    Chronic atrial fibrillation (HCC)    [name], MD   Decreased hearing, bilateral    referred to audiologist, Per records from [facility] in [place], [place]    Disorder of bone density and structure, unspecified    Per records from [facility] in [place], [place]    Disorder of carotid artery [facility])    Lynden Ang, MD   Dyslipidemia    [name], MD   Elevated alkaline phosphatase level    Neg ative ANA 08/2013. Per records from [facility] in [place], [place]     H/O calculus of kidney during pregnancy    Lynden Ang, MD   H/O hysterectomy for benign disease    Lynden Ang, MD   History of fibula fracture    Right, Per records from [facility] in [place], [place]    History of torn meniscus of right knee    Per records from [facility] in [place], [place]    Humerus fracture    left leg- due to fall   Humerus fracture    Resulting from a fall, Per records from [facility] in [place], [place]    Hypercalcemia    Per records from [facility] in [place], [place]    Hypertension    Per [facility] New Patient packet    Kidney stone on left side    Per records from [facility] in [place], [place]    Lactose intolerance    Per [facility] New Patient packet    Multiple actinic keratoses    Lynden Ang, MD   Myalgia due to statin    Per records from [facility] in [place], [place]. Due to crestor    Non-toxic multinodular goiter 01/21/2017   Lynden Ang, MD   Normocytic anemia    Lynden Ang, MD  Obstructive sleep apnea syndrome    Per records from [facility] in [place], [place]. Patient did not tolerate CPAP machine    Onychomycosis    Per records from [facility] in [place], [place]    Per [facility] New Patient packet    Osteopenia    Per records from [facility] in [place], [place]. Treated in the past with antiresorptive therapy   Renal cyst 10/11/2012   Simple on CT Scan 10/2012. Per records from [facility] in [place], [place]    Sensorineural hearing loss (SNHL), bilateral 01/28/2017   [name] , MD   Umbilical hernia    Per [facility] New Patient packet    Vitamin D deficiency    [name], MD   Past Surgical History:  Procedure Laterality Date   ABDOMINAL HYSTERECTOMY  1989   One ovary, Per [facility] New Patient packet    ABLATION  1996   Radical, Per [facility] New Patient packet    BREAST CYST ASPIRATION Bilateral    numerous, over several  years per pt, always benign   COLONOSCOPY  2019   Dr.Melissa Jeneen Rinks, Per [facility] New Patient packet    KNEE ARTHROSCOPY W/ MENISCAL REPAIR  2002   Per [facility] New Patient packet    MOUTH SURGERY     Dental Implant    REPLACEMENT TOTAL KNEE Left 2015   Per [facility] New Patient packet   RIGHT/LEFT HEART CATH AND CORONARY ANGIOGRAPHY N/A 02/07/2018   Procedure: RIGHT/LEFT HEART CATH AND CORONARY ANGIOGRAPHY;  Surgeon: Martinique, Peter M, MD;  Location: [facility];  Service: Cardiovascular;  Laterality: N/A;   TONSILLECTOMY  1951   Per [facility] Patient packet    TOOTH EXTRACTION  01/12/2019   TOOTH EXTRACTION  06/05/2019   Preparing for dental implant    Patient Active Problem List   Diagnosis Date Noted   Anticoagulant long-term use 05/04/2019   Chronic combined systolic and diastolic heart failure ([place]) 05/04/2019   Positive 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 09/15/2018   Pure hypercholesterolemia 06/14/2018   Coronary artery disease due to lipid rich plaque 10/93/2355   Systolic dysfunction without heart failure 02/02/2018   Nonischemic cardiomyopathy ([place]) 02/02/2018   Nonrheumatic mitral valve regurgitation 02/02/2018   Nonrheumatic tricuspid valve regurgitation 02/02/2018   Osteopenia 02/22/2017   Bilateral sensorineural hearing loss 01/28/2017   Vitamin D deficiency 01/21/2017   Osteoarthritis of knee 01/21/2017   Non-toxic multinodular goiter 01/21/2017   IFG (impaired fasting glucose) 01/21/2017   Lactose intolerance 01/21/2017   History of hysterectomy for benign disease 01/21/2017   Multiple actinic keratoses 01/21/2017   Normocytic anemia 73/22/0254   Umbilical hernia 27/06/2374   Essential hypertension 11/30/2016   Dyslipidemia 11/30/2016   Chronic atrial fibrillation ([place]) 11/30/2016   Obstructive sleep apnea syndrome 11/30/2016   Renal cyst 10/11/2012    REFERRING DIAG: M25.562 (ICD-10-CM) - Acute pain of left knee M76.62 (ICD-10-CM) - Achilles tendinitis of left lower extremity    THERAPY DIAG:  Pain of left heel  Difficulty walking  Muscle weakness (generalized)  Chronic left shoulder pain  PERTINENT HISTORY: A-fib, CHF (pt report), R fibula fx, L knee TKA, 2000 prox humerus fracture, adhesive capsuilitis  FINDINGS: Surgical changes of total knee arthroplasty without evidence of hardware complication. No knee joint effusion. No lytic or blastic osseous lesion. Soft tissues are unremarkable in appearance.   IMPRESSION: Surgical changes of total knee arthroplasty without evidence of complication.    PRECAUTIONS: N/A  SUBJECTIVE: Pt reporting she gets out of breath before her body tires or hurts when exerting. Left  ankle pain low but a little swollen      PAIN:  Are you having pain?  VAS scale: 0/10 Pain location: L lateral PAIN TYPE: pulling/sharp  Agg: reaching, horizontal ABD, reaching/lifting  Eases:   OBJECTIVE:   SPADI: Total Disability Score: 21 / 80 = 26.3 % LEFS: 47/80 points (or 58.75%) (5 pt improvement)   UPPER EXTREMITY AROM/PROM:  A/PROM Right 12/24/2020 Left 12/24/2020  Shoulder flexion 149 110  Shoulder extension [facility]  Shoulder abduction 120 120  Shoulder adduction    Shoulder internal rotation [facility]  Shoulder external rotation 55 53  (Blank rows = not tested)  UPPER EXTREMITY MMT:  MMT Right 12/24/2020 Left 12/24/2020 Left   Shoulder flexion 4+/5 4/5   Shoulder IR 4+/5 4/5   Shoulder ER 4+/5 4/5   Abduction 4+/5 4/5   (Blank rows = not tested)  LE AROM/PROM:   A/PROM Right 10/13/2020 Left 10/13/2020  Knee flexion 125 120  Knee extension -5 0  Ankle dorsiflexion 0 0  Ankle plantarflexion WFL WFL   (Blank rows = not tested)   LE MMT:   MMT Right 10/13/2020 Left 10/13/2020 Right 12/24/2020 Left 12/24/2020  Ankle dorsiflexion 5/5 5/5    Ankle plantarflexion 4+/5 4+/5 5-/5 5-/5    SHOULDER SPECIAL TESTS:  Impingement tests: Neer impingement test: negative, Hawkins/Kennedy impingement  test: positive , and Painful arc test: positive   SLAP lesions: Biceps load test: negative  Instability tests: Apprehension test: negative  Rotator cuff assessment: Drop arm test: negative, Empty can test: negative, and Belly press test: negative  Biceps assessment: Speed's test: positive   JOINT MOBILITY TESTING:  WFL of L shoulder  PALPATION:  TTP of L biceps, ant deltoid, pec  POSTURE:  Moderate kyphosis and rounded shoulders     TODAY'S TREATMENT: 12/14 Pt seen for aquatic therapy today.  Treatment took place in water 3.25-4.8 ft in depth at the [facility] pool. Temp of water was 91.  Pt entered/exited the pool via stairs step to pattern independently with bilat rail.   Warm up: forward, backward and side stepping/walking cues for increased step length, increased speed, hand placement to increase resistance.   Stretching: gastroc, hamstrings and adductors 3 x 20-25 sec hold seated  Seated STS from 3rd water step 2x10.  Vc for immediate standing balance. Pt without LOB Flutter at hip 3 x 20 Cycling 3 x20 revolution  Standing  UE using 1 foam hand buoy horizontal add/abd; shoulder flex/ext; add/abd and elbow flex/ext x12 Long leg hip flex x12 R/L Long leg hip extension x12 R/L Long leg add/abd x12 Squats 2x10 pool bottom then bottom step   -cues for tightened core throughout   Pt requires buoyancy for support and to offload joints with strengthening exercises. Viscosity of the water is needed for resistance of strengthening; water current perturbations provides challenge to standing balance unsupported, requiring increased core activation.      PATIENT EDUCATION:  Education details: anatomy, exercise progression, DOMS expectations, muscle firing, acceptable levels of pain,  envelope of function, HEP, POC   Person educated: Patient Education method: Explanation, Demonstration, Tactile cues, Verbal cues, and Handouts Education comprehension: verbalized  understanding and returned demonstration     HOME EXERCISE PROGRAM: Access Code: [email or web address] URL: [email or web address] Date: 12/16/2020 Prepared by: Daleen Bo  Exercises Woodpeckers Two Legs - 1 x daily - 7 x weekly - 1 sets -  15 reps - 3 hold Slant Board Gastrocnemius Stretch - 2 x daily - 7 x weekly - 1 sets - 3 reps - 30 hold Standing Quad Stretch with Strap - 2 x daily - 7 x weekly - 1 sets - 3 reps - 30 hold Seated Long Arc Quad with Ankle Weight - 1 x daily - 7 x weekly - 2 sets - 10 reps Heel Toe Raises at [employer] - 1 x daily - 7 x weekly - 3 sets - 10 reps Standing Hip Flexion Extension at [employer] - 1 x daily - 7 x weekly - 3 sets - 10 reps Standing Hip Abduction Adduction at [employer] - 1 x daily - 7 x weekly - 3 sets - 10 reps Standing Knee Flexion - 1 x daily - 7 x weekly - 3 sets - 10 reps Doorway Pec Stretch at 60 Degrees Abduction with Arm Straight - 1 x daily - 7 x weekly - 1 sets - 3 reps - 30 hold Bicep Stretch at Table - 1 x daily - 7 x weekly - 1 sets - 3 reps - 30 hold Shoulder External Rotation and Scapular Retraction with Resistance - 1 x daily - 7 x weekly - 2 sets - 10 reps        ASSESSMENT:   CLINICAL IMPRESSION: Pt reporting limitations in amb time  to heart failure rather than LE dysfunction. She has met amb goal, able to amb >20 minutes with grocery shopping but would be difficult to walk continuously x 20 minutes due to heart failure. Decreased left ankle discomfort today due to decreased activity.  Right shoulder without initial improvement after last session on land.  She did complete series of shoulder and scapular exercises in pool without discomfort.  She has progressed meeting goals with improved strength and ROM of LE (see assessment).Pt would benefit from continued aquatic therapy in order to reach goals and maximize functional UE and LE strength and ROM for prevention of further functional decline.     REHAB POTENTIAL: Good    CLINICAL DECISION MAKING: Stable/uncomplicated   EVALUATION COMPLEXITY: Low     GOALS:     SHORT TERM GOALS:   STG Name Target Date Goal status  1 Pt will become independent with HEP in order to demonstrate synthesis of PT education.    10/27/2020 MET  2 Pt will be able to demonstrate ability to perform 10 HR without pain in order to demonstrate functional improvement in LE function for gait and house hold duties.   11/10/2020 MET  3 Pt will have an at least 9 pt improvement in LEFS measure in order to demonstrate MCID improvement in daily function.   11/10/2020 Partially met    LONG TERM GOALS:    LTG Name Target Date Goal status  1 Pt  will become independent with final HEP in order to demonstrate synthesis of PT education.   Baseline: 12/08/2020 ongoing  2 Pt will be able to demonstrate/report ability to walk >20 mins without pain in order to demonstrate functional improvement and tolerance to exercise and community mobility.   Baseline: 12/08/2020 12/14 Partially met Ache[place]  3 Pt will be able to demonstrate 5/5 MMT with L ankle PF and L knee extension in order to demonstrate functional improvement in LE function for daily mobility and house hold duties.  Baseline: 12/08/2020 12/14 Partially met Partially met  4 Pt will have an at least 18 pt improvement in LEFS measure in order  to demonstrate MCID improvement in daily function.  Baseline: 12/08/2020 ongoing  5 Pt will demonstrate at least a 15 pt improvement in SPADI in order to demonstrate a clinically significant change in shoulder pain and function  01/21/2021  INITIAL    PLAN: PT FREQUENCY: 1-2x/week   PT DURATION: 8 weeks   PLANNED INTERVENTIONS: Therapeutic exercises, Therapeutic activity, Neuro Muscular re-education, Balance training, Gait training, Patient/Family education, Joint mobilization, Stair training, Orthotic/Fit training, Aquatic Therapy, Dry Needling, Electrical stimulation, Spinal mobilization,  Cryotherapy, Moist heat, scar mobilization, Taping, Vasopneumatic device, Traction, Ultrasound, Ionotophoresis 4mg /ml Dexamethasone, and Manual therapy   PLAN FOR NEXT SESSION: aquatics for LE strength, add in L shoulder ROM and cuff strength    Stanton Kidney (Frankie) Dazha Kempa MPT  12/24/20 5:00 PM

## 2020-12-31 ENCOUNTER — Ambulatory Visit (HOSPITAL_BASED_OUTPATIENT_CLINIC_OR_DEPARTMENT_OTHER): Payer: Medicare Other | Admitting: Physical Therapy

## 2020-12-31 ENCOUNTER — Other Ambulatory Visit: Payer: Self-pay

## 2020-12-31 DIAGNOSIS — M25512 Pain in left shoulder: Secondary | ICD-10-CM

## 2020-12-31 DIAGNOSIS — M79672 Pain in left foot: Secondary | ICD-10-CM

## 2020-12-31 DIAGNOSIS — R262 Difficulty in walking, not elsewhere classified: Secondary | ICD-10-CM | POA: Diagnosis not present

## 2020-12-31 DIAGNOSIS — G8929 Other chronic pain: Secondary | ICD-10-CM | POA: Diagnosis not present

## 2020-12-31 DIAGNOSIS — M6281 Muscle weakness (generalized): Secondary | ICD-10-CM | POA: Diagnosis not present

## 2020-12-31 NOTE — Therapy (Signed)
OUTPATIENT PHYSICAL THERAPY RE-EVALUATION NOTE Progress Note Reporting Period 10/13/20 to 12/24/20  See note below for Objective Data and Assessment of Progress/Goals.      Patient Name: Jennifer Mcclain MRN: [id number] DOB:[birth year], 75 y.o., female Today's Date: 12/31/2020  PCP: Lauree Chandler, NP REFERRING PROVIDER: Lauree Chandler, NP   PT End of Session - 12/31/20 1508     Visit Number 11    Number of Visits 27    Date for PT Re-Evaluation 03/16/21    Authorization Type Medicare    PT Start Time 1504    PT Stop Time 5397    PT Time Calculation (min) 41 min    Equipment Utilized During Treatment Other (comment)   Ankle cuff and waist buoys, kick board, noodle/sqoodle, nekdoodle, weights and barbells, water walker   Activity Tolerance Patient tolerated treatment well;Patient limited by pain    Behavior During Therapy WFL for tasks assessed/performed               Past Medical History:  Diagnosis Date   Achilles tendinitis    Per [facility] Patient packet    Atrial fibrillation [facility])    Per records from [facility] in [place], [place]    Baker's cyst of knee, left    with resulting lower extremity edema   Benign cyst of breast, left    Per records from [facility] in [place], [place]. Has yearly U/S in the past    Cerebrovascular disease    Per [facility] New Patient packet    Chronic atrial fibrillation ([place])    Per [place] Patient packet    Chronic atrial fibrillation (HCC)    [name], MD   Decreased hearing, bilateral    referred to audiologist, Per records from [facility] in [place], [place]    Disorder of bone density and structure, unspecified    Per records from [facility] in [place], [place]    Disorder of carotid artery [facility])    [name], MD   Dyslipidemia    [name], MD   Elevated alkaline phosphatase level    Neg ative ANA 08/2013. Per records from [facility] in [place], [place]     H/O calculus of kidney during pregnancy    [name], MD   H/O hysterectomy for benign disease    [name], MD   History of fibula fracture    Right, Per records from [facility] in [place], [place]    History of torn meniscus of right knee    Per records from [facility] in [place], [place]    Humerus fracture    left leg- due to fall   Humerus fracture    Resulting from a fall, Per records from [facility] in [place], [place]    Hypercalcemia    Per records from [facility] in [place], [place]    Hypertension    Per [facility] New Patient packet    Kidney stone on left side    Per records from [facility] in [place], [place]    Lactose intolerance    Per [facility] New Patient packet    Multiple actinic keratoses    [name], MD   Myalgia due to statin    Per records from [facility] in [place], [place]. Due to crestor    Non-toxic multinodular goiter 01/21/2017   [name], MD   Normocytic anemia    [name], MD  Obstructive sleep apnea syndrome    Per records from [facility] in [place], [place]. Patient did not tolerate CPAP machine    Onychomycosis    Per records from [facility] in [place], [place]    Per [facility] New Patient packet    Osteopenia    Per records from [facility] in [place], [place]. Treated in the past with antiresorptive therapy   Renal cyst 10/11/2012   Simple on CT Scan 10/2012. Per records from [facility] in [place], [place]    Sensorineural hearing loss (SNHL), bilateral 01/28/2017   [name] , MD   Umbilical hernia    Per [facility] New Patient packet    Vitamin D deficiency    [name], MD   Past Surgical History:  Procedure Laterality Date   ABDOMINAL HYSTERECTOMY  1989   One ovary, Per [place] New Patient packet    ABLATION  1996   Radical, Per [facility] New Patient packet    BREAST CYST ASPIRATION Bilateral    numerous, over several  years per pt, always benign   COLONOSCOPY  2019   Dr.Melissa Jeneen Rinks, Per [facility] New Patient packet    KNEE ARTHROSCOPY W/ MENISCAL REPAIR  2002   Per [facility] New Patient packet    MOUTH SURGERY     Dental Implant    REPLACEMENT TOTAL KNEE Left 2015   Per [facility] New Patient packet   RIGHT/LEFT HEART CATH AND CORONARY ANGIOGRAPHY N/A 02/07/2018   Procedure: RIGHT/LEFT HEART CATH AND CORONARY ANGIOGRAPHY;  Surgeon: Martinique, Peter M, MD;  Location: [facility];  Service: Cardiovascular;  Laterality: N/A;   TONSILLECTOMY  1951   Per [place] Patient packet    TOOTH EXTRACTION  01/12/2019   TOOTH EXTRACTION  06/05/2019   Preparing for dental implant    Patient Active Problem List   Diagnosis Date Noted   Anticoagulant long-term use 05/04/2019   Chronic combined systolic and diastolic heart failure ([place]) 05/04/2019   Positive 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 09/15/2018   Pure hypercholesterolemia 06/14/2018   Coronary artery disease due to lipid rich plaque 10/93/2355   Systolic dysfunction without heart failure 02/02/2018   Nonischemic cardiomyopathy ([place]) 02/02/2018   Nonrheumatic mitral valve regurgitation 02/02/2018   Nonrheumatic tricuspid valve regurgitation 02/02/2018   Osteopenia 02/22/2017   Bilateral sensorineural hearing loss 01/28/2017   Vitamin D deficiency 01/21/2017   Osteoarthritis of knee 01/21/2017   Non-toxic multinodular goiter 01/21/2017   IFG (impaired fasting glucose) 01/21/2017   Lactose intolerance 01/21/2017   History of hysterectomy for benign disease 01/21/2017   Multiple actinic keratoses 01/21/2017   Normocytic anemia 73/22/0254   Umbilical hernia 27/06/2374   Essential hypertension 11/30/2016   Dyslipidemia 11/30/2016   Chronic atrial fibrillation ([place]) 11/30/2016   Obstructive sleep apnea syndrome 11/30/2016   Renal cyst 10/11/2012    REFERRING DIAG: M25.562 (ICD-10-CM) - Acute pain of left knee M76.62 (ICD-10-CM) - Achilles tendinitis of left lower extremity    THERAPY DIAG:  Pain of left heel  Difficulty walking  Muscle weakness (generalized)  Chronic left shoulder pain  PERTINENT HISTORY: A-fib, CHF (pt report), R fibula fx, L knee TKA, 2000 prox humerus fracture, adhesive capsuilitis  FINDINGS: Surgical changes of total knee arthroplasty without evidence of hardware complication. No knee joint effusion. No lytic or blastic osseous lesion. Soft tissues are unremarkable in appearance.   IMPRESSION: Surgical changes of total knee arthroplasty without evidence of complication.    PRECAUTIONS: N/A  SUBJECTIVE: "Pt reports feeling ok, no changes"      PAIN:  Are you having pain?  VAS scale: 0/10 Pain location: L lateral PAIN TYPE: pulling/sharp  Agg: reaching, horizontal ABD, reaching/lifting  Eases:   OBJECTIVE:   SPADI: Total Disability Score: 21 / 80 = 26.3 % LEFS: 47/80 points (or 58.75%) (5 pt improvement)   UPPER EXTREMITY AROM/PROM:  A/PROM Right 12/31/2020 Left 12/31/2020  Shoulder flexion 149 110  Shoulder extension [facility]  Shoulder abduction 120 120  Shoulder adduction    Shoulder internal rotation [facility]  Shoulder external rotation 55 53  (Blank rows = not tested)  UPPER EXTREMITY MMT:  MMT Right 12/31/2020 Left 12/31/2020 Left   Shoulder flexion 4+/5 4/5   Shoulder IR 4+/5 4/5   Shoulder ER 4+/5 4/5   Abduction 4+/5 4/5   (Blank rows = not tested)  LE AROM/PROM:   A/PROM Right 10/13/2020 Left 10/13/2020  Knee flexion 125 120  Knee extension -5 0  Ankle dorsiflexion 0 0  Ankle plantarflexion WFL WFL   (Blank rows = not tested)   LE MMT:   MMT Right 10/13/2020 Left 10/13/2020 Right 12/24/2020 Left 12/24/2020  Ankle dorsiflexion 5/5 5/5    Ankle plantarflexion 4+/5 4+/5 5-/5 5-/5    SHOULDER SPECIAL TESTS:  Impingement tests: Neer impingement test: negative, Hawkins/Kennedy impingement test: positive , and Painful arc test: positive   SLAP lesions: Biceps load test:  negative  Instability tests: Apprehension test: negative  Rotator cuff assessment: Drop arm test: negative, Empty can test: negative, and Belly press test: negative  Biceps assessment: Speed's test: positive   JOINT MOBILITY TESTING:  WFL of L shoulder  PALPATION:  TTP of L biceps, ant deltoid, pec  POSTURE:  Moderate kyphosis and rounded shoulders     TODAY'S TREATMENT: 12/21 Pt seen for aquatic therapy today.  Treatment took place in water 3.25-4.8 ft in depth at the [facility] pool. Temp of water was 91.  Pt entered/exited the pool via stairs step to pattern independently with bilat rail.   Warm up: forward, backward and side stepping/walking cues for increased step length, increased speed, hand placement to increase resistance.   Stretching: gastroc, hamstrings and adductors 3 x 20-25 sec hold seated   Seated STS from 3rd water step 2x10.  Excellent execution, no LOB Flutter at hip 3 x 20 Cycling 3 x20 revolution  Walking with 1 foam hand buoys submerged for core engagement  Standing  UE using 1 foam hand buoy horizontal add/abd; shoulder flex/ext; add/abd and elbow flex/ext x12 Hand buoy push down for core engagement 2 x 12 cues for isometric hold of gluts and QA Achilles /gastroc stretch Long leg hip flex x12 R/L Long leg hip extension x12 R/L Long leg add/abd x12 Squats x10 pool bottom 3.5 ft.   -cues for tightened core throughout Balance: standing on noodle   Pt requires buoyancy for support and to offload joints with strengthening exercises. Viscosity of the water is needed for resistance of strengthening; water current perturbations provides challenge to standing balance unsupported, requiring increased core activation.      PATIENT EDUCATION:  Education details: anatomy, exercise progression, DOMS expectations, muscle firing, acceptable levels of pain,  envelope of function, HEP, POC   Person educated: Patient Education method: Explanation,  Demonstration, Tactile cues, Verbal cues, and Handouts Education comprehension: verbalized understanding and returned demonstration     HOME EXERCISE PROGRAM: Access Code: [email or web address] URL: [email or web address] Date: 12/16/2020 Prepared by: Daleen Bo  Exercises  Woodpeckers Two Legs - 1 x daily - 7 x weekly - 1 sets - 15 reps - 3 hold Slant Board Gastrocnemius Stretch - 2 x daily - 7 x weekly - 1 sets - 3 reps - 30 hold Standing Quad Stretch with Strap - 2 x daily - 7 x weekly - 1 sets - 3 reps - 30 hold Seated Long Arc Quad with Ankle Weight - 1 x daily - 7 x weekly - 2 sets - 10 reps Heel Toe Raises at Pool Wall - 1 x daily - 7 x weekly - 3 sets - 10 reps Standing Hip Flexion Extension at [employer] - 1 x daily - 7 x weekly - 3 sets - 10 reps Standing Hip Abduction Adduction at [employer] - 1 x daily - 7 x weekly - 3 sets - 10 reps Standing Knee Flexion - 1 x daily - 7 x weekly - 3 sets - 10 reps Doorway Pec Stretch at 60 Degrees Abduction with Arm Straight - 1 x daily - 7 x weekly - 1 sets - 3 reps - 30 hold Bicep Stretch at Table - 1 x daily - 7 x weekly - 1 sets - 3 reps - 30 hold Shoulder External Rotation and Scapular Retraction with Resistance - 1 x daily - 7 x weekly - 2 sets - 10 reps        ASSESSMENT:   CLINICAL IMPRESSION: Pt without complaints of pain.  Left knee "I know it is there".  Pt is directed through tasks focused on LE strengthening and ROM as well as core strength.  Added balance challenge in standing. She has some difficulty executing.  Will advance with next visit.    REHAB POTENTIAL: Good   CLINICAL DECISION MAKING: Stable/uncomplicated   EVALUATION COMPLEXITY: Low     GOALS:     SHORT TERM GOALS:   STG Name Target Date Goal status  1 Pt will become independent with HEP in order to demonstrate synthesis of PT education.    10/27/2020 MET  2 Pt will be able to demonstrate ability to perform 10 HR without pain in order to demonstrate  functional improvement in LE function for gait and house hold duties.   11/10/2020 MET  3 Pt will have an at least 9 pt improvement in LEFS measure in order to demonstrate MCID improvement in daily function.   11/10/2020 Partially met    LONG TERM GOALS:    LTG Name Target Date Goal status  1 Pt  will become independent with final HEP in order to demonstrate synthesis of PT education.   Baseline: 12/08/2020 ongoing  2 Pt will be able to demonstrate/report ability to walk >20 mins without pain in order to demonstrate functional improvement and tolerance to exercise and community mobility.   Baseline: 12/08/2020 12/14 Partially met Ache[place]  3 Pt will be able to demonstrate 5/5 MMT with L ankle PF and L knee extension in order to demonstrate functional improvement in LE function for daily mobility and house hold duties.  Baseline: 12/08/2020 12/14 Partially met Partially met  4 Pt will have an at least 18 pt improvement in LEFS measure in order to demonstrate MCID improvement in daily function.  Baseline: 12/08/2020 ongoing  5 Pt will demonstrate at least a 15 pt improvement in SPADI in order to demonstrate a clinically significant change in shoulder pain and function  01/28/2021  INITIAL    PLAN: PT FREQUENCY: 1-2x/week   PT DURATION: 8 weeks  PLANNED INTERVENTIONS: Therapeutic exercises, Therapeutic activity, Neuro Muscular re-education, Balance training, Gait training, Patient/Family education, Joint mobilization, Stair training, Orthotic/Fit training, Aquatic Therapy, Dry Needling, Electrical stimulation, Spinal mobilization, Cryotherapy, Moist heat, scar mobilization, Taping, Vasopneumatic device, Traction, Ultrasound, Ionotophoresis 4mg /ml Dexamethasone, and Manual therapy   PLAN FOR NEXT SESSION: aquatics: standing balance; shoulder strength and ROM    Stanton Kidney Tharon Aquas) Clarence Dunsmore MPT  12/31/20 5:36 PM

## 2021-01-08 ENCOUNTER — Other Ambulatory Visit: Payer: Self-pay | Admitting: Cardiology

## 2021-01-14 ENCOUNTER — Ambulatory Visit (HOSPITAL_BASED_OUTPATIENT_CLINIC_OR_DEPARTMENT_OTHER): Payer: Medicare Other | Attending: Nurse Practitioner | Admitting: Physical Therapy

## 2021-01-14 ENCOUNTER — Other Ambulatory Visit: Payer: Self-pay

## 2021-01-14 ENCOUNTER — Encounter (HOSPITAL_BASED_OUTPATIENT_CLINIC_OR_DEPARTMENT_OTHER): Payer: Self-pay | Admitting: Physical Therapy

## 2021-01-14 DIAGNOSIS — M25512 Pain in left shoulder: Secondary | ICD-10-CM | POA: Diagnosis not present

## 2021-01-14 DIAGNOSIS — M79672 Pain in left foot: Secondary | ICD-10-CM | POA: Insufficient documentation

## 2021-01-14 DIAGNOSIS — G8929 Other chronic pain: Secondary | ICD-10-CM | POA: Insufficient documentation

## 2021-01-14 DIAGNOSIS — M6281 Muscle weakness (generalized): Secondary | ICD-10-CM | POA: Diagnosis not present

## 2021-01-14 DIAGNOSIS — R262 Difficulty in walking, not elsewhere classified: Secondary | ICD-10-CM | POA: Diagnosis not present

## 2021-01-14 NOTE — Therapy (Signed)
OUTPATIENT PHYSICAL THERAPY RE-EVALUATION NOTE Progress Note Reporting Period 10/13/20 to 12/24/20  See note below for Objective Data and Assessment of Progress/Goals.      Patient Name: Jennifer Mcclain MRN: [id number] DOB:Jul 26, 1945, 76 y.o., female Today's Date: 01/14/2021  PCP: Lauree Chandler, NP REFERRING PROVIDER: Lauree Chandler, NP   PT End of Session - 01/14/21 1410     Visit Number 12    Number of Visits 27    Date for PT Re-Evaluation 03/16/21    Authorization Type Medicare    PT Start Time 7672    PT Stop Time 0947    PT Time Calculation (min) 44 min    Equipment Utilized During Treatment Other (comment)   Ankle cuff and waist buoys, kick board, noodle/sqoodle, nekdoodle, weights and barbells, water walker   Activity Tolerance Patient tolerated treatment well;Patient limited by pain    Behavior During Therapy WFL for tasks assessed/performed               Past Medical History:  Diagnosis Date   Achilles tendinitis    Per [facility] Patient packet    Atrial fibrillation [facility])    Per records from [facility] in [place], [place]    Baker's cyst of knee, left    with resulting lower extremity edema   Benign cyst of breast, left    Per records from [facility] in [place], [place]. Has yearly U/S in the past    Cerebrovascular disease    Per [facility] New Patient packet    Chronic atrial fibrillation ([place])    Per [place] Patient packet    Chronic atrial fibrillation (HCC)    [name], MD   Decreased hearing, bilateral    referred to audiologist, Per records from [facility] in [place], [place]    Disorder of bone density and structure, unspecified    Per records from [facility] in [place], [place]    Disorder of carotid artery [facility])    [name], MD   Dyslipidemia    [name], MD   Elevated alkaline phosphatase level    Neg ative ANA 08/2013. Per records from [facility] in [place], [place]     H/O calculus of kidney during pregnancy    [name], MD   H/O hysterectomy for benign disease    [name], MD   History of fibula fracture    Right, Per records from [facility] in [place], [place]    History of torn meniscus of right knee    Per records from [facility] in [place], [place]    Humerus fracture    left leg- due to fall   Humerus fracture    Resulting from a fall, Per records from [facility] in [place], [place]    Hypercalcemia    Per records from [facility] in [place], [place]    Hypertension    Per [facility] New Patient packet    Kidney stone on left side    Per records from [facility] in [place], [place]    Lactose intolerance    Per [facility] New Patient packet    Multiple actinic keratoses    [name], MD   Myalgia due to statin    Per records from [facility] in [place], [place]. Due to crestor    Non-toxic multinodular goiter 01/21/2017   [name], MD   Normocytic anemia    [name], MD  Obstructive sleep apnea syndrome    Per records from [facility] in [place], [place]. Patient did not tolerate CPAP machine    Onychomycosis    Per records from [facility] in [place], [place]    Per [facility] New Patient packet    Osteopenia    Per records from [facility] in [place], [place]. Treated in the past with antiresorptive therapy   Renal cyst 10/11/2012   Simple on CT Scan 10/2012. Per records from [facility] in [place], [place]    Sensorineural hearing loss (SNHL), bilateral 01/28/2017   [name] , MD   Umbilical hernia    Per [facility] New Patient packet    Vitamin D deficiency    [name], MD   Past Surgical History:  Procedure Laterality Date   ABDOMINAL HYSTERECTOMY  1989   One ovary, Per [place] New Patient packet    ABLATION  1996   Radical, Per [facility] New Patient packet    BREAST CYST ASPIRATION Bilateral    numerous, over several  years per pt, always benign   COLONOSCOPY  2019   Dr.Melissa Jeneen Rinks, Per [facility] New Patient packet    KNEE ARTHROSCOPY W/ MENISCAL REPAIR  2002   Per [facility] New Patient packet    MOUTH SURGERY     Dental Implant    REPLACEMENT TOTAL KNEE Left 2015   Per [facility] New Patient packet   RIGHT/LEFT HEART CATH AND CORONARY ANGIOGRAPHY N/A 02/07/2018   Procedure: RIGHT/LEFT HEART CATH AND CORONARY ANGIOGRAPHY;  Surgeon: Martinique, Peter M, MD;  Location: [facility];  Service: Cardiovascular;  Laterality: N/A;   TONSILLECTOMY  1951   Per [place] Patient packet    TOOTH EXTRACTION  01/12/2019   TOOTH EXTRACTION  06/05/2019   Preparing for dental implant    Patient Active Problem List   Diagnosis Date Noted   Anticoagulant long-term use 05/04/2019   Chronic combined systolic and diastolic heart failure ([place]) 05/04/2019   Positive 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 09/15/2018   Pure hypercholesterolemia 06/14/2018   Coronary artery disease due to lipid rich plaque 10/93/2355   Systolic dysfunction without heart failure 02/02/2018   Nonischemic cardiomyopathy ([place]) 02/02/2018   Nonrheumatic mitral valve regurgitation 02/02/2018   Nonrheumatic tricuspid valve regurgitation 02/02/2018   Osteopenia 02/22/2017   Bilateral sensorineural hearing loss 01/28/2017   Vitamin D deficiency 01/21/2017   Osteoarthritis of knee 01/21/2017   Non-toxic multinodular goiter 01/21/2017   IFG (impaired fasting glucose) 01/21/2017   Lactose intolerance 01/21/2017   History of hysterectomy for benign disease 01/21/2017   Multiple actinic keratoses 01/21/2017   Normocytic anemia 73/22/0254   Umbilical hernia 27/06/2374   Essential hypertension 11/30/2016   Dyslipidemia 11/30/2016   Chronic atrial fibrillation ([place]) 11/30/2016   Obstructive sleep apnea syndrome 11/30/2016   Renal cyst 10/11/2012    REFERRING DIAG: M25.562 (ICD-10-CM) - Acute pain of left knee M76.62 (ICD-10-CM) - Achilles tendinitis of left lower extremity    THERAPY DIAG:  Pain of left heel  Difficulty walking  Muscle weakness (generalized)  Chronic left shoulder pain  PERTINENT HISTORY: A-fib, CHF (pt report), R fibula fx, L knee TKA, 2000 prox humerus fracture, adhesive capsuilitis  FINDINGS: Surgical changes of total knee arthroplasty without evidence of hardware complication. No knee joint effusion. No lytic or blastic osseous lesion. Soft tissues are unremarkable in appearance.   IMPRESSION: Surgical changes of total knee arthroplasty without evidence of complication.    PRECAUTIONS: N/A  SUBJECTIVE: Pt reports being more limited by her cardiac dysfunction with ability to tolerate amb, not knee or ankle      PAIN:  Are you having pain? no VAS scale: 0/10 Pain location: L lateral PAIN TYPE: pulling/sharp  Agg: reaching, horizontal ABD, reaching/lifting  Eases:   OBJECTIVE:   SPADI: Total Disability Score: 21 / 80 = 26.3 % LEFS: 47/80 points (or 58.75%) (5 pt improvement)   UPPER EXTREMITY AROM/PROM:  A/PROM Right 01/14/2021 Left 01/14/2021  Shoulder flexion 149 110  Shoulder extension [facility]  Shoulder abduction 120 120  Shoulder adduction    Shoulder internal rotation [facility]  Shoulder external rotation 55 53  (Blank rows = not tested)  UPPER EXTREMITY MMT:  MMT Right 01/14/2021 Left 01/14/2021 Left   Shoulder flexion 4+/5 4/5   Shoulder IR 4+/5 4/5   Shoulder ER 4+/5 4/5   Abduction 4+/5 4/5   (Blank rows = not tested)  LE AROM/PROM:   A/PROM Right 10/13/2020 Left 10/13/2020  Knee flexion 125 120  Knee extension -5 0  Ankle dorsiflexion 0 0  Ankle plantarflexion WFL WFL   (Blank rows = not tested)   LE MMT:   MMT Right 10/13/2020 Left 10/13/2020 Right 12/24/2020 Left 12/24/2020  Ankle dorsiflexion 5/5 5/5    Ankle plantarflexion 4+/5 4+/5 5-/5 5-/5    SHOULDER SPECIAL TESTS:  Impingement tests: Neer impingement test: negative, Hawkins/Kennedy impingement test: positive , and Painful  arc test: positive   SLAP lesions: Biceps load test: negative  Instability tests: Apprehension test: negative  Rotator cuff assessment: Drop arm test: negative, Empty can test: negative, and Belly press test: negative  Biceps assessment: Speed's test: positive   JOINT MOBILITY TESTING:  WFL of L shoulder  PALPATION:  TTP of L biceps, ant deltoid, pec  POSTURE:  Moderate kyphosis and rounded shoulders     TODAY'S TREATMENT: 12/21 Pt seen for aquatic therapy today.  Treatment took place in water 3.25-4.8 ft in depth at the [employer] pool. Temp of water was 91.  Pt entered/exited the pool via stairs step to pattern independently with bilat rail.   Warm up: forward, backward and side stepping/walking cues for increased step length, increased speed, hand placement to increase resistance. Added hand buoys x 6 widths submerged for added core engagement.   Stretching: gastroc, hamstrings and adductors 3 x 20-25 sec hold seated  Standing gastroc stretch on bottom water step 3 x 20 sec  Seated STS from 3rd water step x10. Then x10 from 2nd water step. Excellent execution, no LOB Flutter at hip 3 x 30 Add/abd 3x20 Cycling 3 x20 revolution  Pt requires buoyancy for support and to offload joints with strengthening exercises. Viscosity of the water is needed for resistance of strengthening; water current perturbations provides challenge to standing balance unsupported, requiring increased core activation.      PATIENT EDUCATION:  Education details: anatomy, exercise progression, DOMS expectations, muscle firing, acceptable levels of pain,  envelope of function, HEP, POC    Person educated: Patient Education method: Explanation, Demonstration, Tactile cues, Verbal cues, and Handouts Education comprehension: verbalized understanding and returned demonstration     HOME EXERCISE PROGRAM: Access Code: [email or web address] URL: [email or web address] Date: 12/16/2020 Prepared  by: Daleen Bo  Exercises Woodpeckers Two Legs - 1 x daily - 7 x weekly - 1 sets - 15 reps - 3 hold Slant Board Gastrocnemius Stretch - 2 x daily - 7 x weekly - 1 sets - 3 reps - 30 hold Standing  Sports administrator with Strap - 2 x daily - 7 x weekly - 1 sets - 3 reps - 30 hold Seated Long Arc Quad with Ankle Weight - 1 x daily - 7 x weekly - 2 sets - 10 reps Heel Toe Raises at [place] - 1 x daily - 7 x weekly - 3 sets - 10 reps Standing Hip Flexion Extension at [employer] - 1 x daily - 7 x weekly - 3 sets - 10 reps Standing Hip Abduction Adduction at [employer] - 1 x daily - 7 x weekly - 3 sets - 10 reps Standing Knee Flexion - 1 x daily - 7 x weekly - 3 sets - 10 reps Doorway Pec Stretch at 60 Degrees Abduction with Arm Straight - 1 x daily - 7 x weekly - 1 sets - 3 reps - 30 hold Bicep Stretch at Table - 1 x daily - 7 x weekly - 1 sets - 3 reps - 30 hold Shoulder External Rotation and Scapular Retraction with Resistance - 1 x daily - 7 x weekly - 2 sets - 10 reps        ASSESSMENT:   CLINICAL IMPRESSION: Pt reports little to no ankle or knee (left) pain. Some minor discomfort in left upper shoulder.  Pt has met most goals. Needs to complete surveys and be tested. She has been coming to exercise here at [facility] 2x weekly routinely. Discussed DC with her and she is in agreement.  Will see pt on land next visit with plans to dc.   Focus today is on proximal strengthening in seated position. She demonstrates good execution of all exercises as well as STS at 1 step level up increasing resistance from gravity. Will finalize and ensure indep with HEP next visit and DC.     REHAB POTENTIAL: Good   CLINICAL DECISION MAKING: Stable/uncomplicated   EVALUATION COMPLEXITY: Low     GOALS:     SHORT TERM GOALS:   STG Name Target Date Goal status  1 Pt will become independent with HEP in order to demonstrate synthesis of PT education.    10/27/2020 MET  2 Pt will be able to demonstrate ability  to perform 10 HR without pain in order to demonstrate functional improvement in LE function for gait and house hold duties.   11/10/2020 MET  3 Pt will have an at least 9 pt improvement in LEFS measure in order to demonstrate MCID improvement in daily function.   11/10/2020 Partially met    LONG TERM GOALS:    LTG Name Target Date Goal status  1 Pt  will become independent with final HEP in order to demonstrate synthesis of PT education.   Baseline: 12/08/2020 ongoing  2 Pt will be able to demonstrate/report ability to walk >20 mins without pain in order to demonstrate functional improvement and tolerance to exercise and community mobility.   Baseline: 12/08/2020 12/14 Partially met [place]  3 Pt will be able to demonstrate 5/5 MMT with L ankle PF and L knee extension in order to demonstrate functional improvement in LE function for daily mobility and house hold duties.  Baseline: 12/08/2020 12/14 Partially met Partially met  4 Pt will have an at least 18 pt improvement in LEFS measure in order to demonstrate MCID improvement in daily function.  Baseline: 12/08/2020 ongoing  5 Pt will demonstrate at least a 15 pt improvement in SPADI in order to demonstrate a clinically significant change in shoulder pain and function  02/11/2021  INITIAL    PLAN: PT FREQUENCY: 1-2x/week   PT DURATION: 8 weeks   PLANNED INTERVENTIONS: Therapeutic exercises, Therapeutic activity, Neuro Muscular re-education, Balance training, Gait training, Patient/Family education, Joint mobilization, Stair training, Orthotic/Fit training, Aquatic Therapy, Dry Needling, Electrical stimulation, Spinal mobilization, Cryotherapy, Moist heat, scar mobilization, Taping, Vasopneumatic device, Traction, Ultrasound, Ionotophoresis 35m/ml Dexamethasone, and Manual therapy   PLAN FOR NEXT SESSION: aquatics: standing balance; shoulder strength and ROM    Mary (Frankie) Ziemba MPT  01/14/21 7:05 PM

## 2021-01-22 ENCOUNTER — Other Ambulatory Visit: Payer: Self-pay

## 2021-01-22 ENCOUNTER — Ambulatory Visit (HOSPITAL_BASED_OUTPATIENT_CLINIC_OR_DEPARTMENT_OTHER): Payer: Medicare Other | Admitting: Physical Therapy

## 2021-01-22 ENCOUNTER — Encounter (HOSPITAL_BASED_OUTPATIENT_CLINIC_OR_DEPARTMENT_OTHER): Payer: Self-pay | Admitting: Physical Therapy

## 2021-01-22 DIAGNOSIS — R262 Difficulty in walking, not elsewhere classified: Secondary | ICD-10-CM | POA: Diagnosis not present

## 2021-01-22 DIAGNOSIS — G8929 Other chronic pain: Secondary | ICD-10-CM | POA: Diagnosis not present

## 2021-01-22 DIAGNOSIS — M79672 Pain in left foot: Secondary | ICD-10-CM | POA: Diagnosis not present

## 2021-01-22 DIAGNOSIS — M6281 Muscle weakness (generalized): Secondary | ICD-10-CM | POA: Diagnosis not present

## 2021-01-22 DIAGNOSIS — M25512 Pain in left shoulder: Secondary | ICD-10-CM | POA: Diagnosis not present

## 2021-01-22 NOTE — Therapy (Signed)
OUTPATIENT PHYSICAL THERAPY RE-EVALUATION NOTE Progress Note Reporting Period 10/13/20 to 12/24/20  See note below for Objective Data and Assessment of Progress/Goals.      Patient Name: Jennifer Mcclain MRN: [id number] DOB:07/16/1945, 76 y.o., female Today's Date: 01/22/2021  PCP: [name], NP REFERRING PROVIDER: [name], NP   PT End of Session - 01/22/21 1355     Visit Number 13    Number of Visits 27    Date for PT Re-Evaluation 03/16/21    Authorization Type Medicare    PT Start Time 1400    PT Stop Time 1450    PT Time Calculation (min) 50 min    Equipment Utilized During Treatment Other (comment)   Ankle cuff and waist buoys, kick board, noodle/sqoodle, nekdoodle, weights and barbells, water walker   Activity Tolerance Patient tolerated treatment well;Patient limited by pain    Behavior During Therapy WFL for tasks assessed/performed               Past Medical History:  Diagnosis Date   Achilles tendinitis    Per [facility] New Patient packet    Atrial fibrillation [facility])    Per records from [facility] in [place], [place]    Baker's cyst of knee, left    with resulting lower extremity edema   Benign cyst of breast, left    Per records from [facility] in [place], [place]. Has yearly U/S in the past    Cerebrovascular disease    Per [facility] New Patient packet    Chronic atrial fibrillation (HCC)    Per [facility] New Patient packet    Chronic atrial fibrillation (HCC)    [name], MD   Decreased hearing, bilateral    referred to audiologist, Per records from [facility] in [place], [place]    Disorder of bone density and structure, unspecified    Per records from [facility] in [place], [place]    Disorder of carotid artery [facility])    Lynden Ang, MD   Dyslipidemia    [name], MD   Elevated alkaline phosphatase level    Neg ative ANA 08/2013. Per records from [facility] in [place], [place]     H/O calculus of kidney during pregnancy    Lynden Ang, MD   H/O hysterectomy for benign disease    Lynden Ang, MD   History of fibula fracture    Right, Per records from [facility] in [place], [place]    History of torn meniscus of right knee    Per records from [facility] in [place], [place]    Humerus fracture    left leg- due to fall   Humerus fracture    Resulting from a fall, Per records from [facility] in [place], [place]    Hypercalcemia    Per records from [facility] in [place], [place]    Hypertension    Per [facility] New Patient packet    Kidney stone on left side    Per records from [facility] in [place], [place]    Lactose intolerance    Per [facility] New Patient packet    Multiple actinic keratoses    Lynden Ang, MD   Myalgia due to statin    Per records from [facility] in [place], [place]. Due to crestor    Non-toxic multinodular goiter 01/21/2017   Lynden Ang, MD   Normocytic anemia    Lynden Ang, MD  Obstructive sleep apnea syndrome    Per records from [facility] in [place], [place]. Patient did not tolerate CPAP machine    Onychomycosis    Per records from [facility] in [place], [place]    Per [facility] New Patient packet    Osteopenia    Per records from [facility] in [place], [place]. Treated in the past with antiresorptive therapy   Renal cyst 10/11/2012   Simple on CT Scan 10/2012. Per records from [facility] in [place], [place]    Sensorineural hearing loss (SNHL), bilateral 01/28/2017   [name] , MD   Umbilical hernia    Per [facility] New Patient packet    Vitamin D deficiency    [name], MD   Past Surgical History:  Procedure Laterality Date   ABDOMINAL HYSTERECTOMY  1989   One ovary, Per [facility] New Patient packet    ABLATION  1996   Radical, Per [facility] New Patient packet    BREAST CYST ASPIRATION Bilateral    numerous, over several  years per pt, always benign   COLONOSCOPY  2019   Dr.Melissa Jeneen Rinks, Per [facility] New Patient packet    KNEE ARTHROSCOPY W/ MENISCAL REPAIR  2002   Per [facility] New Patient packet    MOUTH SURGERY     Dental Implant    REPLACEMENT TOTAL KNEE Left 2015   Per [facility] New Patient packet   RIGHT/LEFT HEART CATH AND CORONARY ANGIOGRAPHY N/A 02/07/2018   Procedure: RIGHT/LEFT HEART CATH AND CORONARY ANGIOGRAPHY;  Surgeon: Martinique, Peter M, MD;  Location: [facility];  Service: Cardiovascular;  Laterality: N/A;   TONSILLECTOMY  1951   Per [facility] Patient packet    TOOTH EXTRACTION  01/12/2019   TOOTH EXTRACTION  06/05/2019   Preparing for dental implant    Patient Active Problem List   Diagnosis Date Noted   Anticoagulant long-term use 05/04/2019   Chronic combined systolic and diastolic heart failure ([place]) 05/04/2019   Positive 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 09/15/2018   Pure hypercholesterolemia 06/14/2018   Coronary artery disease due to lipid rich plaque 10/93/2355   Systolic dysfunction without heart failure 02/02/2018   Nonischemic cardiomyopathy ([place]) 02/02/2018   Nonrheumatic mitral valve regurgitation 02/02/2018   Nonrheumatic tricuspid valve regurgitation 02/02/2018   Osteopenia 02/22/2017   Bilateral sensorineural hearing loss 01/28/2017   Vitamin D deficiency 01/21/2017   Osteoarthritis of knee 01/21/2017   Non-toxic multinodular goiter 01/21/2017   IFG (impaired fasting glucose) 01/21/2017   Lactose intolerance 01/21/2017   History of hysterectomy for benign disease 01/21/2017   Multiple actinic keratoses 01/21/2017   Normocytic anemia 73/22/0254   Umbilical hernia 27/06/2374   Essential hypertension 11/30/2016   Dyslipidemia 11/30/2016   Chronic atrial fibrillation ([place]) 11/30/2016   Obstructive sleep apnea syndrome 11/30/2016   Renal cyst 10/11/2012    REFERRING DIAG: M25.562 (ICD-10-CM) - Acute pain of left knee M76.62 (ICD-10-CM) - Achilles tendinitis of left lower extremity    THERAPY DIAG:  Pain of left heel  Difficulty walking  Muscle weakness (generalized)  Chronic left shoulder pain  PERTINENT HISTORY: A-fib, CHF (pt report), R fibula fx, L knee TKA, 2000 prox humerus fracture, adhesive capsuilitis  FINDINGS: Surgical changes of total knee arthroplasty without evidence of hardware complication. No knee joint effusion. No lytic or blastic osseous lesion. Soft tissues are unremarkable in appearance.   IMPRESSION: Surgical changes of total knee arthroplasty without evidence of complication.    PRECAUTIONS: N/A  SUBJECTIVE: Pt completes LEFS and SPADI and reports she feels she is mostly limited due to her cardiac fxn not strength, balance and pain      PAIN:  Are you having pain? no VAS scale: 0/10 Pain location: L lateral PAIN TYPE: pulling/sharp  Agg: reaching, horizontal ABD, reaching/lifting  Eases:   OBJECTIVE:   SPADI: Total Disability Score: 21 / 80 = 26.3 % LEFS: 47/80 points (or 58.75%) (5 pt improvement)   UPPER EXTREMITY AROM/PROM:  A/PROM Right 01/22/2021 Left 01/22/2021  Shoulder flexion 149 110  Shoulder extension [facility]  Shoulder abduction 120 120  Shoulder adduction    Shoulder internal rotation [facility]  Shoulder external rotation 55 53  (Blank rows = not tested)  UPPER EXTREMITY MMT:  MMT Right 01/22/2021 Left 01/22/2021 Left   Shoulder flexion 4+/5 4/5   Shoulder IR 4+/5 4/5   Shoulder ER 4+/5 4/5   Abduction 4+/5 4/5   (Blank rows = not tested)  LE AROM/PROM:   A/PROM Right 10/13/2020 Left 10/13/2020  Knee flexion 125 120  Knee extension -5 0  Ankle dorsiflexion 0 0  Ankle plantarflexion WFL WFL   (Blank rows = not tested)   LE MMT:   MMT Right 10/13/2020 Left 10/13/2020 Right 12/24/2020 Left 12/24/2020  Ankle dorsiflexion 5/5 5/5    Ankle plantarflexion 4+/5 4+/5 5-/5 5-/5    SHOULDER SPECIAL TESTS:  Impingement tests: Neer impingement test: negative, Hawkins/Kennedy impingement  test: positive , and Painful arc test: positive   SLAP lesions: Biceps load test: negative  Instability tests: Apprehension test: negative  Rotator cuff assessment: Drop arm test: negative, Empty can test: negative, and Belly press test: negative  Biceps assessment: Speed's test: positive   JOINT MOBILITY TESTING:  WFL of L shoulder  PALPATION:  TTP of L biceps, ant deltoid, pec  POSTURE:  Moderate kyphosis and rounded shoulders     TODAY'S TREATMENT: 12/21 Pt seen for aquatic therapy today.  Treatment took place in water 3.25-4.8 ft in depth at the [facility] pool. Temp of water was 91.  Pt entered/exited the pool via stairs step to pattern independently with bilat rail.   Warm up: forward, backward and side stepping/walking cues for increased step length, increased speed, hand placement to increase resistance.  SPADI (21)  and LEFS (39) completed   Standing gastroc stretch on bottom water step 3 x 20 sec  Seated STS from 3rd water step x10. Then x10 from 2nd water step. Excellent execution, no LOB Flutter at hip 3 x 30 Add/abd 3x20 Standing UE supported by hand buoys:hip flex, add/abd and extension 2x10 Buoyancy resisted kick board with cues for tightened core 3x10-15 rep  Pt requires buoyancy for support and to offload joints with strengthening exercises. Viscosity of the water is needed for resistance of strengthening; water current perturbations provides challenge to standing balance unsupported, requiring increased core activation.      PATIENT EDUCATION:  Education details: anatomy, exercise progression, DOMS expectations, muscle firing, acceptable levels of pain,  envelope of function, HEP, POC    Person educated: Patient Education method: Explanation, Demonstration, Tactile cues, Verbal cues, and Handouts Education comprehension: verbalized understanding and returned demonstration     HOME EXERCISE PROGRAM: Access Code: [email or web address] URL:  [email or web address] Date: 12/16/2020 Prepared by: Daleen Bo  Exercises Woodpeckers Two Legs - 1 x daily - 7 x weekly - 1 sets - 15 reps - 3 hold Slant Board Gastrocnemius Stretch - 2 x daily - 7 x weekly - 1  sets - 3 reps - 30 hold Standing Quad Stretch with Strap - 2 x daily - 7 x weekly - 1 sets - 3 reps - 30 hold Seated Long Arc Quad with Ankle Weight - 1 x daily - 7 x weekly - 2 sets - 10 reps Heel Toe Raises at [place] - 1 x daily - 7 x weekly - 3 sets - 10 reps Standing Hip Flexion Extension at [employer] - 1 x daily - 7 x weekly - 3 sets - 10 reps Standing Hip Abduction Adduction at Pool Wall - 1 x daily - 7 x weekly - 3 sets - 10 reps Standing Knee Flexion - 1 x daily - 7 x weekly - 3 sets - 10 reps Doorway Pec Stretch at 60 Degrees Abduction with Arm Straight - 1 x daily - 7 x weekly - 1 sets - 3 reps - 30 hold Bicep Stretch at Table - 1 x daily - 7 x weekly - 1 sets - 3 reps - 30 hold Shoulder External Rotation and Scapular Retraction with Resistance - 1 x daily - 7 x weekly - 2 sets - 10 reps        ASSESSMENT:   CLINICAL IMPRESSION:  Pt given another copy of laminated HEP today with with focus of treatment ensuring understanding an indep.  She is tested for DC. SPADI and LEFS goals not met due to pt limitations she is having concerning her heart function (A-fib).  She has demonstrated good functional gains with therapy meeting her other goals. ROM and strength of shoulder and LE improved.  She is now attending water aerobic classes 2x weekly.  She has reached her maximal potential and is ready for dc.     REHAB POTENTIAL: Good   CLINICAL DECISION MAKING: Stable/uncomplicated   EVALUATION COMPLEXITY: Low     GOALS:     SHORT TERM GOALS:   STG Name Target Date Goal status  1 Pt will become independent with HEP in order to demonstrate synthesis of PT education.    10/27/2020 MET  2 Pt will be able to demonstrate ability to perform 10 HR without  pain in order to demonstrate functional improvement in LE function for gait and house hold duties.   11/10/2020 MET  3 Pt will have an at least 9 pt improvement in LEFS measure in order to demonstrate MCID improvement in daily function.   11/10/2020 01/22/21 Partially met Not met had 5 pt gain    LONG TERM GOALS:    LTG Name Target Date Goal status  1 Pt  will become independent with final HEP in order to demonstrate synthesis of PT education.   Baseline: 12/08/2020  01/22/21 Ongoing   Achieved  2 Pt will be able to demonstrate/report ability to walk >20 mins without pain in order to demonstrate functional improvement and tolerance to exercise and community mobility.   Baseline: 12/08/2020 12/14 Partially met Acheived  3 Pt will be able to demonstrate 5/5 MMT with L ankle PF and L knee extension in order to demonstrate functional improvement in LE function for daily mobility and house hold duties.  Baseline: 12/08/2020 12/14  01/22/21 Partially met Partially met Achieved  4 Pt will have an at least 18 pt improvement in LEFS measure in order to demonstrate MCID improvement in daily function.  Baseline: 12/08/2020  01/22/21 Ongoing  Not Met  5 Pt will demonstrate at least a 15 pt improvement in SPADI in order to demonstrate a clinically  significant change in shoulder pain and function  02/19/2021 01/22/21 INITIAL  Not Met    PLAN: PT FREQUENCY: 1-2x/week   PT DURATION: 8 weeks   PLANNED INTERVENTIONS: Therapeutic exercises, Therapeutic activity, Neuro Muscular re-education, Balance training, Gait training, Patient/Family education, Joint mobilization, Stair training, Orthotic/Fit training, Aquatic Therapy, Dry Needling, Electrical stimulation, Spinal mobilization, Cryotherapy, Moist heat, scar mobilization, Taping, Vasopneumatic device, Traction, Ultrasound, Ionotophoresis 4mg /ml Dexamethasone, and Manual therapy   PLAN FOR NEXT SESSION:dc'ed  PHYSICAL THERAPY DISCHARGE  SUMMARY  Visits from Start of Care: 10/13/20  Current functional level related to goals / functional outcomes: Pt indep with all functional mobility, amb without AD, drives, shops   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 HEP  Patient agrees to discharge. Patient goals were partially met. Patient is being discharged due to maximized rehab potential.      Stanton Kidney Tharon Aquas) Zamya Culhane MPT  01/22/21 6:46 PM

## 2021-01-28 ENCOUNTER — Ambulatory Visit (HOSPITAL_BASED_OUTPATIENT_CLINIC_OR_DEPARTMENT_OTHER): Payer: Medicare Other | Admitting: Physical Therapy

## 2021-02-04 ENCOUNTER — Ambulatory Visit (HOSPITAL_BASED_OUTPATIENT_CLINIC_OR_DEPARTMENT_OTHER): Payer: Medicare Other | Admitting: Physical Therapy

## 2021-02-23 ENCOUNTER — Encounter (HOSPITAL_BASED_OUTPATIENT_CLINIC_OR_DEPARTMENT_OTHER): Payer: Self-pay | Admitting: Nurse Practitioner

## 2021-02-23 ENCOUNTER — Other Ambulatory Visit: Payer: Self-pay

## 2021-02-23 ENCOUNTER — Ambulatory Visit (INDEPENDENT_AMBULATORY_CARE_PROVIDER_SITE_OTHER): Payer: Medicare Other | Admitting: Nurse Practitioner

## 2021-02-23 VITALS — BP 117/72 | HR 96 | Ht 62.0 in | Wt 216.2 lb

## 2021-02-23 DIAGNOSIS — I2583 Coronary atherosclerosis due to lipid rich plaque: Secondary | ICD-10-CM | POA: Diagnosis not present

## 2021-02-23 DIAGNOSIS — I5042 Chronic combined systolic (congestive) and diastolic (congestive) heart failure: Secondary | ICD-10-CM | POA: Diagnosis not present

## 2021-02-23 DIAGNOSIS — L089 Local infection of the skin and subcutaneous tissue, unspecified: Secondary | ICD-10-CM | POA: Insufficient documentation

## 2021-02-23 DIAGNOSIS — Z0000 Encounter for general adult medical examination without abnormal findings: Secondary | ICD-10-CM | POA: Insufficient documentation

## 2021-02-23 DIAGNOSIS — I428 Other cardiomyopathies: Secondary | ICD-10-CM | POA: Diagnosis not present

## 2021-02-23 DIAGNOSIS — Z6839 Body mass index (BMI) 39.0-39.9, adult: Secondary | ICD-10-CM | POA: Diagnosis not present

## 2021-02-23 DIAGNOSIS — E7800 Pure hypercholesterolemia, unspecified: Secondary | ICD-10-CM

## 2021-02-23 DIAGNOSIS — I2510 Atherosclerotic heart disease of native coronary artery without angina pectoris: Secondary | ICD-10-CM | POA: Diagnosis not present

## 2021-02-23 DIAGNOSIS — Z7901 Long term (current) use of anticoagulants: Secondary | ICD-10-CM | POA: Diagnosis not present

## 2021-02-23 DIAGNOSIS — I482 Chronic atrial fibrillation, unspecified: Secondary | ICD-10-CM | POA: Diagnosis not present

## 2021-02-23 MED ORDER — MUPIROCIN 2 % EX OINT
TOPICAL_OINTMENT | CUTANEOUS | 3 refills | Status: DC
  Filled 2021-05-25: qty 30, 7d supply, fill #0

## 2021-02-23 NOTE — Patient Instructions (Signed)
Thank you for choosing [place] at [facility] for your Primary Care needs. I am excited for the opportunity to partner with you to meet your health care goals. It was a pleasure meeting you today!  Recommendations from today's visit: We will plan to see you in a month or so and get labs.  It was a pleasure to meet you today.   Information on diet, exercise, and health maintenance recommendations are listed below. This is information to help you be sure you are on track for optimal health and monitoring.   Please look over this and let us know if you have any questions or if you have completed any of the health maintenance outside of [place] so that we can be sure your records are up to date.  ___________________________________________________________ About Me: I am an Adult-Geriatric Nurse Practitioner with a background in caring for patients for more than 20 years with a strong intensive care background. I provide primary care and sports medicine services to patients age 69 and older within this office. My education had a strong focus on caring for the older adult population, which I am passionate about. I am also the director of the APP Fellowship with [facility].   My desire is to provide you with the best service through preventive medicine and supportive care. I consider you a part of the medical team and value your input. I work diligently to ensure that you are heard and your needs are met in a safe and effective manner. I want you to feel comfortable with me as your provider and want you to know that your health concerns are important to me.  For your information, our office hours are: Monday, Tuesday, and Thursday 8:00 AM - 5:00 PM Wednesday and Friday 8:00 AM - 12:00 PM.   In my time away from the office I am teaching new APP's within the system and am unavailable, but my partner, Dr. Burnard Bunting is in the office for emergent needs.   If you have questions or  concerns, please call our office at [phone] or send [place] a MyChart message and we will respond as quickly as possible.  ____________________________________________________________ MyChart:  For all urgent or time sensitive needs we ask that you please call the office to avoid delays. Our number is [phone]. MyChart is not constantly monitored and due to the large volume of messages a day, replies may take up to 72 business hours.  MyChart Policy: MyChart allows for you to see your visit notes, after visit summary, provider recommendations, lab and tests results, make an appointment, request refills, and contact your provider or the office for non-urgent questions or concerns. Providers are seeing patients during normal business hours and do not have built in time to review MyChart messages.  We ask that you allow a minimum of 3 business days for responses to [employer]. For this reason, please do not send urgent requests through [place]. Please call the office at [phone]. New and ongoing conditions may require a visit. We have virtual and in person visit available for your convenience.  Complex MyChart concerns may require a visit. Your provider may request you schedule a virtual or in person visit to ensure we are providing the best care possible. MyChart messages sent after 11:00 AM on Friday will not be received by the provider until Monday morning.    Lab and Test Results: You will receive your lab and test results on MyChart as soon as  they are completed and results have been sent by the lab or testing facility. Due to this service, you will receive your results BEFORE your provider.  I review lab and tests results each morning prior to seeing patients. Some results require collaboration with other providers to ensure you are receiving the most appropriate care. For this reason, we ask that you please allow a minimum of 3-5 business days from the time the ALL results have  been received for your provider to receive and review lab and test results and contact you about these.  Most lab and test result comments from the provider will be sent through [place]. Your provider may recommend changes to the plan of care, follow-up visits, repeat testing, ask questions, or request an office visit to discuss these results. You may reply directly to this message or call the office at [phone] to provide information for the provider or set up an appointment. In some instances, you will be called with test results and recommendations. Please let us know if this is preferred and we will make note of this in your chart to provide this for you.    If you have not heard a response to your lab or test results in 5 business days from all results returning to [place], please call the office to let us know. We ask that you please avoid calling prior to this time unless there is an emergent concern. Due to high call volumes, this can delay the resulting process.  After Hours: For all non-emergency after hours needs, please call the office at [phone] and select the option to reach the on-call provider service. On-call services are shared between multiple [place] offices and therefore it will not be possible to speak directly with your provider. On-call providers may provide medical advice and recommendations, but are unable to provide refills for maintenance medications.  For all emergency or urgent medical needs after normal business hours, we recommend that you seek care at the closest Urgent Care or Emergency Department to ensure appropriate treatment in a timely manner.  [facility] [place] has a 24 hour emergency room located on the ground floor for your convenience.   Urgent Concerns During the Business Day Providers are seeing patients from 8AM to [place] with a busy schedule and are most often not able to respond to non-urgent calls until the end of the day or the  next business day. If you should have URGENT concerns during the day, please call and speak to the nurse or schedule a same day appointment so that we can address your concern without delay.   Thank you, again, for choosing me as your health care partner. I appreciate your trust and look forward to learning more about you.   Worthy Keeler, DNP, AGNP-c ___________________________________________________________  Health Maintenance Recommendations Screening Testing Mammogram Every 1 -2 years based on history and risk factors Starting at age 23 Pap Smear Ages 21-39 every 3 years Ages 76-65 every 5 years with HPV testing More frequent testing may be required based on results and history Colon Cancer Screening Every 1-10 years based on test performed, risk factors, and history Starting at age 63 Bone Density Screening Every 2-10 years based on history Starting at age 41 for women Recommendations for men differ based on medication usage, history, and risk factors AAA Screening One time ultrasound Men 27-54 years old who have every smoked Lung Cancer Screening Low Dose Lung CT every 12 months Age 94-80 years with a  30 pack-year smoking history who still smoke or who have quit within the last 15 years  Screening Labs Routine  Labs: Complete Blood Count (CBC), Complete Metabolic Panel (CMP), Cholesterol (Lipid Panel) Every 6-12 months based on history and medications May be recommended more frequently based on current conditions or previous results Hemoglobin A1c Lab Every 3-12 months based on history and previous results Starting at age 23 or earlier with diagnosis of diabetes, high cholesterol, BMI >26, and/or risk factors Frequent monitoring for patients with diabetes to ensure blood sugar control Thyroid Panel (TSH w/ T3 & T4) Every 6 months based on history, symptoms, and risk factors May be repeated more often if on medication HIV One time testing for all patients 61 and  older May be repeated more frequently for patients with increased risk factors or exposure Hepatitis C One time testing for all patients 76 and older May be repeated more frequently for patients with increased risk factors or exposure Gonorrhea, Chlamydia Every 12 months for all sexually active persons 13-24 years Additional monitoring may be recommended for those who are considered high risk or who have symptoms PSA Men 77-45 years old with risk factors Additional screening may be recommended from age 31-69 based on risk factors, symptoms, and history  Vaccine Recommendations Tetanus Booster All adults every 10 years Flu Vaccine All patients 6 months and older every year COVID Vaccine All patients 12 years and older Initial dosing with booster May recommend additional booster based on age and health history HPV Vaccine 2 doses all patients age 53-26 Dosing may be considered for patients over 26 Shingles Vaccine (Shingrix) 2 doses all adults 51 years and older Pneumonia (Pneumovax 23) All adults 56 years and older May recommend earlier dosing based on health history Pneumonia (Prevnar 36) All adults 28 years and older Dosed 1 year after Pneumovax 23  Additional Screening, Testing, and Vaccinations may be recommended on an individualized basis based on family history, health history, risk factors, and/or exposure.  __________________________________________________________  Diet Recommendations for All Patients  I recommend that all patients maintain a diet low in saturated fats, carbohydrates, and cholesterol. While this can be challenging at first, it is not impossible and small changes can make big differences.  Things to try: Decreasing the amount of soda, sweet tea, and/or juice to one or less per day and replace with water While water is always the first choice, if you do not like water you may consider adding a water additive without sugar to improve the taste other  sugar free drinks Replace potatoes with a brightly colored vegetable at dinner Use healthy oils, such as canola oil or olive oil, instead of butter or hard margarine Limit your bread intake to two pieces or less a day Replace regular pasta with low carb pasta options Bake, broil, or grill foods instead of frying Monitor portion sizes  Eat smaller, more frequent meals throughout the day instead of large meals  An important thing to remember is, if you love foods that are not great for your health, you don't have to give them up completely. Instead, allow these foods to be a reward when you have done well. Allowing yourself to still have special treats every once in a while is a nice way to tell yourself thank you for working hard to keep yourself healthy.   Also remember that every day is a new day. If you have a bad day and "fall off the wagon", you can still climb right back  up and keep moving along on your journey!  We have resources available to help you!  Some websites that may be helpful include: www.http://carter.biz/  Www.[employer]Well[employer].com _____________________________________________________________  Activity Recommendations for All Patients  I recommend that all adults get at least 20 minutes of moderate physical activity that elevates your heart rate at least 5 days out of the week.  Some examples include: Walking or jogging at a pace that allows you to carry on a conversation Cycling (stationary bike or outdoors) Water aerobics Yoga Weight lifting Dancing If physical limitations prevent you from putting stress on your joints, exercise in a pool or seated in a chair are excellent options.  Do determine your MAXIMUM heart rate for activity: YOUR AGE - 220 = MAX HeartRate   Remember! Do not push yourself too hard.  Start slowly and build up your pace, speed, weight, time in exercise, etc.  Allow your body to rest between exercise and get good sleep. You will need more water than  normal when you are exerting yourself. Do not wait until you are thirsty to drink. Drink with a purpose of getting in at least 8, 8 ounce glasses of water a day plus more depending on how much you exercise and sweat.    If you begin to develop dizziness, chest pain, abdominal pain, jaw pain, shortness of breath, headache, vision changes, lightheadedness, or other concerning symptoms, stop the activity and allow your body to rest. If your symptoms are severe, seek emergency evaluation immediately. If your symptoms are concerning, but not severe, please let us know so that we can recommend further evaluation.

## 2021-02-23 NOTE — Progress Notes (Signed)
Orma Render, DNP, AGNP-c Primary Care & Sports Medicine [address]   [place], [place] [address] [phone]  New patient visit   Patient: Jennifer Mcclain   DOB: 11/15/1945   76 y.o. Female  MRN: [id number] Visit Date: 02/23/2021  Patient Care Team: Rein Popov, Coralee Pesa, NP as PCP - General (Nurse Practitioner) Buford Dresser, MD as PCP - Cardiology (Cardiology) Daleen Bo, PT as Physical Therapist (Physical Therapy) Jennifer Noon, [place] as Referring Physician (Optometry)  Today's healthcare provider: Orma Render, NP   Chief Complaint  Patient presents with   New Patient (Initial Visit)    Patient presents today to establish care. She would like to discuss a good exercise plan with you for CHF anf AFIB, she just completed water aerobics at [facility]. She would for her right nostril to be looked at,she stated she had a sore.    Subjective    Jennifer Mcclain is a 76 y.o. female who presents today as a new patient to establish care.    Patient endorses the following concerns presently: Sore in right nare Patient endorses an ongoing irritation/sore in the right nares that sometimes splits.  She denies tenderness to the area but reports that this has been present for approximately 3 months.  She does not have any known mechanism of injury that started.  The area seems to be healing very slow.  No drainage, erythema, edema present.  Exercise plan Patient tells me that she has a significant history of CHF and A-fib and is currently followed with cardiology by Dr. Harrell Mcclain.  She is on an exercise plan and has been doing water aerobics with [facility] [employer].  She has been doing these approximately 2 days a week but would like to be engaged in this more often as she does feel it is helpful for her cardiovascular health.  She was told by the fitness instructor that she should not be doing more than twice a week in the water.  She has also been doing chair  yoga which is somewhat helpful.  She would like to be in the water more often and would like my opinion on if this is safe for her to do.  History reviewed and reveals the following: Past Medical History:  Diagnosis Date   Achilles tendinitis    Per [place] Patient packet    Atrial fibrillation [facility])    Per records from [facility] in [place], [place]    Baker's cyst of knee, left    with resulting lower extremity edema   Benign cyst of breast, left    Per records from [facility] in [place], [place]. Has yearly U/S in the past    Cerebrovascular disease    Per [facility] New Patient packet    Chronic atrial fibrillation ([place])    Per [place] Patient packet    Chronic atrial fibrillation (HCC)    Jennifer Boroughs, MD   Decreased hearing, bilateral    referred to audiologist, Per records from [facility] in [place], [place]    Disorder of bone density and structure, unspecified    Per records from [facility] in 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 Per records from [facility] in [place], [place]    H/O calculus of  kidney during pregnancy    Jennifer Cloud, MD   H/O hysterectomy for benign disease    Jennifer Cloud, MD   History of fibula fracture    Right, Per records from [facility] in [place], [place]    History of torn meniscus of right knee    Per records from [facility] in [place], [place]    Humerus fracture    left leg- due to fall   Humerus fracture    Resulting from a fall, Per records from [facility] in [place], [place]    Hypercalcemia    Per records from [facility] in [place], [place]    Hypertension    Per [facility] New Patient packet    Kidney stone on left side    Per records from [facility] in [place], [place]    Lactose intolerance    Per [facility] New  Patient packet    Multiple actinic keratoses    Jennifer Cloud, MD   Myalgia due to statin    Per records from [facility] in [place], [place]. Due to crestor    Non-toxic multinodular goiter 01/21/2017   Jennifer Cloud, MD   Normocytic anemia    Jennifer Cloud, MD   Obstructive sleep apnea syndrome    Per records from [facility] in [place], [place]. Patient did not tolerate CPAP machine    Onychomycosis    Per records from [facility] in [place], [place]    Per [facility] New Patient packet    Osteopenia    Per records from [facility] in [place], [place]. Treated in the past with antiresorptive therapy   Renal cyst 10/11/2012   Simple on CT Scan 10/2012. Per records from [facility] in [place], [place]    Sensorineural hearing loss (SNHL), bilateral 01/28/2017   Jennifer Mcclain , MD   Umbilical hernia    Per [facility] New Patient packet    Vitamin D deficiency    Jennifer Cloud, MD   Past Surgical History:  Procedure Laterality Date   ABDOMINAL HYSTERECTOMY  1989   One ovary, Per [place] New Patient packet    ABLATION  1996   Radical, Per [facility] New Patient packet    BREAST CYST ASPIRATION Bilateral    numerous, over several years per pt, always benign   COLONOSCOPY  2019   Dr.Melissa Jeneen Mcclain, Per [facility] New Patient packet    KNEE ARTHROSCOPY W/ MENISCAL REPAIR  2002   Per [facility] New Patient packet    MOUTH SURGERY     Dental Implant    REPLACEMENT TOTAL KNEE Left 2015   Per [facility] New Patient packet   RIGHT/LEFT HEART CATH AND CORONARY ANGIOGRAPHY N/A 02/07/2018   Procedure: RIGHT/LEFT HEART CATH AND CORONARY ANGIOGRAPHY;  Surgeon: Martinique, Peter M, MD;  Location: [facility];  Service: Cardiovascular;  Laterality: N/A;   TONSILLECTOMY  1951   Per [place] New Patient packet    TOOTH EXTRACTION  01/12/2019   TOOTH EXTRACTION  06/05/2019   Preparing for dental implant    Family Status  Relation Name Status   Mother Lelan Pons Deceased at age 74        CHF, COPD   Father Herbie [place] Deceased at age 2       CVA/dementia   Sister Jeralene Huff   MGF  Deceased   Sister Altha Harm Alive   Daughter Bryson Ha Deceased   Mat Aunt  (Not Specified)   MGM  Deceased   Pat Aunt  Deceased   Family History  Problem Relation Age of Onset   Congestive Heart Failure Mother    Diabetes Mother 16   Arthritis Mother 66   Stroke Father    Dementia Father    Diabetes Father    Hyperlipidemia Father    Alzheimer's disease Father 61   Diabetes Sister    Cancer Maternal Grandfather    Diabetes Maternal Grandfather 42   CVA Maternal Grandfather    AAA (abdominal aortic aneurysm) Sister    Congenital heart disease Daughter    Heart attack Maternal Aunt    Lung cancer Maternal Grandmother 48   Breast cancer Paternal Aunt    Social History   Socioeconomic History   Marital status: Married    Spouse name: Not on file   Number of children: Not on file   Years of education: Not on file   Highest education level: Not on file  Occupational History   Not on file  Tobacco Use   Smoking status: Never   Smokeless tobacco: Never  Vaping Use   Vaping Use: Never used  Substance and Sexual Activity   Alcohol use: Yes    Comment: 2-3 a month - social   Drug use: Never   Sexual activity: Not on file  Other Topics Concern   Not on file  Social History Narrative   Per [facility] New Patient Packet 12/06/17:      Diet: Low fat, dairy free      Caffeine: Yes      Married, if yes what year: Married, 1971      Do you live in a house, apartment, assisted living, condo, trailer, ect: Condo, 2 stories, 2 persons       Pets: No      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 No   Do you have difficulty preparing food  or eating? No   Do you have difficulty managing your medications? No   Do you have difficulty managing your finances? No   Do you have difficulty affording your medications? No   Social Determinants of Radio broadcast assistant Strain: Not on file  Food Insecurity: Not on file  Transportation Needs: Not on file  Physical Activity: Not on file  Stress: Not on file  Social Connections: Not on file   Outpatient Medications Prior to Visit  Medication Sig   acetaminophen (TYLENOL) 650 MG CR tablet Take 650 mg by mouth in the morning, at noon, and at bedtime.   Calcium Carb-Cholecalciferol (CALCIUM 600/VITAMIN D3 PO) Take 1 tablet by mouth 2 (two) times daily.   Cholecalciferol (VITAMIN D3) 25 MCG (1000 UT) CAPS Take 1,000 Units by mouth daily.    ENTRESTO 49-51 MG TAKE 1 TABLET BY MOUTH 2 TIMES DAILY   Loratadine (CLARITIN PO) Take by mouth daily.   metoprolol succinate (TOPROL-XL) 50 MG 24 hr tablet Take 2 tablets (100 mg total) by mouth in the morning AND 1 tablet (50 mg total) every evening.   Multiple Vitamins-Minerals (OCUVITE PO) Take 1 tablet by mouth daily.    polycarbophil (FIBERCON) 625 MG tablet Take 625 mg by mouth daily.  Probiotic Product (PHILLIPS COLON HEALTH PO) Take 1 capsule by mouth daily.    rosuvastatin (CRESTOR) 5 MG tablet TAKE 1 TABLET BY MOUTH 4 TIMES A WEEK   XARELTO 20 MG TABS tablet TAKE 1 TABLET EVERY DAY WITH SUPPER   Zinc 50 MG TABS Take 1 tablet by mouth daily.   No facility-administered medications prior to visit.   Allergies  Allergen Reactions   Latex Rash and Other (See Comments)    welps   Adhesive [Tape]     Skin irritation    Grass Extracts [Gramineae Pollens]      Per records from from [facility]   Lactose Intolerance (Gi) Other (See Comments)    Unknown   Naprosyn [Naproxen] Hives   Coq10 [Coenzyme Q10] Other (See Comments)    Cramps    Zocor [Simvastatin] Other (See Comments)    cramps   Immunization History   Administered Date(s) Administered   Fluad Quad(high Dose 65+) 09/14/2018, 09/24/2019, 09/26/2020   Influenza, High Dose Seasonal PF 10/31/2017   Influenza, Quadrivalent, Recombinant, Inj, Pf 10/11/2016   Influenza,inj,quad, With Preservative 10/11/2016   PFIZER(Purple Top)SARS-COV-2 Vaccination 01/30/2019, 02/19/2019, 11/06/2019   Pneumococcal Conjugate-13 11/20/2013   Pneumococcal Polysaccharide-23 11/13/2012   Tdap 11/27/2007, 07/05/2018   Zoster Recombinat (Shingrix) 05/08/2016, 08/14/2016    Health Maintenance Due: Health Maintenance  Topic Date Due   COVID-19 Vaccine (4 - Booster for Pfizer series) 01/01/2020   MAMMOGRAM  09/18/2022   Fecal DNA (Cologuard)  04/02/2023   TETANUS/TDAP  07/04/2028   Pneumonia Vaccine 45+ Years old  Completed   INFLUENZA VACCINE  Completed   DEXA SCAN  Completed   Hepatitis C Screening  Completed   Zoster Vaccines- Shingrix  Completed   HPV VACCINES  Aged Out   COLONOSCOPY (Pts 45-33yrs Insurance coverage will need to be confirmed)  Discontinued    Review of Systems All review of systems negative except what is listed in the HPI   Objective    BP 117/72    Pulse 96    Ht 5\' 2"  (1.575 m)    Wt 216 lb 3.2 oz (98.1 kg)    SpO2 96%    BMI 39.54 kg/m  Physical Exam Vitals and nursing note reviewed.  Constitutional:      General: She is not in acute distress.    Appearance: Normal appearance.  Eyes:     Extraocular Movements: Extraocular movements intact.     Conjunctiva/sclera: Conjunctivae normal.     Pupils: Pupils are equal, round, and reactive to light.  Neck:     Vascular: No carotid bruit.  Cardiovascular:     Rate and Rhythm: Normal rate and regular rhythm.     Pulses: Normal pulses.     Heart sounds: Normal heart sounds. No murmur heard. Pulmonary:     Effort: Pulmonary effort is normal.     Breath sounds: Normal breath sounds. No wheezing.  Abdominal:     General: Bowel sounds are normal.     Palpations: Abdomen is soft.   Musculoskeletal:        General: Normal range of motion.     Cervical back: Normal range of motion.     Right lower leg: Edema present.     Left lower leg: Edema present.     Comments: Mild nonpitting bilateral lower extremity edema.  Skin:    General: Skin is warm and dry.     Capillary Refill: Capillary refill takes less than 2 seconds.  Neurological:  General: No focal deficit present.     Mental Status: She is alert and oriented to person, place, and time.  Psychiatric:        Mood and Affect: Mood normal.        Behavior: Behavior normal.        Thought Content: Thought content normal.        Judgment: Judgment normal.    No results found for any visits on 02/23/21.  Assessment & Plan      Problem List Items Addressed This Visit     Encounter for medical examination to establish care - Primary   Skin infection   Relevant Medications   mupirocin ointment (BACTROBAN) 2 %   Morbid obesity (HCC)   Anticoagulant long-term use   Chronic combined systolic and diastolic heart failure (HCC)   Nonischemic cardiomyopathy ([place])   Pure hypercholesterolemia   Coronary artery disease due to lipid rich plaque   Chronic atrial fibrillation (HCC)  Small fissure noted in the right nare on the most anterior aspect with no signs of edema, erythema, drainage present.  Given the length of time symptoms have been present recommend utilization of mupirocin ointment to see if this will aid in faster healing.  Patient is agreeable.  We will send medication to pharmacy.  Chronic cardiac conditions appear to be well managed at this time with current medication regimen.  She is followed by cardiology with Dr. Harrell Mcclain.  She has had recent labs with the cardiology office which we will review today.  No changes in medication or plan of care at this time.  Recommend continuing with diet and exercise plan and routine follow-up with cardiology as directed.  Discussed exercise options with patient  today and recommend continued water aerobics.  Discussed with patient that is perfectly acceptable for her to continue with this form of therapy for more than 2 days a week if she would like.  What aerobics can help improve her cardiovascular health and be easier on the joints therefore this is encouraged.  Also encouraged her to continue with chair yoga. Recommend monitoring for any new or worsening symptoms and to report this immediately to primary care or cardiology.     Return in about 4 weeks (around 03/23/2021) for F/U labs.     Hinata Diener, Coralee Pesa, NP, DNP, AGNP-C Primary Care & Sports Medicine at [place]

## 2021-02-27 DIAGNOSIS — H25813 Combined forms of age-related cataract, bilateral: Secondary | ICD-10-CM | POA: Diagnosis not present

## 2021-02-27 DIAGNOSIS — H18593 Other hereditary corneal dystrophies, bilateral: Secondary | ICD-10-CM | POA: Diagnosis not present

## 2021-02-27 DIAGNOSIS — H353131 Nonexudative age-related macular degeneration, bilateral, early dry stage: Secondary | ICD-10-CM | POA: Diagnosis not present

## 2021-02-27 DIAGNOSIS — H5213 Myopia, bilateral: Secondary | ICD-10-CM | POA: Diagnosis not present

## 2021-03-13 ENCOUNTER — Other Ambulatory Visit: Payer: Self-pay | Admitting: Cardiology

## 2021-03-13 NOTE — Telephone Encounter (Signed)
Rx(s) sent to pharmacy electronically.  

## 2021-03-23 ENCOUNTER — Telehealth (HOSPITAL_BASED_OUTPATIENT_CLINIC_OR_DEPARTMENT_OTHER): Payer: Self-pay

## 2021-03-23 ENCOUNTER — Other Ambulatory Visit: Payer: Self-pay

## 2021-03-23 ENCOUNTER — Ambulatory Visit (HOSPITAL_BASED_OUTPATIENT_CLINIC_OR_DEPARTMENT_OTHER): Payer: Medicare Other

## 2021-03-23 DIAGNOSIS — E559 Vitamin D deficiency, unspecified: Secondary | ICD-10-CM

## 2021-03-23 DIAGNOSIS — Z0000 Encounter for general adult medical examination without abnormal findings: Secondary | ICD-10-CM

## 2021-03-23 DIAGNOSIS — E0844 Diabetes mellitus due to underlying condition with diabetic amyotrophy: Secondary | ICD-10-CM | POA: Diagnosis not present

## 2021-03-24 DIAGNOSIS — H04123 Dry eye syndrome of bilateral lacrimal glands: Secondary | ICD-10-CM | POA: Diagnosis not present

## 2021-03-24 DIAGNOSIS — H25813 Combined forms of age-related cataract, bilateral: Secondary | ICD-10-CM | POA: Diagnosis not present

## 2021-03-27 ENCOUNTER — Ambulatory Visit: Payer: Medicare Other | Admitting: Nurse Practitioner

## 2021-04-01 ENCOUNTER — Encounter (HOSPITAL_BASED_OUTPATIENT_CLINIC_OR_DEPARTMENT_OTHER): Payer: Self-pay | Admitting: Nurse Practitioner

## 2021-04-01 LAB — CBC WITH DIFFERENTIAL/PLATELET
Basophils Absolute: 0.1 10*3/uL (ref 0.0–0.2)
Basos: 1 %
EOS (ABSOLUTE): 0.1 10*3/uL (ref 0.0–0.4)
Eos: 1 %
Hematocrit: 42.2 % (ref 34.0–46.6)
Hemoglobin: 14 g/dL (ref 11.1–15.9)
Immature Grans (Abs): 0 10*3/uL (ref 0.0–0.1)
Immature Granulocytes: 0 %
Lymphocytes Absolute: 1.9 10*3/uL (ref 0.7–3.1)
Lymphs: 40 %
MCH: 32 pg (ref 26.6–33.0)
MCHC: 33.2 g/dL (ref 31.5–35.7)
MCV: 97 fL (ref 79–97)
Monocytes Absolute: 0.4 10*3/uL (ref 0.1–0.9)
Monocytes: 9 %
Neutrophils Absolute: 2.2 10*3/uL (ref 1.4–7.0)
Neutrophils: 49 %
Platelets: 188 10*3/uL (ref 150–450)
RBC: 4.37 x10E6/uL (ref 3.77–5.28)
RDW: 13 % (ref 11.7–15.4)
WBC: 4.6 10*3/uL (ref 3.4–10.8)

## 2021-04-01 LAB — LIPID PANEL
Chol/HDL Ratio: 2.1 ratio (ref 0.0–4.4)
Cholesterol, Total: 217 mg/dL — ABNORMAL HIGH (ref 100–199)
HDL: 104 mg/dL (ref >39)
LDL Chol Calc (NIH): 99 mg/dL (ref 0–99)
Triglycerides: 84 mg/dL (ref 0–149)
VLDL Cholesterol Cal: 14 mg/dL (ref 5–40)

## 2021-04-01 LAB — HEMOGLOBIN A1C
Est. average glucose Bld gHb Est-mCnc: 114 mg/dL
Hgb A1c MFr Bld: 5.6 % (ref 4.8–5.6)

## 2021-04-01 LAB — COMPREHENSIVE METABOLIC PANEL
ALT: 9 IU/L (ref 0–32)
AST: 17 IU/L (ref 0–40)
Albumin/Globulin Ratio: 1.9 (ref 1.2–2.2)
Albumin: 4.6 g/dL (ref 3.7–4.7)
Alkaline Phosphatase: 92 IU/L (ref 44–121)
BUN/Creatinine Ratio: 19 (ref 12–28)
BUN: 22 mg/dL (ref 8–27)
Bilirubin Total: 0.5 mg/dL (ref 0.0–1.2)
CO2: 22 mmol/L (ref 20–29)
Calcium: 10.4 mg/dL — ABNORMAL HIGH (ref 8.7–10.3)
Chloride: 101 mmol/L (ref 96–106)
Creatinine, Ser: 1.16 mg/dL — ABNORMAL HIGH (ref 0.57–1.00)
Globulin, Total: 2.4 g/dL (ref 1.5–4.5)
Glucose: 99 mg/dL (ref 70–99)
Potassium: 4.9 mmol/L (ref 3.5–5.2)
Sodium: 141 mmol/L (ref 134–144)
Total Protein: 7 g/dL (ref 6.0–8.5)
eGFR: 50 mL/min/{1.73_m2} — ABNORMAL LOW (ref >59)

## 2021-04-01 LAB — B12 AND FOLATE PANEL
Folate: 16.1 ng/mL (ref >3.0)
Vitamin B-12: 356 pg/mL (ref 232–1245)

## 2021-04-01 LAB — VITAMIN D 1,25 DIHYDROXY
Vitamin D 1, 25 (OH)2 Total: 39 pg/mL
Vitamin D2 1, 25 (OH)2: <10 pg/mL
Vitamin D3 1, 25 (OH)2: 37 pg/mL

## 2021-04-01 LAB — TSH: TSH: 2.73 u[IU]/mL (ref 0.450–4.500)

## 2021-04-02 ENCOUNTER — Encounter (HOSPITAL_BASED_OUTPATIENT_CLINIC_OR_DEPARTMENT_OTHER): Payer: Self-pay | Admitting: Nurse Practitioner

## 2021-04-07 ENCOUNTER — Other Ambulatory Visit (HOSPITAL_BASED_OUTPATIENT_CLINIC_OR_DEPARTMENT_OTHER): Payer: Self-pay | Admitting: Nurse Practitioner

## 2021-04-07 DIAGNOSIS — Z961 Presence of intraocular lens: Secondary | ICD-10-CM | POA: Diagnosis not present

## 2021-04-07 DIAGNOSIS — N1831 Chronic kidney disease, stage 3a: Secondary | ICD-10-CM

## 2021-04-15 DIAGNOSIS — H21561 Pupillary abnormality, right eye: Secondary | ICD-10-CM | POA: Diagnosis not present

## 2021-04-15 DIAGNOSIS — H2511 Age-related nuclear cataract, right eye: Secondary | ICD-10-CM | POA: Diagnosis not present

## 2021-04-15 DIAGNOSIS — H25811 Combined forms of age-related cataract, right eye: Secondary | ICD-10-CM | POA: Diagnosis not present

## 2021-04-29 DIAGNOSIS — H25812 Combined forms of age-related cataract, left eye: Secondary | ICD-10-CM | POA: Diagnosis not present

## 2021-04-29 DIAGNOSIS — H2512 Age-related nuclear cataract, left eye: Secondary | ICD-10-CM | POA: Diagnosis not present

## 2021-05-07 ENCOUNTER — Encounter (HOSPITAL_BASED_OUTPATIENT_CLINIC_OR_DEPARTMENT_OTHER): Payer: Self-pay | Admitting: Nurse Practitioner

## 2021-05-25 ENCOUNTER — Encounter (HOSPITAL_BASED_OUTPATIENT_CLINIC_OR_DEPARTMENT_OTHER): Payer: Self-pay | Admitting: Cardiology

## 2021-05-25 ENCOUNTER — Ambulatory Visit (INDEPENDENT_AMBULATORY_CARE_PROVIDER_SITE_OTHER): Payer: Medicare Other | Admitting: Cardiology

## 2021-05-25 ENCOUNTER — Other Ambulatory Visit (HOSPITAL_BASED_OUTPATIENT_CLINIC_OR_DEPARTMENT_OTHER): Payer: Self-pay

## 2021-05-25 VITALS — BP 124/82 | HR 84 | Ht 62.0 in | Wt 212.0 lb

## 2021-05-25 DIAGNOSIS — I428 Other cardiomyopathies: Secondary | ICD-10-CM | POA: Diagnosis not present

## 2021-05-25 DIAGNOSIS — Z7901 Long term (current) use of anticoagulants: Secondary | ICD-10-CM | POA: Diagnosis not present

## 2021-05-25 DIAGNOSIS — I482 Chronic atrial fibrillation, unspecified: Secondary | ICD-10-CM

## 2021-05-25 DIAGNOSIS — I10 Essential (primary) hypertension: Secondary | ICD-10-CM

## 2021-05-25 DIAGNOSIS — I2583 Coronary atherosclerosis due to lipid rich plaque: Secondary | ICD-10-CM

## 2021-05-25 DIAGNOSIS — I2510 Atherosclerotic heart disease of native coronary artery without angina pectoris: Secondary | ICD-10-CM | POA: Diagnosis not present

## 2021-05-25 DIAGNOSIS — Z7189 Other specified counseling: Secondary | ICD-10-CM

## 2021-05-25 MED ORDER — ENTRESTO 49-51 MG PO TABS
1.0000 | ORAL_TABLET | Freq: Two times a day (BID) | ORAL | 3 refills | Status: DC
  Filled 2021-05-25 (×2): qty 180, 90d supply, fill #0
  Filled 2021-07-14: qty 60, 30d supply, fill #0
  Filled 2021-08-10: qty 60, 30d supply, fill #1
  Filled 2021-09-09: qty 60, 30d supply, fill #2
  Filled 2021-10-11: qty 60, 30d supply, fill #3
  Filled 2021-11-09: qty 60, 30d supply, fill #4

## 2021-05-25 MED ORDER — SACUBITRIL-VALSARTAN 49-51 MG PO TABS
ORAL_TABLET | ORAL | 3 refills | Status: DC
  Filled 2021-05-25: qty 180, 90d supply, fill #0
  Filled 2021-06-18: qty 60, 30d supply, fill #0

## 2021-05-25 MED FILL — Rosuvastatin Calcium Tab 5 MG: ORAL | 90 days supply | Qty: 48 | Fill #0 | Status: CN

## 2021-05-25 MED FILL — Metoprolol Succinate Tab ER 24HR 50 MG (Tartrate Equiv): ORAL | 90 days supply | Qty: 270 | Fill #0 | Status: CN

## 2021-05-25 MED FILL — Rivaroxaban Tab 20 MG: ORAL | 90 days supply | Qty: 90 | Fill #0 | Status: CN

## 2021-05-25 NOTE — Progress Notes (Signed)
Cardiology Office Note:    Date:  05/25/2021   ID:  Jennifer Mcclain, DOB 1945-10-20, MRN [id number]  PCP:  Orma Render, NP  Cardiologist:  Buford Dresser, MD PhD  Referring MD: Orma Render, NP   CC: follow up  History of Present Illness:    Jennifer Mcclain is a 76 y.o. female with a hx of permanent atrial fibrillation on DOAC, nonischemic cardiomyopathy, hyperlipidemia, hypertension who is seen for follow up. Initial consult with me was on 01/17/18 for atrial fibrillation.  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 Had an ablation done, but was told part of it was too close to the His bundle and so the EP cardiologist ablated all around it but not in the bundle. Moved to [place] about 10 years ago, was told there was no evidence of WPW. Daughter passed away from heart disease; had a kink in one of her arteries, died at age 13. Since coming to [facility], had echo with abnormal EF, moderate MR, moderate-severe TR. Cath did not show significant CAD. Monitor x2 showed 100% afib, now permanent based on patient preference for rate control only.  Today: Had cataract surgery about a month ago, now cleared to get back in the pool, started back about a week ago.   Hasn't noticed any shortness of breath. Mild ankle edema, wears compression stockings.   Referred to [facility] for GFR 50. Believes remotely she was told she may have a kidney cyst. No known proteinuria.  Home BP was 120/70.   Has lost about 8 lbs since October 2022.   Denies chest pain, change in with normal exertion. No PND, orthopnea, or unexpected weight gain. No syncope or palpitations.   Past Medical History:  Diagnosis Date   Achilles tendinitis    Per [facility] New Patient packet    Atrial fibrillation [facility])    Per records from [facility] in [place], [place]    Baker's cyst of knee, left    with resulting lower extremity edema   Benign cyst of breast, left    Per records from  [facility] in [place], [place]. 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 Per records from [facility] in [place], [place]    H/O calculus of kidney during pregnancy    [name], MD   H/O hysterectomy for benign disease    [name], MD   History of fibula fracture    Right, Per records from [facility] in [place], [place]    History of torn meniscus of right knee    Per records from [facility] in [place], [place]    Humerus fracture    left leg- due to fall   Humerus fracture    Resulting from a fall, Per records from [facility] in [place], [place]    Hypercalcemia    Per records from [facility] in [place], [place]    Hypertension    Per [facility] New Patient packet    Kidney stone on left side    Per records from [facility] in [place], [place]    Lactose intolerance    Per [facility] New Patient packet    Multiple actinic keratoses    [name], MD   Myalgia due to statin    Per records from [facility] in [place], [place]. Due to crestor    Non-toxic multinodular goiter 01/21/2017   [name], MD   Normocytic anemia    [name], MD   Obstructive sleep apnea syndrome    Per records from [facility] in [place], [place]. Patient did not tolerate CPAP machine    Onychomycosis    Per records from [facility] in [place], [place]    Per [facility] New Patient packet    Osteopenia    Per records from [facility] in [place], [place]. Treated in the past with antiresorptive therapy   Renal cyst 10/11/2012   Simple on CT Scan 10/2012. Per records from [facility] in [place], [place]    Sensorineural hearing loss (SNHL), bilateral 01/28/2017   [name] , MD   Umbilical hernia    Per [facility] New Patient packet    Vitamin D deficiency    [name], MD    Past Surgical History:  Procedure Laterality Date   ABDOMINAL HYSTERECTOMY  1989   One ovary, Per [facility] New Patient packet    ABLATION  1996   Radical, Per [facility] New Patient packet    BREAST CYST ASPIRATION Bilateral    numerous, over several years per pt, always benign   COLONOSCOPY  2019   Dr.Melissa Jeneen Rinks, Per [facility] New Patient packet    KNEE ARTHROSCOPY W/ MENISCAL REPAIR  2002   Per [facility] New Patient packet    MOUTH SURGERY     Dental Implant    REPLACEMENT TOTAL KNEE Left 2015   Per [facility] New Patient packet   RIGHT/LEFT HEART CATH AND CORONARY ANGIOGRAPHY N/A 02/07/2018   Procedure: RIGHT/LEFT HEART CATH AND CORONARY ANGIOGRAPHY;  Surgeon: Martinique, Peter M, MD;  Location: [place] [facility];  Service: Cardiovascular;  Laterality: N/A;   TONSILLECTOMY  1951   Per [facility] Patient packet    TOOTH EXTRACTION  01/12/2019   TOOTH EXTRACTION  06/05/2019   Preparing for dental implant     Current Medications: Current Outpatient Medications on File Prior to Visit  Medication Sig   acetaminophen (TYLENOL) 650 MG CR tablet Take 650 mg by mouth in the morning, at noon, and at bedtime.   Calcium Carb-Cholecalciferol (CALCIUM 600/VITAMIN D3 PO) Take 1 tablet by mouth 2 (two) times daily.   Cholecalciferol (VITAMIN D3) 25 MCG (1000 UT) CAPS Take 1,000 Units by mouth daily.    ENTRESTO 49-51 MG TAKE 1 TABLET BY MOUTH 2 TIMES DAILY   Loratadine (CLARITIN PO) Take by mouth daily.   metoprolol succinate (TOPROL-XL) 50 MG 24 hr tablet Take 2 tablets  (100 mg total) by mouth in the morning AND 1 tablet (50 mg total) every evening.   Multiple Vitamins-Minerals (OCUVITE PO) Take 1 tablet by mouth daily.    polycarbophil (FIBERCON) 625 MG tablet Take 625 mg by mouth daily.   Probiotic Product (PHILLIPS COLON HEALTH PO) Take 1 capsule by  mouth daily.    rosuvastatin (CRESTOR) 5 MG tablet TAKE 1 TABLET BY MOUTH 4 TIMES A WEEK   XARELTO 20 MG TABS tablet TAKE 1 TABLET BY MOUTH EVERY DAY WITH SUPPER   Zinc 50 MG TABS Take 1 tablet by mouth daily.   mupirocin ointment (BACTROBAN) 2 % Apply to affected area TID for 7 days. (Patient not taking: Reported on 05/25/2021)   No current facility-administered medications on file prior to visit.     Allergies:   Latex, Adhesive [tape], Grass extracts [gramineae pollens], Lactose intolerance (gi), Naprosyn [naproxen], Coq10 [coenzyme q10], and Zocor [simvastatin]   Social History   Tobacco Use   Smoking status: Never   Smokeless tobacco: Never  Vaping Use   Vaping Use: Never used  Substance Use Topics   Alcohol use: Yes    Comment: 2-3 a month - social   Drug use: Never    Family History: The patient's family history includes AAA (abdominal aortic aneurysm) in her sister; Alzheimer's disease (age of onset: 68) in her father; Arthritis (age of onset: 49) in her mother; Breast cancer in her paternal aunt; CVA in her maternal grandfather; Cancer in her maternal grandfather; Congenital heart disease in her daughter; Congestive Heart Failure in her mother; Dementia in her father; Diabetes in her father and sister; Diabetes (age of onset: 36) in her mother; Diabetes (age of onset: 78) in her maternal grandfather; Heart attack in her maternal aunt; Hyperlipidemia in her father; Lung cancer (age of onset: 26) in her maternal grandmother; Stroke in her father.  ROS:   Please see the history of present illness.  Additional pertinent ROS otherwise unremarkable   EKGs/Labs/Other Studies Reviewed:    The  following studies were reviewed today: Monitor 01/22/19: 7 days of data recorded on Zio monitor. Patient had a min HR of 46 bpm, max HR of 111 bpm, and avg HR of 68 bpm. Patient was in atrial fibrillation 100% of the time. No VT, high degree block, or pauses noted. Isolated ventricular ectopy was rare (<1%). There were 0 triggered events.   Monitor 02/13/18 ~3 days of data recorded on Zio monitor. No high degree block or pauses noted. Isolated atrial and ventricular ectopy was rare (<1%). There were 2 patient triggered events, one SVT and one sinus rhythm with PVC.   1 run of tachycardia occurred lasting 11 beats with a max rate of 200 bpm (avg 168 bpm). It is labeled as VT on the monitor. While VT cannot be excluded, it appears likely atrial fibrillation with rate related aberrancy. Atrial Fibrillation occurred continuously (100% burden), ranging from 51-163 bpm (avg of 92 bpm). Isolated VEs were occasional (2.7%), VE Couplets were rare (<1.0%), and VE Triplets were rare (<1.0%, 1). Ventricular Bigeminy and Trigeminy were rare, with individual episodes lasting 5 seconds or less.  Cath 02/07/18 Prox LAD lesion is 20% stenosed. LV end diastolic pressure is mildly elevated. There is moderate left ventricular systolic dysfunction. Hemodynamic findings consistent with mild pulmonary hypertension.   1. Minimal nonobstructive CAD with calcified plaque in the proximal LAD 2. Moderate LV dysfunction. EF estimated at 40-45%.  3. Mildly elevated LV filling pressures. V waves prominent on PCWP tracing 4. Mild pulmonary HTN 5. Low cardiac output. Index 2.06. 6. Mild to Moderate MR.    Plan: Optimize medical therapy for CHF. Consider repeat Echo once medical therapy optimized to reassess valve regurgitation.   Fick Cardiac Output 3.88 L/min  Fick Cardiac Output Index 2.06 (L/min)/BSA  RA A  Wave -99 mmHg  RA V Wave 11 mmHg  RA Mean 9 mmHg  RV Systolic Pressure 44 mmHg  RV Diastolic Pressure 2 mmHg  RV  EDP 9 mmHg  PA Systolic Pressure 47 mmHg  PA Diastolic Pressure 13 mmHg  PA Mean 34 mmHg  PW A Wave -99 mmHg  PW V Wave 32 mmHg  PW Mean 21 mmHg  AO Systolic Pressure 0000000 mmHg  AO Diastolic Pressure 73 mmHg  AO Mean 99 mmHg  LV Systolic Pressure 0000000 mmHg  LV Diastolic Pressure 8 mmHg  LV EDP 18 mmHg  AOp Systolic Pressure 0000000 mmHg  AOp Diastolic Pressure 67 mmHg  AOp Mean Pressure 94 mmHg  LVp Systolic Pressure 123456 mmHg  LVp Diastolic Pressure 10 mmHg  LVp EDP Pressure 15 mmHg  QP/QS 1  TPVR Index 16.45 HRUI  TSVR Index 47.91 HRUI  PVR SVR Ratio 0.1  TPVR/TSVR Ratio 0.34   Echo 01/25/18 Study Conclusions   - Left ventricle: The cavity size was normal. Systolic function was   moderately reduced. The estimated ejection fraction was in the   range of 35% to 40%. Although no diagnostic regional wall motion   abnormality was identified, this possibility cannot be completely   excluded on the basis of this study. - Aortic valve: Transvalvular velocity was within the normal range.   There was no stenosis. There was no regurgitation. - Mitral valve: Moderately calcified annulus. There was moderate   regurgitation. - Left atrium: The atrium was moderately dilated. - Right ventricle: The cavity size was mildly dilated. Wall   thickness was normal. Systolic function was normal. RV systolic   pressure (S, est): 44 mm Hg. - Right atrium: The atrium was moderately dilated. Central venous   pressure (est): 10 mm Hg. - Tricuspid valve: Dilated annulus. Structurally normal valve.   There was moderate-severe regurgitation. - Pulmonic valve: There was mild regurgitation. - Pulmonary arteries: Systolic pressure was moderately increased.   PA peak pressure: 44 mm Hg (S). - Inferior vena cava: The vessel was normal in size. The   respirophasic diameter changes were blunted (< 50%). - Pericardium, extracardiac: There was no pericardial effusion.  EKG:  EKG is personally reviewed.    05/25/21: atrial fibrillation at 84 bpm 11/07/20 atrial fibrillation at 102 bpm 11/06/19 atrial fibrillation with heart rate 93 bpm and poor R wave progression.  Recent Labs: 03/23/2021: ALT 9; BUN 22; Creatinine, Ser 1.16; Hemoglobin 14.0; Platelets 188; Potassium 4.9; Sodium 141; TSH 2.730  Recent Lipid Panel    Component Value Date/Time   CHOL 217 (H) 03/23/2021 1022   TRIG 84 03/23/2021 1022   HDL 104 03/23/2021 1022   CHOLHDL 2.1 03/23/2021 1022   CHOLHDL 2.2 09/23/2020 0940   LDLCALC 99 03/23/2021 1022   LDLCALC 91 09/23/2020 0940    Physical Exam:    VS:  BP 124/82   Pulse 84   Ht 5\' 2"  (1.575 m)   Wt 212 lb (96.2 kg)   SpO2 100%   BMI 38.78 kg/m     Wt Readings from Last 3 Encounters:  05/25/21 212 lb (96.2 kg)  02/23/21 216 lb 3.2 oz (98.1 kg)  11/07/20 220 lb 12.8 oz (100.2 kg)    GEN: Well nourished, well developed in no acute distress HEENT: Normal, moist mucous membranes NECK: No JVD CARDIAC: irregularly irregular rhythm, normal S1 and S2, no rubs or gallops. No murmur. VASCULAR: Radial and DP pulses 2+ bilaterally. No carotid bruits RESPIRATORY:  Clear to  auscultation without rales, wheezing or rhonchi  ABDOMEN: Soft, non-tender, non-distended MUSCULOSKELETAL:  Ambulates independently SKIN: Warm and dry, no edema NEUROLOGIC:  Alert and oriented x 3. No focal neuro deficits noted. PSYCHIATRIC:  Normal affect    ASSESSMENT:    1. Nonischemic cardiomyopathy ([place])   2. Chronic atrial fibrillation (HCC)   3. Essential hypertension   4. Anticoagulant long-term use   5. Cardiac risk counseling   6. Counseling on health promotion and disease prevention    PLAN:    Nonischemic cardiomyopathy with reduced EF, moderate MR, and moderate-severe TR Dyspnea on exertion -cath with minimal nonobstructive CAD -MR more mild-moderate on cath; improved MR and TR on most recent echo -tolerating entresto well. Did discuss that even with improvement in EF,  recommendations are to continue entresto given good response. -continue metoprolol succinate -overall NYHA II symptoms, with walking but not with water exercise or low impact exercise   Atrial fibrillation: now permanent  -monitor 01/2018 showed 100% burden of afib. Repeat monitor showed the same 01/2019. -see prior notes. We have discussed attempts at restoration of sinus rhythm. Amiodarone did not keep her in rhythm. She wished to pursue rate control strategy -rate control with metoprolol -CHA2DS2/VAS Stroke Risk Points=3, continue rivaroxaban for anticoagulation   Hypertension:  -at goal -Continue entresto and metoprolol   Hyperlipidemia (hypercholesterolemia):  -continue rosuvastatin, tolerating 4x/week  CV risk counseling and prevention: -recommend heart healthy/Mediterranean diet, with whole grains, fruits, vegetable, fish, lean meats, nuts, and olive oil. Limit salt. -recommend moderate walking, 3-5 times/week for 30-50 minutes each session. Aim for at least 150 minutes.week. Goal should be pace of 3 miles/hours, or walking 1.5 miles in 30 minutes -recommend avoidance of tobacco products. Avoid excess alcohol.  Follow up: 6 mos or sooner as needed  Buford Dresser, MD, PhD [place]  [facility]   Medication Adjustments/Labs and Tests Ordered: Current medicines are reviewed at length with the patient today.  Concerns regarding medicines are outlined above.  Orders Placed This Encounter  Procedures   EKG 12-Lead    Meds ordered this encounter  Medications   sacubitril-valsartan (ENTRESTO) 49-51 MG    Sig: Take 1 tablet by mouth 2 (two) times daily.    Dispense:  180 tablet    Refill:  3    Just had filled at [facility] but wants to transfer to here for next fill. May come by to ask about out of pocket cost.     Patient Instructions  Medication Instructions:  Your Physician recommend you continue on your current medication as directed.    *If you  need a refill on your cardiac medications before your next appointment, please call your pharmacy*   Lab Work: None ordered today   Testing/Procedures: None ordered today   Follow-Up: At [facility], you and your health needs are our priority.  As part of our continuing mission to provide you with exceptional heart care, we have created designated Provider Care Teams.  These Care Teams include your primary Cardiologist (physician) and Advanced Practice Providers (APPs -  Physician Assistants and Nurse Practitioners) who all work together to provide you with the care you need, when you need it.  We recommend signing up for the patient portal called "MyChart".  Sign up information is provided on this After Visit Summary.  MyChart is used to connect with patients for Virtual Visits (Telemedicine).  Patients are able to view lab/test results, encounter notes, upcoming appointments, etc.  Non-urgent messages can be sent  to your provider as well.   To learn more about what you can do with MyChart, go to [email or web address].    Your next appointment:   6 month(s)  The format for your next appointment:   In Person  Provider:   Buford Dresser, MD{   Important Information About Sugar         Signed, Buford Dresser, MD PhD 05/25/2021  [place]

## 2021-05-25 NOTE — Patient Instructions (Signed)
Medication Instructions:  ?Your Physician recommend you continue on your current medication as directed.   ? ?*If you need a refill on your cardiac medications before your next appointment, please call your pharmacy* ? ? ?Lab Work: ?None ordered today ? ? ?Testing/Procedures: ?None ordered today ? ? ?Follow-Up: ?At [facility], you and your health needs are our priority.  As part of our continuing mission to provide you with exceptional heart care, we have created designated Provider Care Teams.  These Care Teams include your primary Cardiologist (physician) and Advanced Practice Providers (APPs -  Physician Assistants and Nurse Practitioners) who all work together to provide you with the care you need, when you need it. ? ?We recommend signing up for the patient portal called "MyChart".  Sign up information is provided on this After Visit Summary.  MyChart is used to connect with patients for Virtual Visits (Telemedicine).  Patients are able to view lab/test results, encounter notes, upcoming appointments, etc.  Non-urgent messages can be sent to your provider as well.   ?To learn more about what you can do with MyChart, go to [email or web address].   ? ?Your next appointment:   ?6 month(s) ? ?The format for your next appointment:   ?In Person ? ?Provider:   ?Bridgette Christopher, MD{ ? ? ?Important Information About Sugar ? ? ? ? ? ? ?

## 2021-06-09 ENCOUNTER — Other Ambulatory Visit (HOSPITAL_BASED_OUTPATIENT_CLINIC_OR_DEPARTMENT_OTHER): Payer: Self-pay

## 2021-06-09 MED FILL — Rivaroxaban Tab 20 MG: ORAL | 30 days supply | Qty: 30 | Fill #0 | Status: AC

## 2021-06-18 ENCOUNTER — Other Ambulatory Visit (HOSPITAL_BASED_OUTPATIENT_CLINIC_OR_DEPARTMENT_OTHER): Payer: Self-pay

## 2021-06-18 MED FILL — Metoprolol Succinate Tab ER 24HR 50 MG (Tartrate Equiv): ORAL | 90 days supply | Qty: 270 | Fill #0 | Status: AC

## 2021-06-30 ENCOUNTER — Other Ambulatory Visit (HOSPITAL_BASED_OUTPATIENT_CLINIC_OR_DEPARTMENT_OTHER): Payer: Self-pay

## 2021-06-30 MED FILL — Rosuvastatin Calcium Tab 5 MG: ORAL | 84 days supply | Qty: 48 | Fill #0 | Status: AC

## 2021-07-02 DIAGNOSIS — Z961 Presence of intraocular lens: Secondary | ICD-10-CM | POA: Diagnosis not present

## 2021-07-02 DIAGNOSIS — H5000 Unspecified esotropia: Secondary | ICD-10-CM | POA: Diagnosis not present

## 2021-07-05 MED FILL — Rivaroxaban Tab 20 MG: ORAL | 30 days supply | Qty: 30 | Fill #1 | Status: AC

## 2021-07-06 ENCOUNTER — Other Ambulatory Visit (HOSPITAL_BASED_OUTPATIENT_CLINIC_OR_DEPARTMENT_OTHER): Payer: Self-pay

## 2021-07-15 ENCOUNTER — Other Ambulatory Visit (HOSPITAL_BASED_OUTPATIENT_CLINIC_OR_DEPARTMENT_OTHER): Payer: Self-pay

## 2021-08-05 MED FILL — Rivaroxaban Tab 20 MG: ORAL | 30 days supply | Qty: 30 | Fill #2 | Status: AC

## 2021-08-06 ENCOUNTER — Other Ambulatory Visit (HOSPITAL_BASED_OUTPATIENT_CLINIC_OR_DEPARTMENT_OTHER): Payer: Self-pay

## 2021-08-10 ENCOUNTER — Other Ambulatory Visit (HOSPITAL_BASED_OUTPATIENT_CLINIC_OR_DEPARTMENT_OTHER): Payer: Self-pay

## 2021-08-10 MED FILL — Rosuvastatin Calcium Tab 5 MG: ORAL | 84 days supply | Qty: 48 | Fill #1 | Status: CN

## 2021-08-10 MED FILL — Rivaroxaban Tab 20 MG: ORAL | 30 days supply | Qty: 30 | Fill #3 | Status: CN

## 2021-08-11 ENCOUNTER — Other Ambulatory Visit (HOSPITAL_BASED_OUTPATIENT_CLINIC_OR_DEPARTMENT_OTHER): Payer: Self-pay

## 2021-08-20 DIAGNOSIS — H509 Unspecified strabismus: Secondary | ICD-10-CM | POA: Diagnosis not present

## 2021-08-31 ENCOUNTER — Telehealth (HOSPITAL_BASED_OUTPATIENT_CLINIC_OR_DEPARTMENT_OTHER): Payer: Self-pay | Admitting: Cardiology

## 2021-08-31 DIAGNOSIS — I482 Chronic atrial fibrillation, unspecified: Secondary | ICD-10-CM

## 2021-08-31 MED ORDER — RIVAROXABAN 20 MG PO TABS: ORAL_TABLET | ORAL | 1 refills | Status: DC

## 2021-08-31 NOTE — Telephone Encounter (Signed)
Prescription refill request for Xarelto received.  Indication: Afib  Last office visit: 05/25/21 [name]) Weight: 96.2kg Age: 76 Scr: 1.16 (03/23/21)  CrCl: 64.26ml/min  Appropriate dose and refill sent to requested pharmacy.

## 2021-08-31 NOTE — Telephone Encounter (Signed)
Please review for refill. Thank you! 

## 2021-08-31 NOTE — Telephone Encounter (Signed)
*  STAT* If patient is at the pharmacy, call can be transferred to refill team.   1. Which medications need to be refilled? (please list name of each medication and dose if known) rivaroxaban (XARELTO) 20 MG TABS tablet  2. Which pharmacy/location (including street and city if local pharmacy) is medication to be sent to? TAKE 1 TABLET BY MOUTH EVERY DAY WITH SUPPER  3. Do they need a 30 day or 90 day supply? 90

## 2021-09-09 ENCOUNTER — Other Ambulatory Visit (HOSPITAL_BASED_OUTPATIENT_CLINIC_OR_DEPARTMENT_OTHER): Payer: Self-pay

## 2021-09-09 MED FILL — Metoprolol Succinate Tab ER 24HR 50 MG (Tartrate Equiv): ORAL | 83 days supply | Qty: 249 | Fill #1 | Status: AC

## 2021-09-17 ENCOUNTER — Other Ambulatory Visit (HOSPITAL_BASED_OUTPATIENT_CLINIC_OR_DEPARTMENT_OTHER): Payer: Self-pay

## 2021-09-17 MED FILL — Rosuvastatin Calcium Tab 5 MG: ORAL | 84 days supply | Qty: 48 | Fill #1 | Status: AC

## 2021-09-23 DIAGNOSIS — Z1231 Encounter for screening mammogram for malignant neoplasm of breast: Secondary | ICD-10-CM | POA: Diagnosis not present

## 2021-09-28 ENCOUNTER — Encounter (HOSPITAL_BASED_OUTPATIENT_CLINIC_OR_DEPARTMENT_OTHER): Payer: Self-pay | Admitting: Obstetrics & Gynecology

## 2021-09-30 DIAGNOSIS — R928 Other abnormal and inconclusive findings on diagnostic imaging of breast: Secondary | ICD-10-CM | POA: Diagnosis not present

## 2021-09-30 DIAGNOSIS — R921 Mammographic calcification found on diagnostic imaging of breast: Secondary | ICD-10-CM | POA: Diagnosis not present

## 2021-09-30 DIAGNOSIS — R922 Inconclusive mammogram: Secondary | ICD-10-CM | POA: Diagnosis not present

## 2021-10-09 ENCOUNTER — Other Ambulatory Visit: Payer: Self-pay

## 2021-10-09 ENCOUNTER — Emergency Department (HOSPITAL_BASED_OUTPATIENT_CLINIC_OR_DEPARTMENT_OTHER)
Admission: EM | Admit: 2021-10-09 | Discharge: 2021-10-09 | Disposition: A | Discharge status: Home / Self Care | Payer: Medicare Other | Attending: Emergency Medicine | Admitting: Emergency Medicine

## 2021-10-09 ENCOUNTER — Emergency Department (HOSPITAL_BASED_OUTPATIENT_CLINIC_OR_DEPARTMENT_OTHER): Payer: Medicare Other

## 2021-10-09 ENCOUNTER — Encounter (HOSPITAL_BASED_OUTPATIENT_CLINIC_OR_DEPARTMENT_OTHER): Payer: Self-pay | Admitting: *Deleted

## 2021-10-09 DIAGNOSIS — Z79899 Other long term (current) drug therapy: Secondary | ICD-10-CM | POA: Insufficient documentation

## 2021-10-09 DIAGNOSIS — Z91040 Latex allergy status: Secondary | ICD-10-CM | POA: Insufficient documentation

## 2021-10-09 DIAGNOSIS — U071 COVID-19: Secondary | ICD-10-CM | POA: Insufficient documentation

## 2021-10-09 DIAGNOSIS — R059 Cough, unspecified: Secondary | ICD-10-CM | POA: Diagnosis present

## 2021-10-09 DIAGNOSIS — J069 Acute upper respiratory infection, unspecified: Secondary | ICD-10-CM | POA: Diagnosis not present

## 2021-10-09 LAB — BASIC METABOLIC PANEL
Anion gap: 11 (ref 5–15)
BUN: 14 mg/dL (ref 8–23)
CO2: 28 mmol/L (ref 22–32)
Calcium: 9.4 mg/dL (ref 8.9–10.3)
Chloride: 101 mmol/L (ref 98–111)
Creatinine, Ser: 0.85 mg/dL (ref 0.44–1.00)
GFR, Estimated: >60 mL/min (ref >60)
Glucose, Bld: 107 mg/dL — ABNORMAL HIGH (ref 70–99)
Potassium: 3.8 mmol/L (ref 3.5–5.1)
Sodium: 140 mmol/L (ref 135–145)

## 2021-10-09 LAB — CBC WITH DIFFERENTIAL/PLATELET
Abs Immature Granulocytes: 0.01 10*3/uL (ref 0.00–0.07)
Basophils Absolute: 0 10*3/uL (ref 0.0–0.1)
Basophils Relative: 1 %
Eosinophils Absolute: 0 10*3/uL (ref 0.0–0.5)
Eosinophils Relative: 1 %
HCT: 39.5 % (ref 36.0–46.0)
Hemoglobin: 13.1 g/dL (ref 12.0–15.0)
Immature Granulocytes: 0 %
Lymphocytes Relative: 41 %
Lymphs Abs: 1.6 10*3/uL (ref 0.7–4.0)
MCH: 32.4 pg (ref 26.0–34.0)
MCHC: 33.2 g/dL (ref 30.0–36.0)
MCV: 97.8 fL (ref 80.0–100.0)
Monocytes Absolute: 0.5 10*3/uL (ref 0.1–1.0)
Monocytes Relative: 13 %
Neutro Abs: 1.8 10*3/uL (ref 1.7–7.7)
Neutrophils Relative %: 44 %
Platelets: 145 10*3/uL — ABNORMAL LOW (ref 150–400)
RBC: 4.04 MIL/uL (ref 3.87–5.11)
RDW: 14.1 % (ref 11.5–15.5)
WBC: 4 10*3/uL (ref 4.0–10.5)
nRBC: 0 % (ref 0.0–0.2)

## 2021-10-09 NOTE — ED Provider Notes (Signed)
[facility] EMERGENCY DEPT Provider Note   CSN: [id number] Arrival date & time: 10/09/21  1557     History  Chief Complaint  Patient presents with   Covid Positive    Jennifer Mcclain is a 76 y.o. female.  HPI 76 year old female presents with COVID-19.  She states that on 9/23 she developed URI symptoms.  Having cough, congestion, etc.  Took a COVID test that day and it was negative.  Symptoms have gradually improved though she still feels a little congestion and cough.  Today she felt really tired so she took a COVID test again and it was positive.  She came here for treatment.  No leg swelling.  She has multiple comorbidities which include chronic A-fib, systolic dysfunction, and hypertension.  Home Medications Prior to Admission medications   Medication Sig Start Date End Date Taking? Authorizing Provider  acetaminophen (TYLENOL) 650 MG CR tablet Take 650 mg by mouth in the morning, at noon, and at bedtime.    [provider]  Calcium Carb-Cholecalciferol (CALCIUM 600/VITAMIN D3 PO) Take 1 tablet by mouth 2 (two) times daily.    [provider]  Cholecalciferol (VITAMIN D3) 25 MCG (1000 UT) CAPS Take 1,000 Units by mouth daily.     [provider]  Loratadine (CLARITIN PO) Take by mouth daily.    [provider]  metoprolol succinate (TOPROL-XL) 50 MG 24 hr tablet Take 2 tablets (100 mg total) by mouth in the morning AND 1 tablet (50 mg total) every evening. 12/15/20 12/10/21  Buford Dresser, MD  Multiple Vitamins-Minerals (OCUVITE PO) Take 1 tablet by mouth daily.     [provider]  mupirocin ointment (BACTROBAN) 2 % Apply to affected area 3 times daily for 7 days. 02/23/21   Orma Render, NP  polycarbophil (FIBERCON) 625 MG tablet Take 625 mg by mouth daily.    [provider]  Probiotic Product (Ritchey) Take 1 capsule by mouth daily.     [provider]  rivaroxaban  (XARELTO) 20 MG TABS tablet TAKE 1 TABLET BY MOUTH EVERY DAY WITH SUPPER 08/31/21   Buford Dresser, MD  rosuvastatin (CRESTOR) 5 MG tablet TAKE 1 TABLET BY MOUTH 4 TIMES A WEEK 01/08/21   Buford Dresser, MD  sacubitril-valsartan (ENTRESTO) 49-51 MG Take 1 tablet by mouth 2 (two) times daily. 05/25/21   Buford Dresser, MD  sacubitril-valsartan (ENTRESTO) 49-51 MG Take 1 tablet by mouth 2 times daily 12/22/20     Zinc 50 MG TABS Take 1 tablet by mouth daily.    [provider]      Allergies    Latex, Adhesive [tape], Grass extracts [gramineae pollens], Lactose intolerance (gi), Naprosyn [naproxen], Coq10 [coenzyme q10], and Zocor [simvastatin]    Review of Systems   Review of Systems  Constitutional:  Positive for fatigue. Negative for fever.  HENT:  Positive for congestion. Negative for sore throat.   Respiratory:  Positive for cough.   Gastrointestinal:  Negative for vomiting.    Physical Exam Updated Vital Signs BP (!) 161/106 (BP Location: Right Arm)   Pulse (!) 54   Temp 97.9 F (36.6 C) (Oral)   Resp (!) 99   Wt 96.2 kg   SpO2 98%   BMI 38.78 kg/m  Physical Exam Vitals and nursing note reviewed.  Constitutional:      General: She is not in acute distress.    Appearance: She is well-developed. She is not ill-appearing or diaphoretic.  HENT:  Head: Normocephalic and atraumatic.  Cardiovascular:     Rate and Rhythm: Normal rate. Rhythm irregular.     Heart sounds: Normal heart sounds.  Pulmonary:     Effort: Pulmonary effort is normal.     Breath sounds: Normal breath sounds. No wheezing.  Abdominal:     General: There is no distension.  Musculoskeletal:     Right lower leg: No edema.     Left lower leg: No edema.  Skin:    General: Skin is warm and dry.  Neurological:     Mental Status: She is alert.     ED Results / Procedures / Treatments   Labs (all labs ordered are listed, but only abnormal results are displayed) Labs  Reviewed  BASIC METABOLIC PANEL - Abnormal; Notable for the following components:      Result Value   Glucose, Bld 107 (*)    All other components within normal limits  CBC WITH DIFFERENTIAL/PLATELET - Abnormal; Notable for the following components:   Platelets 145 (*)    All other components within normal limits    EKG None  Radiology DG Chest Port 1 View  Result Date: 10/09/2021 CLINICAL DATA:  Upper respiratory infection and positive COVID test. EXAM: PORTABLE CHEST 1 VIEW COMPARISON:  10/29/2019 FINDINGS: Increase in cardiomegaly since the prior study. Increased prominence of pulmonary interstitium may reflect mild pulmonary interstitial edema or interstitial pneumonia. No overt airspace edema, pleural fluid or pneumothorax identified. IMPRESSION: Increase in cardiomegaly since the prior study. Increased prominence of pulmonary interstitium may reflect mild pulmonary interstitial edema or interstitial pneumonia. Electronically Signed   By: Aletta Edouard M.D.   On: 10/09/2021 19:16    Procedures Procedures    Medications Ordered in ED Medications - No data to display  ED Course/ Medical Decision Making/ A&P                           Medical Decision Making Amount and/or Complexity of Data Reviewed Labs: ordered. Radiology: ordered.   Labs reviewed/interpreted by myself, normal WBC, unremarkable electrolytes, normal hemoglobin.  No clear cause for her fatigue earlier today which is improved.  Chest x-ray images viewed/interpreted by myself and there is no obvious lobar pneumonia.  My suspicion is that she truly had COVID 7 days ago rather than a nonspecific URI and then later got COVID today.  I discussed this with her and husband.  Due to this, I am not sure that Paxlovid her molnupiravir would be beneficial this far out. This is in spite of her having multiple comorbidities.  She is okay with this and otherwise we discussed supportive care and return precautions.  I do not  think antibiotics are warranted.  Clinically she does not appear to have acute CHF.        Final Clinical Impression(s) / ED Diagnoses Final diagnoses:  COVID-19    Rx / DC Orders ED Discharge Orders     None         Sherwood Gambler, MD 10/09/21 2100

## 2021-10-09 NOTE — ED Triage Notes (Signed)
Pt is here as she took a home covid test today which was positive.  Pt reports that she has had URI for about a week. VSS at this time

## 2021-10-09 NOTE — ED Notes (Signed)
Pt tested Covid positive at home and came to ED to get anti-viral medications. Pt has no complaints at this time.

## 2021-10-09 NOTE — ED Notes (Signed)
Called lab to add cbc to tubes already sent

## 2021-10-09 NOTE — Discharge Instructions (Signed)
If you develop high fever, severe cough or cough with blood, trouble breathing, severe headache, neck pain/stiffness, vomiting, or any other new/concerning symptoms then return to the ER for evaluation  

## 2021-10-12 ENCOUNTER — Other Ambulatory Visit (HOSPITAL_BASED_OUTPATIENT_CLINIC_OR_DEPARTMENT_OTHER): Payer: Self-pay

## 2021-10-19 ENCOUNTER — Telehealth: Payer: Self-pay

## 2021-10-19 NOTE — Telephone Encounter (Signed)
     Patient  visit on 10/09/2021  at [facility] was for COVID-19.  Have you been able to follow up with your primary care physician? Patient will follow up with physician if she feels worse.  The patient was or was not able to obtain any needed medicine or equipment. No meds were prescribed.  Are there diet recommendations that you are having difficulty following? No  Patient expresses understanding of discharge instructions and education provided has no other needs at this time.    [facility] Guide   [email or web address]  ?? [phone]   Website: [email or web address]  [email or web address]

## 2021-10-31 DIAGNOSIS — Z23 Encounter for immunization: Secondary | ICD-10-CM | POA: Diagnosis not present

## 2021-11-09 ENCOUNTER — Other Ambulatory Visit (HOSPITAL_BASED_OUTPATIENT_CLINIC_OR_DEPARTMENT_OTHER): Payer: Self-pay

## 2021-11-12 ENCOUNTER — Encounter: Payer: Medicare Other | Admitting: Nurse Practitioner

## 2021-11-26 ENCOUNTER — Encounter (HOSPITAL_BASED_OUTPATIENT_CLINIC_OR_DEPARTMENT_OTHER): Payer: Self-pay | Admitting: Cardiology

## 2021-11-26 ENCOUNTER — Other Ambulatory Visit (HOSPITAL_BASED_OUTPATIENT_CLINIC_OR_DEPARTMENT_OTHER): Payer: Self-pay

## 2021-11-26 ENCOUNTER — Ambulatory Visit (INDEPENDENT_AMBULATORY_CARE_PROVIDER_SITE_OTHER): Payer: Medicare Other | Admitting: Cardiology

## 2021-11-26 VITALS — BP 134/78 | HR 95 | Ht 62.0 in | Wt 210.0 lb

## 2021-11-26 DIAGNOSIS — E7800 Pure hypercholesterolemia, unspecified: Secondary | ICD-10-CM | POA: Diagnosis not present

## 2021-11-26 DIAGNOSIS — I482 Chronic atrial fibrillation, unspecified: Secondary | ICD-10-CM

## 2021-11-26 DIAGNOSIS — I2510 Atherosclerotic heart disease of native coronary artery without angina pectoris: Secondary | ICD-10-CM

## 2021-11-26 DIAGNOSIS — I428 Other cardiomyopathies: Secondary | ICD-10-CM | POA: Diagnosis not present

## 2021-11-26 DIAGNOSIS — I2583 Coronary atherosclerosis due to lipid rich plaque: Secondary | ICD-10-CM | POA: Diagnosis not present

## 2021-11-26 MED ORDER — ENTRESTO 49-51 MG PO TABS
1.0000 | ORAL_TABLET | Freq: Two times a day (BID) | ORAL | 3 refills | Status: DC
  Filled 2021-11-26 – 2021-12-20 (×3): qty 180, 90d supply, fill #0
  Filled 2022-03-16: qty 180, 90d supply, fill #1
  Filled 2022-03-16 – 2022-03-18 (×2): qty 60, 30d supply, fill #1
  Filled 2022-04-15: qty 60, 30d supply, fill #2
  Filled 2022-05-12: qty 60, 30d supply, fill #3
  Filled 2022-06-09: qty 60, 30d supply, fill #4
  Filled 2022-07-11: qty 60, 30d supply, fill #5
  Filled 2022-08-07 – 2022-08-09 (×2): qty 60, 30d supply, fill #6
  Filled 2022-09-06: qty 60, 30d supply, fill #7
  Filled 2022-10-06: qty 60, 30d supply, fill #8
  Filled 2022-11-03: qty 60, 30d supply, fill #9

## 2021-11-26 MED ORDER — METOPROLOL SUCCINATE ER 50 MG PO TB24
ORAL_TABLET | ORAL | 3 refills | Status: DC
  Filled 2021-11-26 – 2021-12-06 (×2): qty 270, 90d supply, fill #0
  Filled 2022-03-03: qty 270, 90d supply, fill #1
  Filled 2022-06-01: qty 270, 90d supply, fill #2
  Filled 2022-08-30: qty 270, 90d supply, fill #3

## 2021-11-26 MED ORDER — RIVAROXABAN 20 MG PO TABS: ORAL_TABLET | ORAL | 3 refills | Status: DC

## 2021-11-26 MED ORDER — ROSUVASTATIN CALCIUM 5 MG PO TABS
5.0000 mg | ORAL_TABLET | ORAL | 11 refills | Status: DC
  Filled 2021-11-26: qty 48, 84d supply, fill #0
  Filled 2021-12-06: qty 16, 28d supply, fill #0
  Filled 2022-01-13: qty 16, 28d supply, fill #1
  Filled 2022-02-06: qty 16, 28d supply, fill #2
  Filled 2022-03-06 (×2): qty 16, 28d supply, fill #3
  Filled 2022-04-03: qty 16, 28d supply, fill #4
  Filled 2022-04-29: qty 16, 28d supply, fill #5
  Filled 2022-05-27 (×2): qty 16, 28d supply, fill #6
  Filled 2022-06-23: qty 16, 28d supply, fill #7
  Filled 2022-07-17: qty 16, 28d supply, fill #8
  Filled 2022-08-14: qty 16, 28d supply, fill #9
  Filled 2022-09-11: qty 16, 28d supply, fill #10
  Filled 2022-10-07: qty 16, 28d supply, fill #11

## 2021-11-26 NOTE — Patient Instructions (Addendum)
Medication Instructions:  Your physician recommends that you continue on your current medications as directed. Please refer to the Current Medication list given to you today.   Labwork: NONE  Testing/Procedures: NONE  Follow-Up: 06/01/2022 4:00 PM WITH DR [name]

## 2021-11-26 NOTE — Progress Notes (Signed)
**Note Jennifer-Identified via Obfuscation** Cardiology Office Note:    Date:  11/29/2021   ID:  Jennifer Mcclain, DOB 1945/02/14, MRN [id number]  PCP:  Jennifer Peru, Raymond J, MD  Cardiologist:  Jennifer Red, MD PhD  Referring MD: Jennifer Eth, NP   CC: follow up  History of Present Illness:    Jennifer Mcclain is a 76 y.o. female with a hx of permanent atrial fibrillation on DOAC, nonischemic cardiomyopathy, hyperlipidemia, hypertension who is seen for follow up. Initial consult with me was on 01/17/18 for atrial fibrillation.  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 Had an ablation done, but was told part of it was too close to the His bundle and so the EP cardiologist ablated all around it but not in the bundle. Moved to [place] about 10 years ago, was told there was no evidence of WPW. Daughter passed away from heart disease; had a kink in one of her arteries, died at age 62. Since coming to [facility], had echo with abnormal EF, moderate MR, moderate-severe TR. Cath did not show significant CAD. Monitor x2 showed 100% afib, now permanent based on patient preference for rate control only.  At her last visit she had returned to exercising in the pool after recovering from her cataract surgery. Her home blood pressures were well controlled. Referred to [facility] for GFR 50. Believes remotely she was told she may have a kidney cyst. No known proteinuria.  She presented to the ED 10/09/21 after testing positive for COVID at home. She had initially developed URI symptoms on 10/03/21, but tested negative for COVID at that time. It was suspected she truly had COVID for those 7 days. Clinically she did not appear to have acute CHF. She was instructed in supportive care and return precautions.  Today, she states she is feeling okay. Since her last visit she underwent cataract surgery. However, she still has double vision.  Recently she has noticed more numbness in her fingers if she is using her phone or I-Pad  for a time. Previously she developed a trigger finger.  Also she endorses some LE edema, R>L. She is wearing her compression socks today.  Once in a while she experiences back pain. She would like to use Salonpas patches for treatment and asks about recommendations from a cardiac perspective.  Her weight is down 2 lbs since her last visit. She continues to exercise with her water aerobic classes. Generally she does well without feeling too limited. No anginal symptoms but she will be short of breath if she walks a lot.  She is compliant with her Rosuvastatin 4 times a week as prescribed.  She endorses allergies with frequent post-nasal drip.  She denies any palpitations, or chest pain. No lightheadedness, headaches, syncope, orthopnea, or PND.   Past Medical History:  Diagnosis Date   Achilles tendinitis    Per [facility] New Patient packet    Atrial fibrillation [facility])    Per records from [facility] in [place], [place]    Baker's cyst of knee, left    with resulting lower extremity edema   Benign cyst of breast, left    Per records from [facility] in [place], [place]. 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 Per records from [facility] in [place], [place]    H/O calculus of kidney during pregnancy    Jennifer Ang, MD   H/O hysterectomy for benign disease    Jennifer Ang, MD   History of fibula fracture     Right, Per records from [facility] in [place], [place]    History of torn meniscus of right knee    Per records from [facility] in [place], [place]    Humerus fracture    left leg- due to fall   Humerus fracture    Resulting from a fall, Per records from [facility] in [place], [place]    Hypercalcemia    Per records from [facility] in [place], [place]    Hypertension    Per [facility] New Patient packet    Kidney stone on left side    Per records from [facility] in [place], [place]    Lactose intolerance    Per [facility] New Patient packet    Multiple actinic keratoses    Jennifer Ang, MD   Myalgia due to statin    Per records from [facility] in [place], [place]. Due to crestor    Non-toxic multinodular goiter 01/21/2017   Jennifer Ang, MD   Normocytic anemia    Jennifer Ang, MD   Obstructive sleep apnea syndrome    Per records from [facility] in [place], [place]. Patient did not tolerate CPAP machine    Onychomycosis    Per records from [facility] in [place], [place]    Osteoarthritis    Per [facility] New Patient packet    Osteopenia    Per records from [facility] in [place], [place]. Treated in the past with antiresorptive therapy   Renal cyst 10/11/2012   Simple on CT Scan 10/2012. Per records from [facility] in [place], [place]    Sensorineural hearing loss (SNHL), bilateral 01/28/2017   Jennifer Mcclain , MD   Umbilical hernia    Per [facility] New Patient packet    Vitamin D deficiency    Jennifer Ang, MD    Past Surgical History:  Procedure Laterality Date   ABDOMINAL HYSTERECTOMY  1989   One ovary, Per [facility] New Patient packet    ABLATION  1996   Radical, Per [facility] New Patient packet    BREAST CYST ASPIRATION Bilateral    numerous, over several years per pt, always benign   COLONOSCOPY  2019   Dr.Melissa Fayrene Fearing, Per [facility] New Patient packet    KNEE ARTHROSCOPY W/ MENISCAL REPAIR  2002   Per [facility]  New Patient packet    MOUTH SURGERY     Dental Implant    REPLACEMENT TOTAL KNEE Left 2015   Per [facility] New Patient packet   RIGHT/LEFT HEART CATH AND CORONARY ANGIOGRAPHY N/A 02/07/2018   Procedure: RIGHT/LEFT HEART CATH AND CORONARY ANGIOGRAPHY;  Surgeon: Swazil[place], Peter M, MD;  Location: [facility] INVASIVE CV LAB;  Service: Cardiovascular;  Laterality: N/A;   TONSILLECTOMY  1951   Per [facility] New Patient packet  TOOTH EXTRACTION  01/12/2019   TOOTH EXTRACTION  06/05/2019   Preparing for dental implant     Current Medications: Current Outpatient Medications on File Prior to Visit  Medication Sig   acetaminophen (TYLENOL) 650 MG CR tablet Take 650 mg by mouth in the morning, at noon, and at bedtime.   Calcium Carb-Cholecalciferol (CALCIUM 600/VITAMIN D3 PO) Take 1 tablet by mouth 2 (two) times daily.   Cholecalciferol (VITAMIN D3) 25 MCG (1000 UT) CAPS Take 1,000 Units by mouth daily.    Loratadine (CLARITIN PO) Take by mouth daily.   Multiple Vitamins-Minerals (OCUVITE PO) Take 1 tablet by mouth daily.    polycarbophil (FIBERCON) 625 MG tablet Take 625 mg by mouth daily.   Probiotic Product (PHILLIPS COLON HEALTH PO) Take 1 capsule by mouth daily.    No current facility-administered medications on file prior to visit.     Allergies:   Latex, Adhesive [tape], Grass extracts [gramineae pollens], Lactose intolerance (gi), Naprosyn [naproxen], Coq10 [coenzyme q10], and Zocor [simvastatin]   Social History   Tobacco Use   Smoking status: Never   Smokeless tobacco: Never  Vaping Use   Vaping Use: Never used  Substance Use Topics   Alcohol use: Yes    Comment: 2-3 a month - social   Drug use: Never    Family History: The patient's family history includes AAA (abdominal aortic aneurysm) in her sister; Alzheimer's disease (age of onset: 36) in her father; Arthritis (age of onset: 38) in her mother; Breast cancer in her paternal aunt; CVA in her maternal grandfather; Cancer in her maternal  grandfather; Congenital heart disease in her daughter; Congestive Heart Failure in her mother; Dementia in her father; Diabetes in her father and sister; Diabetes (age of onset: 15) in her mother; Diabetes (age of onset: 82) in her maternal grandfather; Heart attack in her maternal aunt; Hyperlipidemia in her father; Lung cancer (age of onset: 51) in her maternal grandmother; Stroke in her father.  ROS:   Please see the history of present illness.   (+) Double vision (+) Numbness of fingers (+) Back pain (+) Exertional shortness of breath Additional pertinent ROS otherwise unremarkable   EKGs/Labs/Other Studies Reviewed:    The following studies were reviewed today:  Monitor 01/22/19: 7 days of data recorded on Zio monitor. Patient had a min HR of 46 bpm, max HR of 111 bpm, and avg HR of 68 bpm. Patient was in atrial fibrillation 100% of the time. No VT, high degree block, or pauses noted. Isolated ventricular ectopy was rare (<1%). There were 0 triggered events.   Monitor 02/13/18 ~3 days of data recorded on Zio monitor. No high degree block or pauses noted. Isolated atrial and ventricular ectopy was rare (<1%). There were 2 patient triggered events, one SVT and one sinus rhythm with PVC.   1 run of tachycardia occurred lasting 11 beats with a max rate of 200 bpm (avg 168 bpm). It is labeled as VT on the monitor. While VT cannot be excluded, it appears likely atrial fibrillation with rate related aberrancy. Atrial Fibrillation occurred continuously (100% burden), ranging from 51-163 bpm (avg of 92 bpm). Isolated VEs were occasional (2.7%), VE Couplets were rare (<1.0%), and VE Triplets were rare (<1.0%, 1). Ventricular Bigeminy and Trigeminy were rare, with individual episodes lasting 5 seconds or less.  Cath 02/07/18 Prox LAD lesion is 20% stenosed. LV end diastolic pressure is mildly elevated. There is moderate left ventricular systolic dysfunction. Hemodynamic findings consistent with mild  pulmonary hypertension.   1. Minimal nonobstructive CAD with calcified plaque in the proximal LAD 2. Moderate LV dysfunction. EF estimated at 40-45%.  3. Mildly elevated LV filling pressures. V waves prominent on PCWP tracing 4. Mild pulmonary HTN 5. Low cardiac output. Index 2.06. 6. Mild to Moderate MR.    Plan: Optimize medical therapy for CHF. Consider repeat Echo once medical therapy optimized to reassess valve regurgitation.   Fick Cardiac Output 3.88 L/min  Fick Cardiac Output Index 2.06 (L/min)/BSA  RA A Wave -99 mmHg  RA V Wave 11 mmHg  RA Mean 9 mmHg  RV Systolic Pressure 44 mmHg  RV Diastolic Pressure 2 mmHg  RV EDP 9 mmHg  PA Systolic Pressure 47 mmHg  PA Diastolic Pressure 13 mmHg  PA Mean 34 mmHg  PW A Wave -99 mmHg  PW V Wave 32 mmHg  PW Mean 21 mmHg  AO Systolic Pressure 137 mmHg  AO Diastolic Pressure 73 mmHg  AO Mean 99 mmHg  LV Systolic Pressure 137 mmHg  LV Diastolic Pressure 8 mmHg  LV EDP 18 mmHg  AOp Systolic Pressure 138 mmHg  AOp Diastolic Pressure 67 mmHg  AOp Mean Pressure 94 mmHg  LVp Systolic Pressure 148 mmHg  LVp Diastolic Pressure 10 mmHg  LVp EDP Pressure 15 mmHg  QP/QS 1  TPVR Index 16.45 HRUI  TSVR Index 47.91 HRUI  PVR SVR Ratio 0.1  TPVR/TSVR Ratio 0.34   Echo 01/25/18 Study Conclusions   - Left ventricle: The cavity size was normal. Systolic function was   moderately reduced. The estimated ejection fraction was in the   range of 35% to 40%. Although no diagnostic regional wall motion   abnormality was identified, this possibility cannot be completely   excluded on the basis of this study. - Aortic valve: Transvalvular velocity was within the normal range.   There was no stenosis. There was no regurgitation. - Mitral valve: Moderately calcified annulus. There was moderate   regurgitation. - Left atrium: The atrium was moderately dilated. - Right ventricle: The cavity size was mildly dilated. Wall   thickness was normal.  Systolic function was normal. RV systolic   pressure (S, est): 44 mm Hg. - Right atrium: The atrium was moderately dilated. Central venous   pressure (est): 10 mm Hg. - Tricuspid valve: Dilated annulus. Structurally normal valve.   There was moderate-severe regurgitation. - Pulmonic valve: There was mild regurgitation. - Pulmonary arteries: Systolic pressure was moderately increased.   PA peak pressure: 44 mm Hg (S). - Inferior vena cava: The vessel was normal in size. The   respirophasic diameter changes were blunted (< 50%). - Pericardium, extracardiac: There was no pericardial effusion.  EKG:  EKG is personally reviewed.   11/26/2021:  not ordered today 05/25/21: atrial fibrillation at 84 bpm 11/07/20 atrial fibrillation at 102 bpm 11/06/19 atrial fibrillation with heart rate 93 bpm and poor R wave progression.  Recent Labs: 03/23/2021: ALT 9; TSH 2.730 10/09/2021: BUN 14; Creatinine, Ser 0.85; Hemoglobin 13.1; Platelets 145; Potassium 3.8; Sodium 140   Recent Lipid Panel    Component Value Date/Time   CHOL 217 (H) 03/23/2021 1022   TRIG 84 03/23/2021 1022   HDL 104 03/23/2021 1022   CHOLHDL 2.1 03/23/2021 1022   CHOLHDL 2.2 09/23/2020 0940   LDLCALC 99 03/23/2021 1022   LDLCALC 91 09/23/2020 0940    Physical Exam:    VS:  BP 134/78   Pulse 95   Ht 5\' 2"  (1.575 m)  Wt 210 lb (95.3 kg)   BMI 38.41 kg/m     Wt Readings from Last 3 Encounters:  11/26/21 210 lb (95.3 kg)  10/09/21 212 lb (96.2 kg)  05/25/21 212 lb (96.2 kg)    GEN: Well nourished, well developed in no acute distress HEENT: Normal, moist mucous membranes NECK: No JVD CARDIAC: irregularly irregular rhythm, normal S1 and S2, no rubs or gallops. 1/6 systolic murmur. VASCULAR: Radial and DP pulses 2+ bilaterally. No carotid bruits RESPIRATORY:  Clear to auscultation without rales, wheezing or rhonchi  ABDOMEN: Soft, non-tender, non-distended MUSCULOSKELETAL:  Ambulates independently SKIN: Warm and  dry, trace LE edema R>L NEUROLOGIC:  Alert and oriented x 3. No focal neuro deficits noted. PSYCHIATRIC:  Normal affect    ASSESSMENT:    1. Pure hypercholesterolemia   2. Nonischemic cardiomyopathy (HCC) Chronic  3. Chronic atrial fibrillation (HCC) Chronic  4. Chronic atrial fibrillation (HCC)   5. Nonischemic cardiomyopathy (HCC)     PLAN:    Nonischemic cardiomyopathy with reduced EF, moderate MR, and moderate-severe TR Dyspnea on exertion -cath with minimal nonobstructive CAD -MR more mild-moderate on cath; improved MR and TR on most recent echo -tolerating entresto well. Did discuss that even with improvement in EF, recommendations are to continue entresto given good response. -continue metoprolol succinate -overall NYHA II symptoms, with walking but not with water exercise or low impact exercise   Atrial fibrillation: permanent  -monitor 01/2018 showed 100% burden of afib. Repeat monitor showed the same 01/2019. -see prior notes. We have discussed attempts at restoration of sinus rhythm. Amiodarone did not keep her in rhythm. She wished to pursue rate control strategy -rate control with metoprolol -CHA2DS2/VAS Stroke Risk Points=3, continue rivaroxaban for anticoagulation   Hypertension:  -at goal -Continue entresto and metoprolol   Hyperlipidemia (hypercholesterolemia):  -continue rosuvastatin, tolerating 4x/week  CV risk counseling and prevention: -recommend heart healthy/Mediterranean diet, with whole grains, fruits, vegetable, fish, lean meats, nuts, and olive oil. Limit salt. -recommend moderate walking, 3-5 times/week for 30-50 minutes each session. Aim for at least 150 minutes.week. Goal should be pace of 3 miles/hours, or walking 1.5 miles in 30 minutes -recommend avoidance of tobacco products. Avoid excess alcohol.  Follow up: 6 mos or sooner as needed  Jennifer Red, MD, PhD [place]  [facility]   Medication Adjustments/Labs and Tests  Ordered: Current medicines are reviewed at length with the patient today.  Concerns regarding medicines are outlined above.   No orders of the defined types were placed in this encounter.  Meds ordered this encounter  Medications   metoprolol succinate (TOPROL-XL) 50 MG 24 hr tablet    Sig: Take 2 tablets (100 mg total) by mouth in the morning AND 1 tablet (50 mg total) every evening.    Dispense:  270 tablet    Refill:  3   rosuvastatin (CRESTOR) 5 MG tablet    Sig: Take 1 tablet (5 mg total) by mouth 4 (four) times a week.    Dispense:  16 tablet    Refill:  11   sacubitril-valsartan (ENTRESTO) 49-51 MG    Sig: Take 1 tablet by mouth 2 (two) times daily.    Dispense:  180 tablet    Refill:  3    Just had filled at [facility] but wants to transfer to here for next fill. May come by to ask about out of pocket cost.   rivaroxaban (XARELTO) 20 MG TABS tablet    Sig: TAKE 1 TABLET BY  MOUTH EVERY DAY WITH SUPPER    Dispense:  90 tablet    Refill:  3    90 day supply   Patient Instructions  Medication Instructions:  Your physician recommends that you continue on your current medications as directed. Please refer to the Current Medication list given to you today.   Labwork: NONE  Testing/Procedures: NONE  Follow-Up: 06/01/2022 4:00 PM WITH DR [name]    I,Mathew Stumpf,acting as a scribe for Jennifer Red, MD.,have documented all relevant documentation on the behalf of Jennifer Red, MD,as directed by  Jennifer Red, MD while in the presence of Jennifer Red, MD.  I, Jennifer Red, MD, have reviewed all documentation for this visit. The documentation on 11/29/21 for the exam, diagnosis, procedures, and orders are all accurate and complete.   Signed, Jennifer Red, MD PhD 11/29/2021  [facility]

## 2021-11-29 ENCOUNTER — Encounter (HOSPITAL_BASED_OUTPATIENT_CLINIC_OR_DEPARTMENT_OTHER): Payer: Self-pay | Admitting: Cardiology

## 2021-12-01 ENCOUNTER — Other Ambulatory Visit (HOSPITAL_BASED_OUTPATIENT_CLINIC_OR_DEPARTMENT_OTHER): Payer: Self-pay

## 2021-12-07 ENCOUNTER — Other Ambulatory Visit (HOSPITAL_BASED_OUTPATIENT_CLINIC_OR_DEPARTMENT_OTHER): Payer: Self-pay

## 2021-12-08 ENCOUNTER — Other Ambulatory Visit (HOSPITAL_BASED_OUTPATIENT_CLINIC_OR_DEPARTMENT_OTHER): Payer: Self-pay

## 2021-12-20 ENCOUNTER — Other Ambulatory Visit (HOSPITAL_BASED_OUTPATIENT_CLINIC_OR_DEPARTMENT_OTHER): Payer: Self-pay

## 2021-12-21 ENCOUNTER — Other Ambulatory Visit: Payer: Self-pay

## 2021-12-21 ENCOUNTER — Other Ambulatory Visit (HOSPITAL_BASED_OUTPATIENT_CLINIC_OR_DEPARTMENT_OTHER): Payer: Self-pay

## 2022-01-22 ENCOUNTER — Encounter (HOSPITAL_BASED_OUTPATIENT_CLINIC_OR_DEPARTMENT_OTHER): Payer: Self-pay | Admitting: Family Medicine

## 2022-01-27 ENCOUNTER — Telehealth (HOSPITAL_BASED_OUTPATIENT_CLINIC_OR_DEPARTMENT_OTHER): Payer: Self-pay | Admitting: Family Medicine

## 2022-01-27 NOTE — Telephone Encounter (Signed)
Spoke with pt about if she was staying at [facility] or following [facility]. Pt wanting to stay at [facility] wanting to see FNP Carmell Austria. Will place on wait list to call when her schedule is available.

## 2022-02-01 ENCOUNTER — Telehealth (HOSPITAL_BASED_OUTPATIENT_CLINIC_OR_DEPARTMENT_OTHER): Payer: Self-pay | Admitting: Family Medicine

## 2022-02-01 NOTE — Telephone Encounter (Signed)
Left message for patient to call back and schedule Medicare Annual Wellness Visit (AWV) in office.   If not able to come in office, please offer to do virtually or by telephone.  Left office number and my jabber [phone].  Last AWV:11/07/2020   Please schedule at anytime with Nurse Health Advisor.

## 2022-02-22 DIAGNOSIS — H04122 Dry eye syndrome of left lacrimal gland: Secondary | ICD-10-CM | POA: Diagnosis not present

## 2022-02-22 DIAGNOSIS — H04121 Dry eye syndrome of right lacrimal gland: Secondary | ICD-10-CM | POA: Diagnosis not present

## 2022-02-22 DIAGNOSIS — H5203 Hypermetropia, bilateral: Secondary | ICD-10-CM | POA: Diagnosis not present

## 2022-03-05 ENCOUNTER — Telehealth (HOSPITAL_BASED_OUTPATIENT_CLINIC_OR_DEPARTMENT_OTHER): Payer: Self-pay | Admitting: Family Medicine

## 2022-03-05 NOTE — Telephone Encounter (Signed)
Contacted [place] to schedule their annual wellness visit. Appointment made for 03/16/2022.  Thank you,  [place] Direct dial  [phone]

## 2022-03-06 ENCOUNTER — Other Ambulatory Visit (HOSPITAL_BASED_OUTPATIENT_CLINIC_OR_DEPARTMENT_OTHER): Payer: Self-pay

## 2022-03-06 ENCOUNTER — Other Ambulatory Visit: Payer: Self-pay

## 2022-03-08 ENCOUNTER — Other Ambulatory Visit (HOSPITAL_BASED_OUTPATIENT_CLINIC_OR_DEPARTMENT_OTHER): Payer: Self-pay

## 2022-03-08 ENCOUNTER — Telehealth (HOSPITAL_BASED_OUTPATIENT_CLINIC_OR_DEPARTMENT_OTHER): Payer: Self-pay | Admitting: *Deleted

## 2022-03-08 MED ORDER — RIVAROXABAN 20 MG PO TABS: 20.0000 mg | ORAL_TABLET | Freq: Every day | ORAL | 0 refills | Status: DC

## 2022-03-08 NOTE — Telephone Encounter (Signed)
Patient came in for samples of Xarelto 20 mg #2 given  Went to pharmacy to pick up today, cost out of pocket over $500  Spoke with patient and she has been getting her medications through [place] assistance program that will start back up in April

## 2022-03-16 ENCOUNTER — Other Ambulatory Visit: Payer: Self-pay

## 2022-03-16 ENCOUNTER — Other Ambulatory Visit (HOSPITAL_BASED_OUTPATIENT_CLINIC_OR_DEPARTMENT_OTHER): Payer: Self-pay

## 2022-03-16 ENCOUNTER — Encounter (HOSPITAL_BASED_OUTPATIENT_CLINIC_OR_DEPARTMENT_OTHER): Payer: Self-pay

## 2022-03-16 ENCOUNTER — Encounter (INDEPENDENT_AMBULATORY_CARE_PROVIDER_SITE_OTHER): Payer: Self-pay

## 2022-03-16 ENCOUNTER — Ambulatory Visit (HOSPITAL_BASED_OUTPATIENT_CLINIC_OR_DEPARTMENT_OTHER): Payer: Medicare Other

## 2022-03-16 VITALS — BP 120/74 | HR 68 | Temp 96.6°F | Ht 62.0 in | Wt 207.7 lb

## 2022-03-16 DIAGNOSIS — Z0000 Encounter for general adult medical examination without abnormal findings: Secondary | ICD-10-CM | POA: Diagnosis not present

## 2022-03-16 NOTE — Progress Notes (Signed)
Subjective:   Jennifer Mcclain is a 77 y.o. female who presents for Medicare Annual (Subsequent) preventive examination.  Review of Systems      Cardiac Risk Factors include: advanced age (>33mn, >>33women);hypertension     Objective:    Today's Vitals   03/16/22 1532  BP: 120/74  Pulse: 68  Temp: (!) 96.6 F (35.9 C)  TempSrc: Oral  SpO2: 98%  Weight: 207 lb 11.2 oz (94.2 kg)  Height: '5\' 2"'$  (1.575 m)   Body mass index is 37.99 kg/m.     03/16/2022    3:44 PM 10/09/2021    4:20 PM 11/07/2020    3:55 PM 10/13/2020    3:16 PM 09/26/2020    2:31 PM 07/29/2020    3:54 PM 03/24/2020    2:14 PM  Advanced Directives  Does Patient Have a Medical Advance Directive? Yes Yes Yes Yes Yes Yes Yes  Type of AParamedicof ASmithtonLiving will HOur TownLiving will HValley GroveLiving will HOffutt AFBLiving will HFrewsburgLiving will HKingslandLiving will HWeedvilleLiving will  Does patient want to make changes to medical advance directive? No - Patient declined  No - Patient declined  No - Patient declined No - Patient declined No - Patient declined  Copy of HBuckeystownin Chart? Yes - validated most recent copy scanned in chart (See row information)  Yes - validated most recent copy scanned in chart (See row information) Yes - validated most recent copy scanned in chart (See row information) Yes - validated most recent copy scanned in chart (See row information) Yes - validated most recent copy scanned in chart (See row information) Yes - validated most recent copy scanned in chart (See row information)    Current Medications (verified) Outpatient Encounter Medications as of 03/16/2022  Medication Sig   acetaminophen (TYLENOL) 650 MG CR tablet Take 650 mg by mouth in the morning, at noon, and at bedtime.   Calcium Carb-Cholecalciferol  (CALCIUM 600/VITAMIN D3 PO) Take 1 tablet by mouth 2 (two) times daily.   Cholecalciferol (VITAMIN D3) 25 MCG (1000 UT) CAPS Take 1,000 Units by mouth daily.    Loratadine (CLARITIN PO) Take by mouth daily.   metoprolol succinate (TOPROL-XL) 50 MG 24 hr tablet Take 2 tablets (100 mg total) by mouth in the morning AND 1 tablet (50 mg total) every evening.   Multiple Vitamins-Minerals (OCUVITE PO) Take 1 tablet by mouth daily.    polycarbophil (FIBERCON) 625 MG tablet Take 625 mg by mouth daily.   Probiotic Product (PHILLIPS COLON HEALTH PO) Take 1 capsule by mouth daily.    rivaroxaban (XARELTO) 20 MG TABS tablet TAKE 1 TABLET BY MOUTH EVERY DAY WITH SUPPER   rivaroxaban (XARELTO) 20 MG TABS tablet Take 1 tablet (20 mg total) by mouth daily with supper.   rosuvastatin (CRESTOR) 5 MG tablet Take 1 tablet (5 mg total) by mouth 4 (four) times a week.   sacubitril-valsartan (ENTRESTO) 49-51 MG Take 1 tablet by mouth 2 (two) times daily.   No facility-administered encounter medications on file as of 03/16/2022.    Allergies (verified) Latex, Adhesive [tape], Grass extracts [gramineae pollens], Lactose intolerance (gi), Naprosyn [naproxen], Coq10 [coenzyme q10], and Zocor [simvastatin]   History: Past Medical History:  Diagnosis Date   [place] tend[place]    [place]    Atrial fibrillation ([place]    Per records from  [facility] in [place], [place]    Baker's cyst of knee, left    with resulting lower extremity edema   Benign cyst of breast, left    Per records from [facility] in [place], [place]. Has yearly U/S in the past    Cerebrovascular disease    Per [facility] New Patient packet    Chronic atrial fibrillation ([place])    Per [place] Patient packet    Chronic atrial fibrillation (HCC)    [name], MD   Decreased hearing, bilateral    referred to audiologist, Per records from [facility] in [place], [place]    Disorder of bone density and  structure, unspecified    Per records from [facility] in [place], [place]    Disorder of carotid artery [facility])    [name], MD   Dyslipidemia    [name], MD   Elevated alkaline phosphatase level    Neg ative ANA 08/2013. Per records from [facility] in [place], [place]    H/O calculus of kidney during pregnancy    [name], MD   H/O hysterectomy for benign disease    [name], MD   History of fibula fracture    Right, Per records from [facility] in [place], [place]    History of torn meniscus of right knee    Per records from [facility] in [place], [place]    Humerus fracture    left leg- due to fall   Humerus fracture    Resulting from a fall, Per records from [facility] in [place], [place]    Hypercalcemia    Per records from [facility] in [place], [place]    Hypertension    Per [facility] New Patient packet    Kidney stone on left side    Per records from [facility] in [place], [place]    Lactose intolerance    Per [facility] New Patient packet    Multiple actinic keratoses    [name], MD   Myalgia due to statin    Per records from [facility] in [place], [place]. Due to crestor    Non-toxic multinodular goiter 01/21/2017   [name], MD   Normocytic anemia    [name], MD   Obstructive sleep apnea syndrome    Per records from [facility] in [place], [place]. Patient did not tolerate CPAP machine    Onychomycosis    Per records from [facility] in [place], [place]    Per [facility] New Patient packet    Osteopenia    Per records from [facility] in [place], [place]. Treated in the past with antiresorptive therapy   Renal cyst 10/11/2012   Simple on CT Scan 10/2012. 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   [name] , MD   Umbilical hernia    Per [facility] New Patient  packet    Vitamin D deficiency    [name], MD   Past Surgical History:  Procedure Laterality Date   ABDOMINAL HYSTERECTOMY  1989   One ovary, Per [place] New Patient packet    ABLATION  1996   Radical, Per [facility] New Patient packet    BREAST CYST ASPIRATION Bilateral    numerous, over several years per pt, always benign   COLONOSCOPY  2019   Dr.Melissa Jeneen Rinks, Per [facility] New Patient packet  KNEE ARTHROSCOPY W/ MENISCAL REPAIR  2002   Per [place] New Patient packet    MOUTH SURGERY     Dental Implant    REPLACEMENT TOTAL KNEE Left 2015   Per [facility] New Patient packet   RIGHT/LEFT HEART CATH AND CORONARY ANGIOGRAPHY N/A 02/07/2018   Procedure: RIGHT/LEFT HEART CATH AND CORONARY ANGIOGRAPHY;  Surgeon: Martinique, Peter M, MD;  Location: [place] [facility];  Service: Cardiovascular;  Laterality: N/A;   TONSILLECTOMY  1951   Per [place] New Patient packet    TOOTH EXTRACTION  01/12/2019   TOOTH EXTRACTION  06/05/2019   Preparing for dental implant    Family History  Problem Relation Age of Onset   Congestive Heart Failure Mother    Diabetes Mother 80   Arthritis Mother 16   Stroke Father    Dementia Father    Diabetes Father    Hyperlipidemia Father    Alzheimer's disease Father 62   Diabetes Sister    Cancer Maternal Grandfather    Diabetes Maternal Grandfather 63   CVA Maternal Grandfather    AAA (abdominal aortic aneurysm) Sister    Congenital heart disease Daughter    Heart attack Maternal Aunt    Lung cancer Maternal Grandmother 67   Breast cancer Paternal Aunt    Social History   Socioeconomic History   Marital status: Married    Spouse name: Not on file   Number of children: Not on file   Years of education: Not on file   Highest education level: Not on file  Occupational History   Not on file  Tobacco Use   Smoking status: Never   Smokeless tobacco: Never  Vaping Use   Vaping Use: Never used  Substance and Sexual Activity   Alcohol use: Yes    Comment: 2-3 a month -  social   Drug use: Never   Sexual activity: Not on file  Other Topics Concern   Not on file  Social History Narrative   Per [facility] New Patient Packet 12/06/17:      Diet: Low fat, dairy free      Caffeine: Yes      Married, if yes what year: Married, 1971      Do you live in a house, apartment, assisted living, condo, trailer, ect: Condo, 2 stories, 2 persons       Pets: No      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 No   Do you have difficulty preparing food or eating? No   Do you have difficulty managing your medications? No   Do you have difficulty managing your finances? No   Do you have difficulty affording your medications? No   Social Determinants of Health   Financial Resource Strain: Low Risk  (03/16/2022)   Overall Financial Resource Strain (CARDIA)    Difficulty of Paying Living Expenses: Not hard at all  Food Insecurity: No Food Insecurity (03/16/2022)   Hunger Vital Sign    Worried About Running Out of Food in the Last Year: Never true    Ran Out of Food in the Last Year: Never true  Transportation Needs: No Transportation Needs (03/16/2022)   PRAPARE -  Hydrologist (Medical): No    Lack of Transportation (Non-Medical): No  Physical Activity: Sufficiently Active (03/16/2022)   Exercise Vital Sign    Days of Exercise per Week: 3 days    Minutes of Exercise per Session: 150+ min  Stress: No Stress Concern Present (03/16/2022)   [place]    Feeling of Stress : Not at all  Social Connections: Moderately Integrated (03/16/2022)   Social Connection and Isolation Panel [NHANES]    Frequency of Communication with Friends and Family: More  than three times a week    Frequency of Social Gatherings with Friends and Family: More than three times a week    Attends Religious Services: Never    Marine scientist or Organizations: Yes    Attends Music therapist: More than 4 times per year    Marital Status: Married    Tobacco Counseling Counseling given: Not Answered   Clinical Intake:  Pre-visit preparation completed: Yes  Pain : No/denies pain     Nutritional Risks: None Diabetes: No  How often do you need to have someone help you when you read instructions, pamphlets, or other written materials from your doctor or pharmacy?: 1 - Never  Diabetic?  No  Interpreter Needed?: No  Information entered by :: Rolene Arbour LPN   Activities of Daily Living    03/16/2022    3:42 PM 03/12/2022   11:37 AM  In your present state of health, do you have any difficulty performing the following activities:  Hearing? 0 0  Vision? 0 0  Difficulty concentrating or making decisions? 0 0  Walking or climbing stairs? 1 1  Comment Orthopedic conditions   Dressing or bathing? 0 0  Doing errands, shopping? 0 0  Preparing Food and eating ? N N  Using the Toilet? N N  In the past six months, have you accidently leaked urine? N Y  Do you have problems with loss of bowel control? N Y  Managing your Medications? N N  Managing your Finances? N N  Housekeeping or managing your Housekeeping? N N    Patient Care Team: [place], Blondell Reveal, MD as PCP - General (Family Medicine) Buford Dresser, MD as PCP - Cardiology (Cardiology) Daleen Bo, PT as Physical Therapist (Physical Therapy) Melissa Noon, [place] as Referring Physician (Optometry)  Indicate any recent Medical Services you may have received from other than [facility] providers in the past year (date may be approximate).     Assessment:   This is a routine wellness examination for Sima.  Hearing/Vision screen Hearing Screening - Comments:: Denies hearing  difficulties   Vision Screening - Comments:: Wears rx glasses - up to date with routine eye exams with  [facility]  Dietary issues and exercise activities discussed: Exercise limited by: None identified;orthopedic condition(s)   Goals Addressed               This Visit's Progress     Increase physical activity (pt-stated)        Lose weight.       Depression Screen    03/16/2022    3:39 PM 11/07/2020    3:56 PM 03/24/2020    2:22 PM 03/19/2019    2:33 PM 03/19/2019    2:21 PM 08/16/2018   10:55 AM 02/15/2018    1:12 PM  PHQ 2/9 Scores  PHQ - 2 Score 0 0 0 0 0 0  0    Fall Risk    03/16/2022    3:43 PM 03/12/2022   11:37 AM 11/07/2020    3:55 PM 09/26/2020    2:36 PM 07/29/2020    3:54 PM  Fall Risk   Falls in the past year? '1 1 1 1 '$ 0  Number falls in past yr: 0 0 0 0 0  Injury with Fall? 0 0 1 1 0  Comment No medical attention required   Twisted left knee   Risk for fall due to : No Fall Risks  No Fall Risks History of fall(s) No Fall Risks  Follow up Falls prevention discussed  Falls evaluation completed Falls evaluation completed Falls evaluation completed    [place]:  Any stairs in or around the home? Yes  If so, are there any without handrails? No  Home free of loose throw rugs in walkways, pet beds, electrical cords, etc? Yes  Adequate lighting in your home to reduce risk of falls? Yes   ASSISTIVE DEVICES UTILIZED TO PREVENT FALLS:  Life alert? No  Use of a cane, walker or w/c? No  Grab bars in the bathroom? Yes  Shower chair or bench in shower? Yes  Elevated toilet seat or a handicapped toilet? Yes   TIMED UP AND GO:  Was the test performed? No . Audio Visit   Cognitive Function:    03/19/2019    2:33 PM 02/15/2018    1:16 PM  MMSE - Mini Mental State Exam  Orientation to time 5 5  Orientation to Place 5 5  Registration 3 3  Attention/ Calculation 5 5  Recall 3 3  Language- name 2 objects 2 2  Language- repeat  1 1  Language- follow 3 step command 3 1  Language- read & follow direction 1 1  Write a sentence 1   Copy design 1 1  Total score 30         11/07/2020    3:56 PM  6CIT Screen  What Year? 0 points  What month? 0 points  What time? 0 points  Count back from 20 0 points  Months in reverse 0 points  Repeat phrase 0 points  Total Score 0 points    Immunizations Immunization History  Administered Date(s) Administered   Fluad Quad(high Dose 65+) 09/14/2018, 09/24/2019, 09/26/2020   Influenza, High Dose Seasonal PF 10/31/2017   Influenza, Quadrivalent, Recombinant, Inj, Pf 10/11/2016   Influenza,inj,quad, With Preservative 10/11/2016   PFIZER(Purple Top)SARS-COV-2 Vaccination 01/30/2019, 02/19/2019, 11/06/2019   Pneumococcal Conjugate-13 11/20/2013   Pneumococcal Polysaccharide-23 11/13/2012   Tdap 11/27/2007, 07/05/2018   Zoster Recombinat (Shingrix) 05/08/2016, 08/14/2016    TDAP status: Up to date  Flu Vaccine status: Up to date  Pneumococcal vaccine status: Up to date  Covid-19 vaccine status: Completed vaccines  Qualifies for Shingles Vaccine? Yes   Zostavax completed Yes   Shingrix Completed?: Yes  Screening Tests Health Maintenance  Topic Date Due   COVID-19 Vaccine (4 - 2023-24 season) 04/01/2022 (Originally 09/11/2021)   Medicare Annual Wellness (AWV)  03/16/2023   Fecal DNA (Cologuard)  04/02/2023   MAMMOGRAM  10/01/2023   DTaP/Tdap/Td (3 - Td or Tdap) 07/04/2028   Pneumonia Vaccine 35+ Years old  Completed   INFLUENZA VACCINE  Completed   DEXA SCAN  Completed   Hepatitis C Screening  Completed   Zoster Vaccines- Shingrix  Completed   HPV VACCINES  Aged Out   COLONOSCOPY (Pts 45-27yr Insurance coverage will  need to be confirmed)  Discontinued    Health Maintenance  There are no preventive care reminders to display for this patient.   Colorectal cancer screening: Type of screening: Cologuard. Completed 3/2//22. Repeat every 3 years  Mammogram  status: Completed 09/30/21. Repeat every year  Bone Density status: Completed 03/30/19. Results reflect: Bone density results: OSTEOPOROSIS. Repeat every   years.  Lung Cancer Screening: (Low Dose CT Chest recommended if Age 28-80 years, 30 pack-year currently smoking OR have quit w/in 15years.) does not qualify.     Additional Screening:  Hepatitis C Screening: does qualify; Completed 08/22/18  Vision Screening: Recommended annual ophthalmology exams for early detection of glaucoma and other disorders of the eye. Is the patient up to date with their annual eye exam?  Yes  Who is the provider or what is the name of the office in which the patient attends annual eye exams? [facility] If pt is not established with a provider, would they like to be referred to a provider to establish care? No .   Dental Screening: Recommended annual dental exams for proper oral hygiene  Community Resource Referral / Chronic Care Management:  CRR required this visit?  No   CCM required this visit?  No      Plan:     I have personally reviewed and noted the following in the patient's chart:   Medical and social history Use of alcohol, tobacco or illicit drugs  Current medications and supplements including opioid prescriptions. Patient is not currently taking opioid prescriptions. Functional ability and status Nutritional status Physical activity Advanced directives List of other physicians Hospitalizations, surgeries, and ER visits in previous 12 months Vitals Screenings to include cognitive, depression, and falls Referrals and appointments  In addition, I have reviewed and discussed with patient certain preventive protocols, quality metrics, and best practice recommendations. A written personalized care plan for preventive services as well as general preventive health recommendations were provided to patient.     Criselda Peaches, LPN   [id number]   Nurse Notes: None

## 2022-03-16 NOTE — Patient Instructions (Addendum)
Jennifer Mcclain , Thank you for taking time to come for your Medicare Wellness Visit. I appreciate your ongoing commitment to your health goals. Please review the following plan we discussed and let me know if I can assist you in the future.   These are the goals we discussed:  Goals       Exercise 172mn/wk light-Moderate Activity      Plans to start exercise (plans to discuss with cardiologist)      Increase physical activity (pt-stated)      Lose weight.      Weight (lb) < 190 lb (86.2 kg)      Starting 03/19/19 would like to lose 2 lbs a month        This is a list of the screening recommended for you and due dates:  Health Maintenance  Topic Date Due   COVID-19 Vaccine (4 - 2023-24 season) 04/01/2022*   Medicare Annual Wellness Visit  03/16/2023   Cologuard (Stool DNA test)  04/02/2023   Mammogram  10/01/2023   DTaP/Tdap/Td vaccine (3 - Td or Tdap) 07/04/2028   Pneumonia Vaccine  Completed   Flu Shot  Completed   DEXA scan (bone density measurement)  Completed   Hepatitis C Screening: USPSTF Recommendation to screen - Ages 169-79yo.  Completed   Zoster (Shingles) Vaccine  Completed   HPV Vaccine  Aged Out   Colon Cancer Screening  Discontinued  *Topic was postponed. The date shown is not the original due date.    Advanced directives: .In chart   Conditions/risks identified: None  Next appointment: Follow up in one year for your annual wellness visit    Preventive Care 65 Years and Older, Female Preventive care refers to lifestyle choices and visits with your health care provider that can promote health and wellness. What does preventive care include? A yearly physical exam. This is also called an annual well check. Dental exams once or twice a year. Routine eye exams. Ask your health care provider how often you should have your eyes checked. Personal lifestyle choices, including: Daily care of your teeth and gums. Regular physical activity. Eating a healthy  diet. Avoiding tobacco and drug use. Limiting alcohol use. Practicing safe sex. Taking low-dose aspirin every day. Taking vitamin and mineral supplements as recommended by your health care provider. What happens during an annual well check? The services and screenings done by your health care provider during your annual well check will depend on your age, overall health, lifestyle risk factors, and family history of disease. Counseling  Your health care provider may ask you questions about your: Alcohol use. Tobacco use. Drug use. Emotional well-being. Home and relationship well-being. Sexual activity. Eating habits. History of falls. Memory and ability to understand (cognition). Work and work [employer] Reproductive health. Screening  You may have the following tests or measurements: Height, weight, and BMI. Blood pressure. Lipid and cholesterol levels. These may be checked every 5 years, or more frequently if you are over 55years old. Skin check. Lung cancer screening. You may have this screening every year starting at age 33921if you have a 30-pack-year history of smoking and currently smoke or have quit within the past 15 years. Fecal occult blood test (FOBT) of the stool. You may have this test every year starting at age 33989 Flexible sigmoidoscopy or colonoscopy. You may have a sigmoidoscopy every 5 years or a colonoscopy every 10 years starting at age 33952 Hepatitis C blood test. Hepatitis B blood test. Sexually  transmitted disease (STD) testing. Diabetes screening. This is done by checking your blood sugar (glucose) after you have not eaten for a while (fasting). You may have this done every 1-3 years. Bone density scan. This is done to screen for osteoporosis. You may have this done starting at age 52. Mammogram. This may be done every 1-2 years. Talk to your health care provider about how often you should have regular mammograms. Talk with your health care provider about  your test results, treatment options, and if necessary, the need for more tests. Vaccines  Your health care provider may recommend certain vaccines, such as: Influenza vaccine. This is recommended every year. Tetanus, diphtheria, and acellular pertussis (Tdap, Td) vaccine. You may need a Td booster every 10 years. Zoster vaccine. You may need this after age 51. Pneumococcal 13-valent conjugate (PCV13) vaccine. One dose is recommended after age 38. Pneumococcal polysaccharide (PPSV23) vaccine. One dose is recommended after age 6. Talk to your health care provider about which screenings and vaccines you need and how often you need them. This information is not intended to replace advice given to you by your health care provider. Make sure you discuss any questions you have with your health care provider. Document Released: 01/24/2015 Document Revised: 09/17/2015 Document Reviewed: 10/29/2014 Elsevier Interactive Patient Education  2017 [place] Prevention in the Home Falls can cause injuries. They can happen to people of all ages. There are many things you can do to make your home safe and to help prevent falls. What can I do on the outside of my home? Regularly fix the edges of walkways and driveways and fix any cracks. Remove anything that might make you trip as you walk through a door, such as a raised step or threshold. Trim any bushes or trees on the path to your home. Use bright outdoor lighting. Clear any walking paths of anything that might make someone trip, such as rocks or tools. Regularly check to see if handrails are loose or broken. Make sure that both sides of any steps have handrails. Any raised decks and porches should have guardrails on the edges. Have any leaves, snow, or ice cleared regularly. Use sand or salt on walking paths during winter. Clean up any spills in your garage right away. This includes oil or grease spills. What can I do in the bathroom? Use  night lights. Install grab bars by the toilet and in the tub and shower. Do not use towel bars as grab bars. Use non-skid mats or decals in the tub or shower. If you need to sit down in the shower, use a plastic, non-slip stool. Keep the floor dry. Clean up any water that spills on the floor as soon as it happens. Remove soap buildup in the tub or shower regularly. Attach bath mats securely with double-sided non-slip rug tape. Do not have throw rugs and other things on the floor that can make you trip. What can I do in the bedroom? Use night lights. Make sure that you have a light by your bed that is easy to reach. Do not use any sheets or blankets that are too big for your bed. They should not hang down onto the floor. Have a firm chair that has side arms. You can use this for support while you get dressed. Do not have throw rugs and other things on the floor that can make you trip. What can I do in the kitchen? Clean up any spills right away. Avoid  walking on wet floors. Keep items that you use a lot in easy-to-reach places. If you need to reach something above you, use a strong step stool that has a grab bar. Keep electrical cords out of the way. Do not use floor polish or wax that makes floors slippery. If you must use wax, use non-skid floor wax. Do not have throw rugs and other things on the floor that can make you trip. What can I do with my stairs? Do not leave any items on the stairs. Make sure that there are handrails on both sides of the stairs and use them. Fix handrails that are broken or loose. Make sure that handrails are as long as the stairways. Check any carpeting to make sure that it is firmly attached to the stairs. Fix any carpet that is loose or worn. Avoid having throw rugs at the top or bottom of the stairs. If you do have throw rugs, attach them to the floor with carpet tape. Make sure that you have a light switch at the top of the stairs and the bottom of the  stairs. If you do not have them, ask someone to add them for you. What else can I do to help prevent falls? Wear shoes that: Do not have high heels. Have rubber bottoms. Are comfortable and fit you well. Are closed at the toe. Do not wear sandals. If you use a stepladder: Make sure that it is fully opened. Do not climb a closed stepladder. Make sure that both sides of the stepladder are locked into place. Ask someone to hold it for you, if possible. Clearly mark and make sure that you can see: Any grab bars or handrails. First and last steps. Where the edge of each step is. Use tools that help you move around (mobility aids) if they are needed. These include: Canes. Walkers. Scooters. Crutches. Turn on the lights when you go into a dark area. Replace any light bulbs as soon as they burn out. Set up your furniture so you have a clear path. Avoid moving your furniture around. If any of your floors are uneven, fix them. If there are any pets around you, be aware of where they are. Review your medicines with your doctor. Some medicines can make you feel dizzy. This can increase your chance of falling. Ask your doctor what other things that you can do to help prevent falls. This information is not intended to replace advice given to you by your health care provider. Make sure you discuss any questions you have with your health care provider. Document Released: 10/24/2008 Document Revised: 06/05/2015 Document Reviewed: 02/01/2014 Elsevier Interactive Patient Education  2017 [employer].

## 2022-03-18 ENCOUNTER — Other Ambulatory Visit (HOSPITAL_BASED_OUTPATIENT_CLINIC_OR_DEPARTMENT_OTHER): Payer: Self-pay

## 2022-03-24 ENCOUNTER — Ambulatory Visit (INDEPENDENT_AMBULATORY_CARE_PROVIDER_SITE_OTHER): Payer: Medicare Other | Admitting: Family Medicine

## 2022-03-24 ENCOUNTER — Encounter (HOSPITAL_BASED_OUTPATIENT_CLINIC_OR_DEPARTMENT_OTHER): Payer: Self-pay | Admitting: Family Medicine

## 2022-03-24 VITALS — BP 138/90 | HR 99 | Ht 62.0 in | Wt 205.0 lb

## 2022-03-24 DIAGNOSIS — M549 Dorsalgia, unspecified: Secondary | ICD-10-CM | POA: Insufficient documentation

## 2022-03-24 DIAGNOSIS — G8929 Other chronic pain: Secondary | ICD-10-CM | POA: Diagnosis not present

## 2022-03-24 DIAGNOSIS — D492 Neoplasm of unspecified behavior of bone, soft tissue, and skin: Secondary | ICD-10-CM | POA: Insufficient documentation

## 2022-03-24 DIAGNOSIS — I10 Essential (primary) hypertension: Secondary | ICD-10-CM | POA: Diagnosis not present

## 2022-03-24 DIAGNOSIS — Z0000 Encounter for general adult medical examination without abnormal findings: Secondary | ICD-10-CM | POA: Diagnosis not present

## 2022-03-24 DIAGNOSIS — M545 Low back pain, unspecified: Secondary | ICD-10-CM | POA: Diagnosis not present

## 2022-03-24 HISTORY — DX: Low back pain, unspecified: M54.50

## 2022-03-24 NOTE — Assessment & Plan Note (Signed)
Followed by cardiology 

## 2022-03-24 NOTE — Assessment & Plan Note (Signed)
Skin growth present on right shoulder. At this time, small pale mole is present. It is symmetrical with a border and is not evolving. Patient would like to be referred to dermatology to have skin cancer screening performed and have this mole assessed.

## 2022-03-24 NOTE — Assessment & Plan Note (Signed)
Routine labs reviewed. HCM reviewed/discussed. Patient up-to-date on all preventative screenings.  Anticipatory guidance regarding healthy weight, lifestyle and choices given. Recommend healthy diet.  Recommend approximately 150 minutes/week of moderate intensity exercise. Limit alcohol consumption: no more than one drink per day for women and 2 drinks per day for me. Recommend regular dental and vision exams. Always use seatbelt/lap and shoulder restraints. Recommend using smoke alarms and checking batteries at least twice a year. Recommend using sunscreen when outside. Plans to see dermatology for skin assessment. Referral placed.

## 2022-03-24 NOTE — Progress Notes (Signed)
Complete physical exam  Patient: Jennifer Mcclain   DOB: 11-05-1945   77 y.o. Female  MRN: [id number]  Subjective:     Jennifer Mcclain is a 77 y.o. female who presents today for a complete physical exam. She reports consuming a low sodium diet. Exercise is limited by cardiac condition(s): atrial fibrillation, orthopedic condition(s): chronic Achilles tendinitis. She generally feels well. She reports sleeping well. She does have additional problems to discuss today.   She reports having an unchanging mole on her R shoulder. Will assess today.  Denies itching, bleeding, cracking, and change in shape/color/diameter.   Chronic achilles tenditinits- PT started last year  3x per week does water therapy at [facility] of pain- lower back that is aching. Sometimes on left or right. Occurs randomly but does not persist.  Denies tingling, numbness, bowel or bladder incontinence, recent infection. Reports that she did fall this past year but she did not hurt her back at the time. Her knee was injured.     Most recent fall risk assessment:    03/16/2022    3:43 PM  [place] in the past year? 1  Number falls in past yr: 0  Injury with Fall? 0  Comment No medical attention required  Risk for fall due to : No Fall Risks  Follow up Falls prevention discussed     Most recent depression screenings:    03/16/2022    3:39 PM 11/07/2020    3:56 PM  PHQ 2/9 Scores  PHQ - 2 Score 0 0   Feb 2024- f/u for cataract surgery  Saw dentist recently    Patient Care Team: Les Pou, [place] as PCP - General (Family Medicine) Buford Dresser, MD as PCP - Cardiology (Cardiology) Daleen Bo, PT as Physical Therapist (Physical Therapy) Melissa Noon, OD as Referring Physician (Optometry)   Outpatient Medications Prior to Visit  Medication Sig   acetaminophen (TYLENOL) 650 MG CR tablet Take 650 mg by mouth in the morning, at noon, and at bedtime.   Cholecalciferol (VITAMIN  D3) 25 MCG (1000 UT) CAPS Take 1,000 Units by mouth daily.    Loratadine (CLARITIN PO) Take by mouth daily.   metoprolol succinate (TOPROL-XL) 50 MG 24 hr tablet Take 2 tablets (100 mg total) by mouth in the morning AND 1 tablet (50 mg total) every evening.   Multiple Vitamins-Minerals (OCUVITE PO) Take 1 tablet by mouth daily.    polycarbophil (FIBERCON) 625 MG tablet Take 625 mg by mouth daily.   Probiotic Product (PHILLIPS COLON HEALTH PO) Take 1 capsule by mouth daily.    rivaroxaban (XARELTO) 20 MG TABS tablet Take 1 tablet (20 mg total) by mouth daily with supper.   rosuvastatin (CRESTOR) 5 MG tablet Take 1 tablet (5 mg total) by mouth 4 (four) times a week.   sacubitril-valsartan (ENTRESTO) 49-51 MG Take 1 tablet by mouth 2 (two) times daily.   Calcium Carb-Cholecalciferol (CALCIUM 600/VITAMIN D3 PO) Take 1 tablet by mouth 2 (two) times daily. (Patient not taking: Reported on 03/24/2022)   [DISCONTINUED] rivaroxaban (XARELTO) 20 MG TABS tablet TAKE 1 TABLET BY MOUTH EVERY DAY WITH SUPPER (Patient not taking: Reported on 03/24/2022)   No facility-administered medications prior to visit.    Review of Systems  Constitutional:  Negative for chills, fever and weight loss.  Eyes:  Negative for blurred vision and double vision.  Respiratory:  Negative for cough.   Cardiovascular:  Negative for chest pain and palpitations.  Gastrointestinal:  Negative for nausea and vomiting.  Skin:  Negative for rash.  Neurological:  Negative for dizziness, weakness and headaches.  Psychiatric/Behavioral:  Negative for depression and suicidal ideas.        Objective:     BP (!) 138/90   Pulse 99   Ht '5\' 2"'$  (1.575 m)   Wt 205 lb (93 kg)   SpO2 95%   BMI 37.49 kg/m  BP Readings from Last 3 Encounters:  03/24/22 (!) 138/90  03/16/22 120/74  11/26/21 134/78      Physical Exam Constitutional:      Appearance: Normal appearance.  HENT:     Head: Normocephalic.     Right Ear: Tympanic  membrane, ear canal and external ear normal.     Left Ear: Tympanic membrane, ear canal and external ear normal.     Nose: Nose normal.     Mouth/Throat:     Mouth: Mucous membranes are moist.     Pharynx: Oropharynx is clear.  Eyes:     Extraocular Movements: Extraocular movements intact.     Conjunctiva/sclera: Conjunctivae normal.     Pupils: Pupils are equal, round, and reactive to light.  Cardiovascular:     Rate and Rhythm: Normal rate. Rhythm irregular.  Pulmonary:     Effort: Pulmonary effort is normal.     Breath sounds: Normal breath sounds.  Abdominal:     General: Bowel sounds are normal.     Palpations: Abdomen is soft.  Musculoskeletal:        General: Normal range of motion.     Cervical back: Normal range of motion.     Right lower leg: Edema (non-pitting) present.     Left lower leg: Edema (non-pitting) present.  Skin:    General: Skin is warm and dry.     Comments: Pale, round, symmetrical mole with defined borders present on R shoulder    Neurological:     Mental Status: She is alert.  Psychiatric:        Mood and Affect: Mood normal.        Behavior: Behavior normal.        Thought Content: Thought content normal.        Judgment: Judgment normal.         Assessment & Plan:    Routine Health Maintenance and Physical Exam  Immunization History  Administered Date(s) Administered   Fluad Quad(high Dose 65+) 09/14/2018, 09/24/2019, 09/26/2020, 10/21/2021   Influenza, High Dose Seasonal PF 10/31/2017   Influenza, Quadrivalent, Recombinant, Inj, Pf 10/11/2016   Influenza,inj,quad, With Preservative 10/11/2016   PFIZER(Purple Top)SARS-COV-2 Vaccination 01/30/2019, 02/19/2019, 11/06/2019   Pneumococcal Conjugate-13 11/20/2013   Pneumococcal Polysaccharide-23 11/13/2012   Tdap 11/27/2007, 07/05/2018   Zoster Recombinat (Shingrix) 05/08/2016, 08/14/2016    Health Maintenance  Topic Date Due   COVID-19 Vaccine (4 - 2023-24 season) 04/01/2022  (Originally 09/11/2021)   Medicare Annual Wellness (AWV)  03/16/2023   Fecal DNA (Cologuard)  04/02/2023   MAMMOGRAM  10/01/2023   DTaP/Tdap/Td (3 - Td or Tdap) 07/04/2028   Pneumonia Vaccine 9+ Years old  Completed   INFLUENZA VACCINE  Completed   DEXA SCAN  Completed   Hepatitis C Screening  Completed   Zoster Vaccines- Shingrix  Completed   HPV VACCINES  Aged Out   COLONOSCOPY (Pts 45-56yr Insurance coverage will need to be confirmed)  Discontinued    Discussed health benefits of physical activity, and encouraged her to engage in regular exercise appropriate for her  age and condition.  Problem List Items Addressed This Visit       Cardiovascular and Mediastinum   Primary hypertension    Followed by cardiology.       Relevant Orders   CBC with Differential/Platelet     Musculoskeletal and Integument   Skin growth    Skin growth present on right shoulder. At this time, small pale mole is present. It is symmetrical with a border and is not evolving. Patient would like to be referred to dermatology to have skin cancer screening performed and have this mole assessed.       Relevant Orders   Ambulatory referral to Dermatology     Other   Wellness examination - Primary    Routine labs reviewed. HCM reviewed/discussed. Patient up-to-date on all preventative screenings.  Anticipatory guidance regarding healthy weight, lifestyle and choices given. Recommend healthy diet.  Recommend approximately 150 minutes/week of moderate intensity exercise. Limit alcohol consumption: no more than one drink per day for women and 2 drinks per day for me. Recommend regular dental and vision exams. Always use seatbelt/lap and shoulder restraints. Recommend using smoke alarms and checking batteries at least twice a year. Recommend using sunscreen when outside. Plans to see dermatology for skin assessment. Referral placed.       Relevant Orders   CBC with Differential/Platelet   Comprehensive  metabolic panel   Lipid panel   TSH Rfx on Abnormal to Free T4   Low back pain    Patient reports lower aching back pain that sometimes occurs on the right or left side. It is not persistent or self-limiting. Denies bowel or bladder involvement, saddle paresthesia, recent infections or fever, prolonged steroid use, recent trauma, radiating numbness/tingling/pain. Currently working with PT at [facility]. Will schedule follow-up in 6 months to determine if lower back pain is improving with PT.         Return in about 6 months (around 09/24/2022) for back pain.     Les Pou, FNP

## 2022-03-24 NOTE — Assessment & Plan Note (Addendum)
Patient reports lower aching back pain that sometimes occurs on the right or left side. It is not persistent or self-limiting. Denies bowel or bladder involvement, saddle paresthesia, recent infections or fever, prolonged steroid use, recent trauma, radiating numbness/tingling/pain. Currently working with PT at [facility]. Will schedule follow-up in 6 months to determine if lower back pain is improving with PT.

## 2022-03-25 ENCOUNTER — Other Ambulatory Visit (HOSPITAL_BASED_OUTPATIENT_CLINIC_OR_DEPARTMENT_OTHER): Payer: Self-pay

## 2022-03-25 DIAGNOSIS — R7989 Other specified abnormal findings of blood chemistry: Secondary | ICD-10-CM

## 2022-03-25 LAB — COMPREHENSIVE METABOLIC PANEL
ALT: 6 IU/L (ref 0–32)
AST: 17 IU/L (ref 0–40)
Albumin/Globulin Ratio: 1.6 (ref 1.2–2.2)
Albumin: 4.2 g/dL (ref 3.8–4.8)
Alkaline Phosphatase: 75 IU/L (ref 44–121)
BUN/Creatinine Ratio: 15 (ref 12–28)
BUN: 17 mg/dL (ref 8–27)
Bilirubin Total: 0.5 mg/dL (ref 0.0–1.2)
CO2: 26 mmol/L (ref 20–29)
Calcium: 9.7 mg/dL (ref 8.7–10.3)
Chloride: 100 mmol/L (ref 96–106)
Creatinine, Ser: 1.16 mg/dL — ABNORMAL HIGH (ref 0.57–1.00)
Globulin, Total: 2.6 g/dL (ref 1.5–4.5)
Glucose: 93 mg/dL (ref 70–99)
Potassium: 4.9 mmol/L (ref 3.5–5.2)
Sodium: 139 mmol/L (ref 134–144)
Total Protein: 6.8 g/dL (ref 6.0–8.5)
eGFR: 49 mL/min/{1.73_m2} — ABNORMAL LOW (ref >59)

## 2022-03-25 LAB — CBC WITH DIFFERENTIAL/PLATELET
Basophils Absolute: 0 10*3/uL (ref 0.0–0.2)
Basos: 1 %
EOS (ABSOLUTE): 0.1 10*3/uL (ref 0.0–0.4)
Eos: 1 %
Hematocrit: 40.3 % (ref 34.0–46.6)
Hemoglobin: 13.3 g/dL (ref 11.1–15.9)
Immature Grans (Abs): 0 10*3/uL (ref 0.0–0.1)
Immature Granulocytes: 0 %
Lymphocytes Absolute: 1.4 10*3/uL (ref 0.7–3.1)
Lymphs: 28 %
MCH: 32.4 pg (ref 26.6–33.0)
MCHC: 33 g/dL (ref 31.5–35.7)
MCV: 98 fL — ABNORMAL HIGH (ref 79–97)
Monocytes Absolute: 0.5 10*3/uL (ref 0.1–0.9)
Monocytes: 11 %
Neutrophils Absolute: 3 10*3/uL (ref 1.4–7.0)
Neutrophils: 59 %
Platelets: 175 10*3/uL (ref 150–450)
RBC: 4.11 x10E6/uL (ref 3.77–5.28)
RDW: 12.7 % (ref 11.7–15.4)
WBC: 5 10*3/uL (ref 3.4–10.8)

## 2022-03-25 LAB — LIPID PANEL
Chol/HDL Ratio: 1.9 ratio (ref 0.0–4.4)
Cholesterol, Total: 200 mg/dL — ABNORMAL HIGH (ref 100–199)
HDL: 104 mg/dL (ref >39)
LDL Chol Calc (NIH): 84 mg/dL (ref 0–99)
Triglycerides: 69 mg/dL (ref 0–149)
VLDL Cholesterol Cal: 12 mg/dL (ref 5–40)

## 2022-03-25 LAB — TSH RFX ON ABNORMAL TO FREE T4: TSH: 1.77 u[IU]/mL (ref 0.450–4.500)

## 2022-03-25 NOTE — Addendum Note (Signed)
Addended by: Les Pou on: 03/25/2022 01:25 PM   Modules accepted: Level of Service

## 2022-03-26 ENCOUNTER — Encounter (HOSPITAL_BASED_OUTPATIENT_CLINIC_OR_DEPARTMENT_OTHER): Payer: Self-pay | Admitting: Family Medicine

## 2022-04-15 ENCOUNTER — Encounter (HOSPITAL_BASED_OUTPATIENT_CLINIC_OR_DEPARTMENT_OTHER): Payer: Self-pay

## 2022-04-15 ENCOUNTER — Other Ambulatory Visit (HOSPITAL_BASED_OUTPATIENT_CLINIC_OR_DEPARTMENT_OTHER): Payer: Self-pay

## 2022-04-27 ENCOUNTER — Other Ambulatory Visit (HOSPITAL_BASED_OUTPATIENT_CLINIC_OR_DEPARTMENT_OTHER): Payer: Self-pay

## 2022-04-27 ENCOUNTER — Other Ambulatory Visit: Payer: Self-pay

## 2022-04-27 DIAGNOSIS — I482 Chronic atrial fibrillation, unspecified: Secondary | ICD-10-CM

## 2022-04-27 MED ORDER — RIVAROXABAN 20 MG PO TABS
20.0000 mg | ORAL_TABLET | Freq: Every day | ORAL | 1 refills | Status: DC
  Filled 2022-04-27: qty 90, 90d supply, fill #0
  Filled 2022-04-29: qty 30, 30d supply, fill #0
  Filled 2022-05-24: qty 30, 30d supply, fill #1
  Filled 2022-06-23: qty 30, 30d supply, fill #2
  Filled 2022-07-21 – 2022-07-22 (×4): qty 30, 30d supply, fill #3
  Filled 2022-08-20: qty 30, 30d supply, fill #4
  Filled 2022-09-18 (×2): qty 30, 30d supply, fill #5

## 2022-04-27 NOTE — Telephone Encounter (Signed)
Prescription refill request for Xarelto received.  Indication: Afib  Last office visit: 11/26/21 [name])  Weight: 93kg Age: 77 Scr: 1.16 (03/24/22)  CrCl: 62.53ml/min Appropriate dose. Refill sent.

## 2022-04-29 ENCOUNTER — Other Ambulatory Visit (HOSPITAL_BASED_OUTPATIENT_CLINIC_OR_DEPARTMENT_OTHER): Payer: Self-pay

## 2022-05-13 ENCOUNTER — Other Ambulatory Visit (HOSPITAL_BASED_OUTPATIENT_CLINIC_OR_DEPARTMENT_OTHER): Payer: Self-pay

## 2022-05-24 ENCOUNTER — Other Ambulatory Visit (HOSPITAL_BASED_OUTPATIENT_CLINIC_OR_DEPARTMENT_OTHER): Payer: Self-pay

## 2022-05-27 ENCOUNTER — Other Ambulatory Visit: Payer: Self-pay

## 2022-05-27 ENCOUNTER — Other Ambulatory Visit (HOSPITAL_BASED_OUTPATIENT_CLINIC_OR_DEPARTMENT_OTHER): Payer: Self-pay

## 2022-06-01 ENCOUNTER — Other Ambulatory Visit (HOSPITAL_BASED_OUTPATIENT_CLINIC_OR_DEPARTMENT_OTHER): Payer: Self-pay

## 2022-06-01 ENCOUNTER — Encounter (HOSPITAL_BASED_OUTPATIENT_CLINIC_OR_DEPARTMENT_OTHER): Payer: Self-pay | Admitting: Cardiology

## 2022-06-01 ENCOUNTER — Other Ambulatory Visit: Payer: Self-pay

## 2022-06-01 ENCOUNTER — Ambulatory Visit (INDEPENDENT_AMBULATORY_CARE_PROVIDER_SITE_OTHER): Payer: Medicare Other | Admitting: Cardiology

## 2022-06-01 VITALS — BP 124/82 | HR 82 | Ht 62.0 in | Wt 204.4 lb

## 2022-06-01 DIAGNOSIS — R0609 Other forms of dyspnea: Secondary | ICD-10-CM

## 2022-06-01 DIAGNOSIS — Z7901 Long term (current) use of anticoagulants: Secondary | ICD-10-CM | POA: Diagnosis not present

## 2022-06-01 DIAGNOSIS — E7800 Pure hypercholesterolemia, unspecified: Secondary | ICD-10-CM

## 2022-06-01 DIAGNOSIS — I4821 Permanent atrial fibrillation: Secondary | ICD-10-CM | POA: Diagnosis not present

## 2022-06-01 DIAGNOSIS — I428 Other cardiomyopathies: Secondary | ICD-10-CM

## 2022-06-01 DIAGNOSIS — D6869 Other thrombophilia: Secondary | ICD-10-CM

## 2022-06-01 DIAGNOSIS — I10 Essential (primary) hypertension: Secondary | ICD-10-CM | POA: Diagnosis not present

## 2022-06-01 NOTE — Progress Notes (Signed)
Cardiology Office Note:    Date:  06/01/2022   ID:  Jennifer Mcclain, DOB 14-Sep-1945, MRN [id number]  PCP:  Alyson Reedy, FNP  Cardiologist:  [name], MD PhD  Referring MD: de [place], Buren Kos, MD   CC: follow up  History of Present Illness:    Jennifer Mcclain is a 77 y.o. female with a hx of permanent atrial fibrillation on DOAC, nonischemic cardiomyopathy, hyperlipidemia, hypertension who is seen for follow up. Initial consult with me was on 01/17/18 for atrial fibrillation.  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 Had an ablation done, but was told part of it was too close to the His bundle and so the EP cardiologist ablated all around it but not in the bundle. Moved to [place] about 10 years ago, was told there was no evidence of WPW. Daughter passed away from heart disease; had a kink in one of her arteries, died at age 82. Since coming to [facility], had echo with abnormal EF, moderate MR, moderate-severe TR. Cath did not show significant CAD. Monitor x2 showed 100% afib, now permanent based on patient preference for rate control only.  She had returned to exercising in the pool after recovering from her cataract surgery. Her home blood pressures were well controlled. Referred to [facility] for GFR 50. Believes remotely she was told she may have a kidney cyst. No known proteinuria.  She presented to the ED 10/09/21 after testing positive for COVID at home. She had initially developed URI symptoms on 10/03/21, but tested negative for COVID at that time. It was suspected she truly had COVID for those 7 days. Clinically she did not appear to have acute CHF. She was instructed in supportive care and return precautions.  At her last visit she complained of ongoing double vision after her cataract surgery. She noted some LE edema, R>L. She did well with water aerobic exercise but complained of short of breath with frequent walking. She was compliant with  her Rosuvastatin 4 times a week as prescribed. Blood pressures were at goal.  Today, she is feeling okay aside from some intermittent pain in her left ankle. She also has aching pains from her Baker's cysts that will wake her up maybe once every few months. Typically she will take 1-2 tablets of ibuprofen before bed, no more than 4 tablets.  For exercise she continues with water aerobics and generally feels well during the exercise. She will still feel short of breath if she were to walk quickly. Since the last appointment she reports a successful weight loss of a few pounds.  She remains compliant with Xarelto. Generally she doesn't have significant bleeding issues, although she does complain of occasional epistaxis that stops eventually; last week this was mild. She does take an OTC allergy medication.  She denies any palpitations, chest pain, or peripheral edema. No lightheadedness, headaches, syncope, orthopnea, or PND.   Past Medical History:  Diagnosis Date   Achilles tendinitis    Per [facility] New Patient packet    Atrial fibrillation [facility])    Per records from [facility] in [place], [place]    Baker's cyst of knee, left    with resulting lower extremity edema   Benign cyst of breast, left    Per records from [facility] in [place], [place]. 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 Per records from [facility] in [place], [place]    H/O calculus of kidney during pregnancy    Lynden Ang, MD   H/O hysterectomy for benign disease    Lynden Ang, MD   History of fibula fracture    Right, Per records from [facility] in [place], [place]    History of torn meniscus of right knee    Per records from [facility] in [place], [place]    Humerus fracture    left leg- due to fall   Humerus fracture    Resulting from a fall, Per records from [facility] in [place], [place]    Hypercalcemia    Per records from [facility] in [place], [place]    Hypertension    Per [facility] New Patient packet    Kidney stone on left side    Per records from [facility] in [place], [place]    Lactose intolerance    Per [facility] New Patient packet    Low back pain 03/24/2022   Multiple actinic keratoses    Lynden Ang, MD   Myalgia due to statin    Per records from [facility] in [place], [place]. Due to crestor    Non-toxic multinodular goiter 01/21/2017   Lynden Ang, MD   Normocytic anemia    Lynden Ang, MD   Obstructive sleep apnea syndrome    Per records from [facility] in [place], [place]. Patient did not tolerate CPAP machine    Onychomycosis    Per records from [facility] in [place], [place]    Osteoarthritis    Per [facility] New Patient packet    Osteopenia    Per records from [facility] in [place], [place]. Treated in the past with antiresorptive therapy   Renal cyst 10/11/2012   Simple on CT Scan 10/2012. Per records from [facility] in [place], [place]    Sensorineural hearing loss (SNHL), bilateral 01/28/2017   Lynden Ang , MD   Umbilical hernia    Per [facility] New Patient packet    Vitamin D deficiency    Lynden Ang, MD    Past Surgical History:  Procedure Laterality Date   ABDOMINAL HYSTERECTOMY  1989   One ovary, Per [facility] New Patient packet    ABLATION  1996   Radical, Per [facility] New Patient packet     BREAST CYST ASPIRATION Bilateral    numerous, over several years per pt, always benign   COLONOSCOPY  2019   Dr.Melissa Fayrene Fearing, Per [facility] New Patient packet    KNEE ARTHROSCOPY W/ MENISCAL REPAIR  2002   Per [facility] New Patient packet    MOUTH SURGERY     Dental Implant    REPLACEMENT TOTAL KNEE Left 2015   Per [facility] New Patient packet   RIGHT/LEFT HEART CATH AND CORONARY ANGIOGRAPHY N/A 02/07/2018   Procedure: RIGHT/LEFT HEART CATH AND CORONARY ANGIOGRAPHY;  Surgeon: Swazil[place], Peter M, MD;  Location: [facility] INVASIVE CV LAB;  Service: Cardiovascular;  Laterality: N/A;   TONSILLECTOMY  1951   Per [facility] New Patient packet    TOOTH EXTRACTION  01/12/2019   TOOTH EXTRACTION  06/05/2019   Preparing for dental implant     Current Medications: Current Outpatient Medications on File Prior to Visit  Medication Sig   acetaminophen (TYLENOL) 650 MG CR tablet Take 650 mg by mouth in the morning, at noon, and at bedtime.   Calcium Carb-Cholecalciferol (CALCIUM 600/VITAMIN D3 PO) Take 1 tablet by mouth 2 (two) times daily.   Cholecalciferol (VITAMIN D3) 25 MCG (1000 UT) CAPS Take 1,000 Units by mouth daily.    Loratadine (CLARITIN PO) Take by mouth daily.   metoprolol succinate (TOPROL-XL) 50 MG 24 hr tablet Take 2 tablets (100 mg total) by mouth in the morning AND 1 tablet (50 mg total) every evening.   Multiple Vitamins-Minerals (OCUVITE PO) Take 1 tablet by mouth daily.    polycarbophil (FIBERCON) 625 MG tablet Take 625 mg by mouth daily.   Probiotic Product (PHILLIPS COLON HEALTH PO) Take 1 capsule by mouth daily.    rivaroxaban (XARELTO) 20 MG TABS tablet Take 1 tablet (20 mg total) by mouth daily with supper.   rosuvastatin (CRESTOR) 5 MG tablet Take 1 tablet (5 mg total) by mouth 4 (four) times a week.   sacubitril-valsartan (ENTRESTO) 49-51 MG Take 1 tablet by mouth 2 (two) times daily.   No current facility-administered medications on file prior to visit.     Allergies:   Latex, Adhesive [tape], Grass  extracts [gramineae pollens], Lactose intolerance (gi), Naprosyn [naproxen], Coq10 [coenzyme q10], and Zocor [simvastatin]   Social History   Tobacco Use   Smoking status: Never   Smokeless tobacco: Never  Vaping Use   Vaping Use: Never used  Substance Use Topics   Alcohol use: Yes    Comment: 2-3 a month - social   Drug use: Never    Family History: The patient's family history includes AAA (abdominal aortic aneurysm) in her sister; Alzheimer's disease (age of onset: 28) in her father; Arthritis (age of onset: 62) in her mother; Breast cancer in her paternal aunt; CVA in her maternal grandfather; Cancer in her maternal grandfather; Congenital heart disease in her daughter; Congestive Heart Failure in her mother; Dementia in her father; Diabetes in her father and sister; Diabetes (age of onset: 53) in her mother; Diabetes (age of onset: 54) in her maternal grandfather; Heart attack in her maternal aunt; Hyperlipidemia in her father; Lung cancer (age of onset: 44) in her maternal grandmother; Stroke in her father.  ROS:   Please see the history of present illness.   (+) Left LE pain (+) Exertional shortness of breath (+) Occasional epistaxis Additional pertinent ROS otherwise unremarkable   EKGs/Labs/Other Studies Reviewed:    The following studies were reviewed today:  Monitor 01/22/19: 7 days of data recorded on Zio monitor. Patient had a min HR of 46 bpm, max HR of 111 bpm, and avg HR of 68 bpm. Patient was in atrial fibrillation 100% of the time. No VT, high degree block, or pauses noted. Isolated ventricular ectopy was rare (<1%). There were 0 triggered events.   Echo  05/24/2018:  1. The left ventricle has normal systolic function, with an ejection  fraction of 55-60%. The cavity size was normal. Left ventricular diastolic  Doppler parameters are indeterminate secondary to atrial fibrillation.   2. The right ventricle has  normal systolic function. The cavity was  normal.  There is no increase in right ventricular wall thickness.   3. Left atrial size was severely dilated.   4. Mitral valve regurgitation is mild to moderate by color flow Doppler.   5. The aortic valve is tricuspid. No stenosis of the aortic valve.   Monitor 02/13/18 ~3 days of data recorded on Zio monitor. No high degree block or pauses noted. Isolated atrial and ventricular ectopy was rare (<1%). There were 2 patient triggered events, one SVT and one sinus rhythm with PVC.   1 run of tachycardia occurred lasting 11 beats with a max rate of 200 bpm (avg 168 bpm). It is labeled as VT on the monitor. While VT cannot be excluded, it appears likely atrial fibrillation with rate related aberrancy. Atrial Fibrillation occurred continuously (100% burden), ranging from 51-163 bpm (avg of 92 bpm). Isolated VEs were occasional (2.7%), VE Couplets were rare (<1.0%), and VE Triplets were rare (<1.0%, 1). Ventricular Bigeminy and Trigeminy were rare, with individual episodes lasting 5 seconds or less.  Cath 02/07/18 Prox LAD lesion is 20% stenosed. LV end diastolic pressure is mildly elevated. There is moderate left ventricular systolic dysfunction. Hemodynamic findings consistent with mild pulmonary hypertension.   1. Minimal nonobstructive CAD with calcified plaque in the proximal LAD 2. Moderate LV dysfunction. EF estimated at 40-45%.  3. Mildly elevated LV filling pressures. V waves prominent on PCWP tracing 4. Mild pulmonary HTN 5. Low cardiac output. Index 2.06. 6. Mild to Moderate MR.    Plan: Optimize medical therapy for CHF. Consider repeat Echo once medical therapy optimized to reassess valve regurgitation.   Fick Cardiac Output 3.88 L/min  Fick Cardiac Output Index 2.06 (L/min)/BSA  RA A Wave -99 mmHg  RA V Wave 11 mmHg  RA Mean 9 mmHg  RV Systolic Pressure 44 mmHg  RV Diastolic Pressure 2 mmHg  RV EDP 9 mmHg  PA Systolic Pressure 47 mmHg  PA Diastolic Pressure 13 mmHg  PA Mean 34 mmHg   PW A Wave -99 mmHg  PW V Wave 32 mmHg  PW Mean 21 mmHg  AO Systolic Pressure 137 mmHg  AO Diastolic Pressure 73 mmHg  AO Mean 99 mmHg  LV Systolic Pressure 137 mmHg  LV Diastolic Pressure 8 mmHg  LV EDP 18 mmHg  AOp Systolic Pressure 138 mmHg  AOp Diastolic Pressure 67 mmHg  AOp Mean Pressure 94 mmHg  LVp Systolic Pressure 148 mmHg  LVp Diastolic Pressure 10 mmHg  LVp EDP Pressure 15 mmHg  QP/QS 1  TPVR Index 16.45 HRUI  TSVR Index 47.91 HRUI  PVR SVR Ratio 0.1  TPVR/TSVR Ratio 0.34   Echo 01/25/18 Study Conclusions - Left ventricle: The cavity size was normal. Systolic function was   moderately reduced. The estimated ejection fraction was in the   range of 35% to 40%. Although no diagnostic regional wall motion   abnormality was identified, this possibility cannot be completely   excluded on the basis of this study. - Aortic valve: Transvalvular velocity was within the normal range.   There was no stenosis. There was no regurgitation. - Mitral valve: Moderately calcified annulus. There was moderate   regurgitation. - Left atrium: The atrium was moderately dilated. - Right ventricle: The cavity size was mildly dilated. Wall   thickness was normal. Systolic function was normal. RV systolic   pressure (S, est): 44 mm Hg. - Right atrium: The atrium was moderately dilated. Central venous   pressure (est):  10 mm Hg. - Tricuspid valve: Dilated annulus. Structurally normal valve.   There was moderate-severe regurgitation. - Pulmonic valve: There was mild regurgitation. - Pulmonary arteries: Systolic pressure was moderately increased.   PA peak pressure: 44 mm Hg (S). - Inferior vena cava: The vessel was normal in size. The   respirophasic diameter changes were blunted (< 50%). - Pericardium, extracardiac: There was no pericardial effusion.  EKG:  EKG is personally reviewed.   06/01/2022:  atrial fibrillation at 82 bpm 11/26/2021:  not ordered 05/25/21: atrial fibrillation at  84 bpm 11/07/20 atrial fibrillation at 102 bpm 11/06/19 atrial fibrillation with heart rate 93 bpm and poor R wave progression.  Recent Labs: 03/24/2022: ALT 6; BUN 17; Creatinine, Ser 1.16; Hemoglobin 13.3; Platelets 175; Potassium 4.9; Sodium 139; TSH 1.770   Recent Lipid Panel    Component Value Date/Time   CHOL 200 (H) 03/24/2022 1437   TRIG 69 03/24/2022 1437   HDL 104 03/24/2022 1437   CHOLHDL 1.9 03/24/2022 1437   CHOLHDL 2.2 09/23/2020 0940   LDLCALC 84 03/24/2022 1437   LDLCALC 91 09/23/2020 0940    Physical Exam:    VS:  BP 124/82 (BP Location: Left Arm, Patient Position: Sitting, Cuff Size: Large)   Pulse 82   Ht 5\' 2"  (1.575 m)   Wt 204 lb 6.4 oz (92.7 kg)   BMI 37.39 kg/m     Wt Readings from Last 3 Encounters:  06/01/22 204 lb 6.4 oz (92.7 kg)  03/24/22 205 lb (93 kg)  03/16/22 207 lb 11.2 oz (94.2 kg)    GEN: Well nourished, well developed in no acute distress HEENT: Normal, moist mucous membranes NECK: No JVD CARDIAC: irregularly irregular rhythm, normal S1 and S2, no rubs or gallops. 1/6 systolic murmur. VASCULAR: Radial and DP pulses 2+ bilaterally. No carotid bruits RESPIRATORY:  Clear to auscultation without rales, wheezing or rhonchi  ABDOMEN: Soft, non-tender, non-distended MUSCULOSKELETAL:  Ambulates independently SKIN: Warm and dry, trace LE edema R>L NEUROLOGIC:  Alert and oriented x 3. No focal neuro deficits noted. PSYCHIATRIC:  Normal affect    ASSESSMENT:    1. Nonischemic cardiomyopathy (HCC)   2. Dyspnea on exertion   3. Permanent atrial fibrillation (HCC)   4. Secondary hypercoagulable state (HCC)   5. Long term current use of anticoagulant   6. Essential hypertension   7. Pure hypercholesterolemia     PLAN:    Nonischemic cardiomyopathy with reduced EF, moderate MR, and moderate-severe TR Dyspnea on exertion -cath with minimal nonobstructive CAD -MR more mild-moderate on cath; improved MR and TR on most recent  echo -tolerating entresto well. Did discuss that even with improvement in EF, recommendations are to continue entresto given good response. -continue metoprolol succinate -overall NYHA II symptoms, with walking but not with water exercise or low impact exercise   Atrial fibrillation: permanent  -monitor 01/2018 showed 100% burden of afib. Repeat monitor showed the same 01/2019. -see prior notes. We have discussed attempts at restoration of sinus rhythm. Amiodarone did not keep her in rhythm. She wished to pursue rate control strategy -rate control with metoprolol -CHA2DS2/VAS Stroke Risk Points=3, continue rivaroxaban for anticoagulation   Hypertension:  -at goal -Continue entresto and metoprolol   Hyperlipidemia (hypercholesterolemia):  -continue rosuvastatin, tolerating 4x/week  CV risk counseling and prevention: -recommend heart healthy/Mediterranean diet, with whole grains, fruits, vegetable, fish, lean meats, nuts, and olive oil. Limit salt. -recommend moderate walking, 3-5 times/week for 30-50 minutes each session. Aim for at least 150 minutes.week.  Goal should be pace of 3 miles/hours, or walking 1.5 miles in 30 minutes -recommend avoidance of tobacco products. Avoid excess alcohol.  Follow up: 6 months or sooner as needed  [name], MD, PhD [facility]  [facility]   Patient Instructions  Medication Instructions:  Your physician recommends that you continue on your current medications as directed. Please refer to the Current Medication list given to you today.  *If you need a refill on your cardiac medications before your next appointment, please call your pharmacy*  Lab Work: NONE  Testing/Procedures: NONE  Follow-Up: At [facility], you and your health needs are our priority.  As part of our continuing mission to provide you with exceptional heart care, we have created designated Provider Care Teams.  These Care Teams include your primary  Cardiologist (physician) and Advanced Practice Providers (APPs -  Physician Assistants and Nurse Practitioners) who all work together to provide you with the care you need, when you need it.  We recommend signing up for the patient portal called "MyChart".  Sign up information is provided on this After Visit Summary.  MyChart is used to connect with patients for Virtual Visits (Telemedicine).  Patients are able to view lab/test results, encounter notes, upcoming appointments, etc.  Non-urgent messages can be sent to your provider as well.   To learn more about what you can do with MyChart, go to [email or web address].    Your next appointment:   6 month(s)  The format for your next appointment:   In Person  Provider:   [name], MD       [facility],acting as a scribe for [name], MD.,have documented all relevant documentation on the behalf of [name], MD,as directed by  [name], MD while in the presence of [name], MD.  I, [name], MD, have reviewed all documentation for this visit. The documentation on 06/01/22 for the exam, diagnosis, procedures, and orders are all accurate and complete.   Signed, [name], MD PhD 06/01/2022  [facility]

## 2022-06-01 NOTE — Patient Instructions (Signed)
Medication Instructions:  Your physician recommends that you continue on your current medications as directed. Please refer to the Current Medication list given to you today.   *If you need a refill on your cardiac medications before your next appointment, please call your pharmacy*  Lab Work: NONE  Testing/Procedures: NONE   Follow-Up: At [facility], you and your health needs are our priority.  As part of our continuing mission to provide you with exceptional heart care, we have created designated Provider Care Teams.  These Care Teams include your primary Cardiologist (physician) and Advanced Practice Providers (APPs -  Physician Assistants and Nurse Practitioners) who all work together to provide you with the care you need, when you need it.  We recommend signing up for the patient portal called "MyChart".  Sign up information is provided on this After Visit Summary.  MyChart is used to connect with patients for Virtual Visits (Telemedicine).  Patients are able to view lab/test results, encounter notes, upcoming appointments, etc.  Non-urgent messages can be sent to your provider as well.   To learn more about what you can do with MyChart, go to [email or web address].    Your next appointment:   6 month(s)  The format for your next appointment:   In Person  Provider:   Bridgette Christopher, MD             

## 2022-06-09 ENCOUNTER — Other Ambulatory Visit (HOSPITAL_BASED_OUTPATIENT_CLINIC_OR_DEPARTMENT_OTHER): Payer: Self-pay

## 2022-06-23 ENCOUNTER — Other Ambulatory Visit (HOSPITAL_BASED_OUTPATIENT_CLINIC_OR_DEPARTMENT_OTHER): Payer: Self-pay

## 2022-06-24 ENCOUNTER — Other Ambulatory Visit (HOSPITAL_BASED_OUTPATIENT_CLINIC_OR_DEPARTMENT_OTHER): Payer: Self-pay

## 2022-07-11 ENCOUNTER — Other Ambulatory Visit (HOSPITAL_BASED_OUTPATIENT_CLINIC_OR_DEPARTMENT_OTHER): Payer: Self-pay

## 2022-07-15 ENCOUNTER — Encounter (HOSPITAL_BASED_OUTPATIENT_CLINIC_OR_DEPARTMENT_OTHER): Payer: Self-pay | Admitting: Cardiology

## 2022-07-20 ENCOUNTER — Encounter: Payer: Self-pay | Admitting: Dermatology

## 2022-07-20 ENCOUNTER — Ambulatory Visit: Payer: Medicare Other | Admitting: Dermatology

## 2022-07-20 ENCOUNTER — Other Ambulatory Visit (HOSPITAL_BASED_OUTPATIENT_CLINIC_OR_DEPARTMENT_OTHER): Payer: Self-pay

## 2022-07-20 VITALS — BP 135/80 | HR 94

## 2022-07-20 DIAGNOSIS — L720 Epidermal cyst: Secondary | ICD-10-CM | POA: Diagnosis not present

## 2022-07-20 DIAGNOSIS — Z1283 Encounter for screening for malignant neoplasm of skin: Secondary | ICD-10-CM

## 2022-07-20 DIAGNOSIS — L578 Other skin changes due to chronic exposure to nonionizing radiation: Secondary | ICD-10-CM

## 2022-07-20 DIAGNOSIS — L814 Other melanin hyperpigmentation: Secondary | ICD-10-CM

## 2022-07-20 DIAGNOSIS — D1801 Hemangioma of skin and subcutaneous tissue: Secondary | ICD-10-CM | POA: Diagnosis not present

## 2022-07-20 DIAGNOSIS — W908XXA Exposure to other nonionizing radiation, initial encounter: Secondary | ICD-10-CM | POA: Diagnosis not present

## 2022-07-20 DIAGNOSIS — L608 Other nail disorders: Secondary | ICD-10-CM

## 2022-07-20 DIAGNOSIS — L821 Other seborrheic keratosis: Secondary | ICD-10-CM | POA: Diagnosis not present

## 2022-07-20 DIAGNOSIS — L219 Seborrheic dermatitis, unspecified: Secondary | ICD-10-CM | POA: Diagnosis not present

## 2022-07-20 DIAGNOSIS — D229 Melanocytic nevi, unspecified: Secondary | ICD-10-CM

## 2022-07-20 MED ORDER — TRIAMCINOLONE ACETONIDE 10 MG/ML IJ SUSP
10.0000 mg | Freq: Once | INTRAMUSCULAR | Status: AC
  Administered 2022-07-20: 10 mg

## 2022-07-20 MED ORDER — NYSTATIN-TRIAMCINOLONE 100000-0.1 UNIT/GM-% EX OINT
TOPICAL_OINTMENT | CUTANEOUS | 0 refills | Status: DC
  Filled 2022-07-20: qty 30, 14d supply, fill #0

## 2022-07-20 NOTE — Patient Instructions (Addendum)
Thank you for visiting my office today. I appreciate your proactive approach to your dermatological health. Here is a summary of the key points from our consultation:  - Skin Examination: No suspicious signs of skin cancer were observed. Continue monthly self-examinations and schedule annual check-ups, or sooner if you notice any changes.  - Skin Conditions Noted:   - Cherry angiomas, seborrheic keratosis, and keratoses - all benign.   - Seborrheic dermatitis behind the ear - prescribed Triamcinolone cream, apply twice daily for one week.   - Cyst on earlobe - treated with an injection of Kenalog 10.   - Toenail fungus - consider seeing a podiatrist for further evaluation and treatment.  - Skin Care Recommendations:   - For the redness on the right eyelid, consider discontinuing the use of La Roche-Posay eye cream if irritation persists.    - For dry, brittle nails, use Elon Nail Conditioner nightly; avoid nail polish for two months to allow nails to hydrate.    - Maintain moderate sun exposure for vitamin D production; use sunscreen and wear hats during prolonged outdoor activities.  Please ensure to follow the care instructions provided and do not hesitate to contact the office if you have any questions or concerns.           Due to recent changes in healthcare laws, you may see results of your pathology and/or laboratory studies on MyChart before the doctors have had a chance to review them. We understand that in some cases there may be results that are confusing or concerning to you. Please understand that not all results are received at the same time and often the doctors may need to interpret multiple results in order to provide you with the best plan of care or course of treatment. Therefore, we ask that you please give [place] 2 business days to thoroughly review all your results before contacting the office for clarification. Should we see a critical lab result, you will be  contacted sooner.   If You Need Anything After Your Visit  If you have any questions or concerns for your doctor, please call our main line at [phone] If no one answers, please leave a voicemail as directed and we will return your call as soon as possible. Messages left after 4 pm will be answered the following business day.   You may also send [place] a message via MyChart. We typically respond to MyChart messages within 1-2 business days.  For prescription refills, please ask your pharmacy to contact our office. Our fax number is [phone].  If you have an urgent issue when the clinic is closed that cannot wait until the next business day, you can page your doctor at the number below.    Please note that while we do our best to be available for urgent issues outside of office hours, we are not available 24/7.   If you have an urgent issue and are unable to reach [place], you may choose to seek medical care at your doctor's office, retail clinic, urgent care center, or emergency room.  If you have a medical emergency, please immediately call 911 or go to the emergency department. In the event of inclement weather, please call our main line at [phone] for an update on the status of any delays or closures.  Dermatology Medication Tips: Please keep the boxes that topical medications come in in order to help keep track of the instructions about where and how to use these. Pharmacies typically print  the medication instructions only on the boxes and not directly on the medication tubes.   If your medication is too expensive, please contact our office at [phone] or send [place] a message through MyChart.   We are unable to tell what your co-pay for medications will be in advance as this is different depending on your insurance coverage. However, we may be able to find a substitute medication at lower cost or fill out paperwork to get insurance to cover a needed medication.   If a prior  authorization is required to get your medication covered by your insurance company, please allow [place] 1-2 business days to complete this process.  Drug prices often vary depending on where the prescription is filled and some pharmacies may offer cheaper prices.  The website www.goodrx.com contains coupons for medications through different pharmacies. The prices here do not account for what the cost may be with help from insurance (it may be cheaper with your insurance), but the website can give you the price if you did not use any insurance.  - You can print the associated coupon and take it with your prescription to the pharmacy.  - You may also stop by our office during regular business hours and pick up a GoodRx coupon card.  - If you need your prescription sent electronically to a different pharmacy, notify our office through [facility] or by phone at [phone]

## 2022-07-20 NOTE — Progress Notes (Unsigned)
New Patient Visit   Subjective  Jennifer Mcclain is a 77 y.o. female who presents for the following: Skin Cancer Screening and Full Body Skin Exam. Pt does not have a hx of skin. No family hx of skin cancer. Pt has bump on cheek for years. It is asymptomatic and never treated. She has some rough spots behind ear and on back. Spot under left for years but seems like it may be larger.  The patient presents for Total-Body Skin Exam (TBSE) for skin cancer screening and mole check. The patient has spots, moles and lesions to be evaluated, some may be new or changing and the patient has concerns that these could be cancer.    The following portions of the chart were reviewed this encounter and updated as appropriate: medications, allergies, medical history  Review of Systems:  No other skin or systemic complaints except as noted in HPI or Assessment and Plan.  Objective  Well appearing patient in no apparent distress; mood and affect are within normal limits.  A full examination was performed including scalp, head, eyes, ears, nose, lips, neck, chest, axillae, abdomen, back, buttocks, bilateral upper extremities, bilateral lower extremities, hands, feet, fingers, toes, fingernails, and toenails. All findings within normal limits unless otherwise noted below.   Relevant physical exam findings are noted in the Assessment and Plan.    Assessment & Plan   SKIN CANCER SCREENING PERFORMED TODAY.  ACTINIC DAMAGE - Chronic condition, secondary to cumulative UV/sun exposure - diffuse scaly erythematous macules with underlying dyspigmentation - Recommend daily broad spectrum sunscreen SPF 30+ to sun-exposed areas, reapply every 2 hours as needed.  - Staying in the shade or wearing long sleeves, sun glasses (UVA+UVB protection) and wide brim hats (4-inch brim around the entire circumference of the hat) are also recommended for sun protection.  - Call for new or changing lesions.  LENTIGINES  on trunk   SEBORRHEIC KERATOSES all over Brown waxy papules  HEMANGIOMAS on chest Pink red papules - Benign normal skin lesions - Benign-appearing - Call for any changes  MELANOCYTIC NEVI - Tan-brown and/or pink-flesh-colored symmetric macules and papules - Benign appearing on exam today - Observation - Call clinic for new or changing moles - Recommend daily use of broad spectrum spf 30+ sunscreen to sun-exposed areas.   Nail ridging  Treatment Plan: Elon nail cream to bed of nails daily     SEBORRHEIC DERMATITIS Exam: Pink patches with greasy scale at posterior ear  wellcontrolled vs flared vs notatgoal  Seborrheic Dermatitis is a chronic persistent rash characterized by pinkness and scaling most commonly of the mid face but also can occur on the scalp (dandruff), ears; mid chest, mid back and groin.  It tends to be exacerbated by stress and cooler weather.  People who have neurologic disease may experience new onset or exacerbation of existing seborrheic dermatitis.  The condition is not curable but treatable and can be controlled.  Treatment Plan: Nystatin/triamcinolone cream behind ear twice a day for up to 2 weeks- Epidermal cyst Right Ear  Procedure Note Intralesional Injection  Location: right lower ear lobe  Informed Consent: Discussed risks (infection, pain, bleeding, bruising, thinning of the skin, loss of skin pigment, lack of resolution, and recurrence of lesion) and benefits of the procedure, as well as the alternatives. Informed consent was obtained. Preparation: The area was prepared a standard fashion.  Anesthesia: NA  Procedure Details: An intralesional injection was performed with Kenalog 10 mg/cc. Marland Kitchen05 cc in total were  injected. NDC #: 1610-9604-54 Exp: 01/11/24  Total number of injections: 1  Plan: The patient was instructed on post-op care. Recommend OTC analgesia as needed for pain.   Destruction of lesion - Right Ear  Destruction method:  chemical removal    Related Medications triamcinolone acetonide (KENALOG) 10 MG/ML injection 10 mg     Return in about 1 year (around 07/20/2023) for TBSE.  Owens Shark, CMA, am acting as scribe for [employer], DO.   Documentation: I have reviewed the above documentation for accuracy and completeness, and I agree with the above.  Langston Reusing, DO

## 2022-07-21 ENCOUNTER — Other Ambulatory Visit (HOSPITAL_BASED_OUTPATIENT_CLINIC_OR_DEPARTMENT_OTHER): Payer: Self-pay

## 2022-07-22 ENCOUNTER — Other Ambulatory Visit (HOSPITAL_BASED_OUTPATIENT_CLINIC_OR_DEPARTMENT_OTHER): Payer: Self-pay

## 2022-08-07 ENCOUNTER — Other Ambulatory Visit (HOSPITAL_BASED_OUTPATIENT_CLINIC_OR_DEPARTMENT_OTHER): Payer: Self-pay

## 2022-08-09 ENCOUNTER — Other Ambulatory Visit: Payer: Self-pay

## 2022-08-09 ENCOUNTER — Other Ambulatory Visit (HOSPITAL_BASED_OUTPATIENT_CLINIC_OR_DEPARTMENT_OTHER): Payer: Self-pay

## 2022-08-20 ENCOUNTER — Other Ambulatory Visit (HOSPITAL_BASED_OUTPATIENT_CLINIC_OR_DEPARTMENT_OTHER): Payer: Self-pay

## 2022-09-06 ENCOUNTER — Other Ambulatory Visit (HOSPITAL_BASED_OUTPATIENT_CLINIC_OR_DEPARTMENT_OTHER): Payer: Self-pay

## 2022-09-18 ENCOUNTER — Other Ambulatory Visit (HOSPITAL_BASED_OUTPATIENT_CLINIC_OR_DEPARTMENT_OTHER): Payer: Self-pay

## 2022-09-22 ENCOUNTER — Other Ambulatory Visit (HOSPITAL_BASED_OUTPATIENT_CLINIC_OR_DEPARTMENT_OTHER): Payer: Medicare Other

## 2022-09-22 DIAGNOSIS — R7989 Other specified abnormal findings of blood chemistry: Secondary | ICD-10-CM

## 2022-09-22 LAB — COMPREHENSIVE METABOLIC PANEL
ALT: 6 IU/L (ref 0–32)
AST: 15 IU/L (ref 0–40)
Albumin: 4.3 g/dL (ref 3.8–4.8)
Alkaline Phosphatase: 83 IU/L (ref 44–121)
BUN/Creatinine Ratio: 14 (ref 12–28)
BUN: 14 mg/dL (ref 8–27)
Bilirubin Total: 0.5 mg/dL (ref 0.0–1.2)
CO2: 24 mmol/L (ref 20–29)
Calcium: 10.1 mg/dL (ref 8.7–10.3)
Chloride: 104 mmol/L (ref 96–106)
Creatinine, Ser: 1.03 mg/dL — ABNORMAL HIGH (ref 0.57–1.00)
Globulin, Total: 2.2 g/dL (ref 1.5–4.5)
Glucose: 71 mg/dL (ref 70–99)
Potassium: 5 mmol/L (ref 3.5–5.2)
Sodium: 143 mmol/L (ref 134–144)
Total Protein: 6.5 g/dL (ref 6.0–8.5)
eGFR: 57 mL/min/{1.73_m2} — ABNORMAL LOW (ref >59)

## 2022-09-29 ENCOUNTER — Encounter (HOSPITAL_BASED_OUTPATIENT_CLINIC_OR_DEPARTMENT_OTHER): Payer: Self-pay | Admitting: Family Medicine

## 2022-09-29 ENCOUNTER — Ambulatory Visit (HOSPITAL_BASED_OUTPATIENT_CLINIC_OR_DEPARTMENT_OTHER): Payer: Medicare Other | Admitting: Family Medicine

## 2022-09-29 VITALS — BP 152/88 | HR 76 | Ht 62.0 in | Wt 198.0 lb

## 2022-09-29 DIAGNOSIS — I482 Chronic atrial fibrillation, unspecified: Secondary | ICD-10-CM | POA: Diagnosis not present

## 2022-09-29 DIAGNOSIS — L603 Nail dystrophy: Secondary | ICD-10-CM | POA: Diagnosis not present

## 2022-09-29 DIAGNOSIS — M25561 Pain in right knee: Secondary | ICD-10-CM | POA: Diagnosis not present

## 2022-09-29 DIAGNOSIS — M25532 Pain in left wrist: Secondary | ICD-10-CM | POA: Diagnosis not present

## 2022-09-29 DIAGNOSIS — M549 Dorsalgia, unspecified: Secondary | ICD-10-CM

## 2022-09-29 DIAGNOSIS — G8929 Other chronic pain: Secondary | ICD-10-CM | POA: Diagnosis not present

## 2022-09-29 NOTE — Progress Notes (Signed)
Established Patient Office Visit  Subjective   Patient ID: Jennifer Mcclain, female    DOB: 12/01/1945  Age: 77 y.o. MRN: [id number]  Chief Complaint  Patient presents with   Back Pain   Niyathi Bhullar is a 77 year-old female patient who presents today for various concerns.   Back pain- notices when she does water aerobics, when she first gets into bed. Almost about a year. Is not constant. Is an dull ache.  Mid back pain thoracic/lumbar- she reports she thinks it is her kidneys  Denies urinary sx  L wrist- small nodule with tenderness to palpation  0.5cm diameter- darkish blue color  Present for about 6 months   Knee pain- taking tylenol, worse at night  She ignores during the day  Does not walk on it during the day. Avoids steps to avoid pain.  Walking and movement makes it worse.   Nails- get rough and break easily  Elon nail cream   Review of Systems  Constitutional:  Negative for malaise/fatigue.  HENT:  Negative for ear pain and tinnitus.   Eyes:  Negative for blurred vision and double vision.  Respiratory:  Negative for cough and shortness of breath.   Cardiovascular:  Negative for chest pain.  Gastrointestinal:  Negative for abdominal pain, nausea and vomiting.  Musculoskeletal:  Positive for back pain and joint pain (R knee). Negative for myalgias.  Neurological:  Negative for dizziness, weakness and headaches.  Psychiatric/Behavioral:  Negative for depression and suicidal ideas. The patient is not nervous/anxious.       Objective:     BP (!) 152/88   Pulse 76   Ht 5\' 2"  (1.575 m)   Wt 198 lb (89.8 kg)   SpO2 98%   BMI 36.21 kg/m  BP Readings from Last 3 Encounters:  09/29/22 (!) 152/88  07/20/22 135/80  06/01/22 124/82     Physical Exam Vitals reviewed.  Constitutional:      Appearance: Normal appearance.  Cardiovascular:     Rate and Rhythm: Normal rate. Rhythm irregular.     Pulses: Normal pulses.     Heart sounds: Normal heart  sounds.  Pulmonary:     Effort: Pulmonary effort is normal.     Breath sounds: Normal breath sounds.  Neurological:     Mental Status: She is alert.       Assessment & Plan:  Mid back pain -     Ambulatory referral to Physical Medicine Rehab  Chronic pain of right knee -     Ambulatory referral to Physical Medicine Rehab  Brittle nails  Left wrist pain     Return in about 5 months (around 03/14/2023) for chronic conditions .    Alyson Reedy, FNP

## 2022-10-01 ENCOUNTER — Encounter (HOSPITAL_BASED_OUTPATIENT_CLINIC_OR_DEPARTMENT_OTHER): Payer: Self-pay | Admitting: Family Medicine

## 2022-10-03 DIAGNOSIS — L603 Nail dystrophy: Secondary | ICD-10-CM | POA: Insufficient documentation

## 2022-10-03 DIAGNOSIS — M25532 Pain in left wrist: Secondary | ICD-10-CM | POA: Insufficient documentation

## 2022-10-03 DIAGNOSIS — G8929 Other chronic pain: Secondary | ICD-10-CM | POA: Insufficient documentation

## 2022-10-03 NOTE — Assessment & Plan Note (Signed)
On lateral aspect of wrist, patient has a moveable, tender nodule that is about 0.5cm in diameter. She reports she has noticed this for the past 6 months. Unclear etiology. Possible ganglion cyst. Will obtain x-ray of left wrist as initial source of imaging.

## 2022-10-03 NOTE — Assessment & Plan Note (Signed)
Patient presents today with chronic LBP x1 year. She is currently involved in aquatic physical therapy. She reports a dull ache in the mid thoracic/lumbar region. Tenderness to palpation. Feel this is musculoskeletal in nature. Less likely related to nephrology etiology. No CVA tenderness, no urinary sxs. Denies saddle anesthesia, incontinence of bladder and bowels, numbness/tingling in lower extremities, weakness. No red flags present. Patient would prefer chiropractic care- as she is already involved in PT. Referral placed to physical medicine.

## 2022-10-03 NOTE — Progress Notes (Signed)
Established Patient Office Visit  Subjective   Patient ID: Jennifer Mcclain, female    DOB: March 12, 1945  Age: 77 y.o. MRN: [id number]  Chief Complaint  Patient presents with   Back Pain   Jennifer Mcclain is a 77 year-old female patient who presents today for various concerns.   Back pain- notices when she does water aerobics, when she first gets into bed. Almost about a year. Is not constant. Is an dull ache.  Mid back pain thoracic/lumbar- she reports she thinks it is her kidneys  Denies urinary sx  L wrist- small nodule with tenderness to palpation  0.5cm diameter- darkish blue color  Present for about 6 months   Knee pain- taking tylenol, worse at night  She ignores during the day  Does not walk on it during the day. Avoids steps to avoid pain.  Walking and movement makes it worse.   Nails- get rough and break easily  Elon nail cream   Review of Systems  Constitutional:  Negative for malaise/fatigue.  HENT:  Negative for ear pain and tinnitus.   Eyes:  Negative for blurred vision and double vision.  Respiratory:  Negative for cough and shortness of breath.   Cardiovascular:  Negative for chest pain.  Gastrointestinal:  Negative for abdominal pain, nausea and vomiting.  Musculoskeletal:  Positive for back pain and joint pain (R knee). Negative for myalgias.  Neurological:  Negative for dizziness, weakness and headaches.  Psychiatric/Behavioral:  Negative for depression and suicidal ideas. The patient is not nervous/anxious.     Objective:     BP (!) 152/88   Pulse 76   Ht 5\' 2"  (1.575 m)   Wt 198 lb (89.8 kg)   SpO2 98%   BMI 36.21 kg/m  BP Readings from Last 3 Encounters:  09/29/22 (!) 152/88  07/20/22 135/80  06/01/22 124/82     Physical Exam Vitals reviewed.  Constitutional:      Appearance: Normal appearance.  Cardiovascular:     Rate and Rhythm: Normal rate. Rhythm irregular.     Pulses: Normal pulses.     Heart sounds: Normal heart sounds.   Pulmonary:     Effort: Pulmonary effort is normal.     Breath sounds: Normal breath sounds.  Neurological:     Mental Status: She is alert.       Assessment & Plan:  Mid back pain Assessment & Plan: Patient presents today with chronic LBP x1 year. She is currently involved in aquatic physical therapy. She reports a dull ache in the mid thoracic/lumbar region. Tenderness to palpation. Feel this is musculoskeletal in nature. Less likely related to nephrology etiology. No CVA tenderness, no urinary sxs. Denies saddle anesthesia, incontinence of bladder and bowels, numbness/tingling in lower extremities, weakness. No red flags present. Patient would prefer chiropractic care- as she is already involved in PT. Referral placed to physical medicine.   Orders: -     Ambulatory referral to Physical Medicine Rehab  Chronic pain of right knee Assessment & Plan: Patient denies recent fall/trauma/injury. She reports she has had chronic knee pain that is worse by the end of the day and worsened with movement and ambulation. She is currently involved with aquatic physical therapy. Patient would like to see chiropractor. Referral sent to physical medicine.   Orders: -     Ambulatory referral to Physical Medicine Rehab  Brittle nails Assessment & Plan: Patient saw dermatology and was prescribed Elon for her nails She reports that she still  feels that they are rough and she needs to file them repeatedly. Advised her to f/u with derm.    Left wrist pain Assessment & Plan: On lateral aspect of wrist, patient has a moveable, tender nodule that is about 0.5cm in diameter. She reports she has noticed this for the past 6 months. Unclear etiology. Possible ganglion cyst. Will obtain x-ray of left wrist as initial source of imaging.   Orders: -     DG Wrist Complete Left; Future  Chronic atrial fibrillation [facility]) Assessment & Plan: Patient presents today with slightly elevated blood pressure. Patient in  no acute distress and is well-appearing. Cardiovascular exam with heart irregularly irregular rate and rhythm. No murmurs present. Followed by cardiology.       Return in about 5 months (around 03/14/2023) for chronic conditions .   Spent 30 minutes on this patient encounter, including preparation, chart review, face-to-face counseling with patient and coordination of care, and documentation of encounter.    Alyson Reedy, FNP

## 2022-10-03 NOTE — Assessment & Plan Note (Signed)
Patient saw dermatology and was prescribed Elon for her nails She reports that she still feels that they are rough and she needs to file them repeatedly. Advised her to f/u with derm.

## 2022-10-03 NOTE — Assessment & Plan Note (Signed)
Patient denies recent fall/trauma/injury. She reports she has had chronic knee pain that is worse by the end of the day and worsened with movement and ambulation. She is currently involved with aquatic physical therapy. Patient would like to see chiropractor. Referral sent to physical medicine.

## 2022-10-03 NOTE — Assessment & Plan Note (Addendum)
Patient presents today with slightly elevated blood pressure. Patient in no acute distress and is well-appearing. Cardiovascular exam with heart irregularly irregular rate and rhythm. No murmurs present. Followed by cardiology.

## 2022-10-06 ENCOUNTER — Other Ambulatory Visit (HOSPITAL_BASED_OUTPATIENT_CLINIC_OR_DEPARTMENT_OTHER): Payer: Self-pay

## 2022-10-06 DIAGNOSIS — Z1231 Encounter for screening mammogram for malignant neoplasm of breast: Secondary | ICD-10-CM | POA: Diagnosis not present

## 2022-10-06 LAB — HM MAMMOGRAPHY

## 2022-10-07 ENCOUNTER — Other Ambulatory Visit (HOSPITAL_BASED_OUTPATIENT_CLINIC_OR_DEPARTMENT_OTHER): Payer: Self-pay

## 2022-10-07 ENCOUNTER — Encounter: Payer: Self-pay | Admitting: Family Medicine

## 2022-10-12 ENCOUNTER — Encounter: Payer: Self-pay | Admitting: Physical Medicine & Rehabilitation

## 2022-10-15 ENCOUNTER — Encounter: Payer: Medicare Other | Attending: Physical Medicine & Rehabilitation | Admitting: Physical Medicine & Rehabilitation

## 2022-10-15 ENCOUNTER — Encounter: Payer: Self-pay | Admitting: Physical Medicine & Rehabilitation

## 2022-10-15 VITALS — BP 138/84 | HR 96 | Ht 62.0 in | Wt 199.0 lb

## 2022-10-15 DIAGNOSIS — G8929 Other chronic pain: Secondary | ICD-10-CM

## 2022-10-15 DIAGNOSIS — M545 Low back pain, unspecified: Secondary | ICD-10-CM | POA: Diagnosis not present

## 2022-10-15 NOTE — Progress Notes (Signed)
Subjective:    Patient ID: Jennifer Mcclain, female    DOB: 1945/04/07, 77 y.o.   MRN: [id number] CC:  Bilateral achilles and left buttocks pain  HPI 77 year old female who was referred by primary care provider for chronic pain of the right knee as well as mid back pain.  Patient states that she does have chronic back pain but indicates on her pain diagram that is lower on the left side.  Also complains of bilateral Achilles pain. Left sided low back pain  Sometimes feels with exercising  Had initial injury when she was in her 30s. Has tried ibuprofen for pain  No DC Has tried acupuncture for her ankle pain   Hx L humeral fracture now with arthritis  No hx of back surgery   LEFT TKR Hx Right knee arthroscopy   Seen by PT for achilles pain , has been doing aquatic exercise   No numbness or tingling in LEs, no bowel or bladder dysfunction, no weakness in the lower extremities.  Retired since 2019.  Independent with all self-care and mobility as well as household duties and shopping.  She continues to drive. Her pain is at a 2-3 out of 10 level. She sleeps about 8 hours per night walks mostly in the water about an hour 3 times a week.    Pain Inventory Average Pain 3 Pain Right Now 2 My pain is burning, dull, and aching  In the last 24 hours, has pain interfered with the following? General activity 4 Relation with others 0 Enjoyment of life 2 What TIME of day is your pain at its worst? night Sleep (in general) Good  Pain is worse with: walking Pain improves with: therapy/exercise, pacing activities, and medication Relief from Meds: 6  walk without assistance how many minutes can you walk? 5 ability to climb steps?  yes do you drive?  yes Do you have any goals in this area?  yes  not employed: date last employed 2019  bladder control problems bowel control problems trouble walking  Any changes since last visit?  no  Any changes since last visit?   no    Family History  Problem Relation Age of Onset   Congestive Heart Failure Mother    Diabetes Mother 52   Arthritis Mother 75   Stroke Father    Dementia Father    Diabetes Father    Hyperlipidemia Father    Alzheimer's disease Father 57   Diabetes Sister    Cancer Maternal Grandfather    Diabetes Maternal Grandfather 38   CVA Maternal Grandfather    AAA (abdominal aortic aneurysm) Sister    Congenital heart disease Daughter    Heart attack Maternal Aunt    Lung cancer Maternal Grandmother 32   Breast cancer Paternal Aunt    Social History   Socioeconomic History   Marital status: Married    Spouse name: Not on file   Number of children: Not on file   Years of education: Not on file   Highest education level: Not on file  Occupational History   Not on file  Tobacco Use   Smoking status: Never   Smokeless tobacco: Never  Vaping Use   Vaping status: Never Used  Substance and Sexual Activity   Alcohol use: Yes    Comment: 2-3 a month - social   Drug use: Never   Sexual activity: Not on file  Other Topics Concern   Not on file  Social History Narrative  Per [facility] New Patient Packet 12/06/17:      Diet: Low fat, dairy free      Caffeine: Yes      Married, if yes what year: Married, 1971      Do you live in a house, apartment, assisted living, condo, trailer, ect: Condo, 2 stories, 2 persons       Pets: No      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 No   Do you have difficulty preparing food or eating? No   Do you have difficulty managing your medications? No   Do you have difficulty managing your finances? No   Do you have difficulty affording your medications? No   Social Determinants of Health    Financial Resource Strain: Low Risk  (03/16/2022)   Overall Financial Resource Strain (CARDIA)    Difficulty of Paying Living Expenses: Not hard at all  Food Insecurity: No Food Insecurity (03/16/2022)   Hunger Vital Sign    Worried About Running Out of Food in the Last Year: Never true    Ran Out of Food in the Last Year: Never true  Transportation Needs: No Transportation Needs (03/16/2022)   PRAPARE - Administrator, Civil Service (Medical): No    Lack of Transportation (Non-Medical): No  Physical Activity: Sufficiently Active (03/16/2022)   Exercise Vital Sign    Days of Exercise per Week: 3 days    Minutes of Exercise per Session: 150+ min  Stress: No Stress Concern Present (03/16/2022)   [employer] of Occupational Health - Occupational Stress Questionnaire    Feeling of Stress : Not at all  Social Connections: Moderately Integrated (03/16/2022)   Social Connection and Isolation Panel [NHANES]    Frequency of Communication with Friends and Family: More than three times a week    Frequency of Social Gatherings with Friends and Family: More than three times a week    Attends Religious Services: Never    Database administrator or Organizations: Yes    Attends Engineer, structural: More than 4 times per year    Marital Status: Married   Past Surgical History:  Procedure Laterality Date   ABDOMINAL HYSTERECTOMY  1989   One ovary, Per [facility] New Patient packet    ABLATION  1996   Radical, Per [facility] New Patient packet    BREAST CYST ASPIRATION Bilateral    numerous, over several years per pt, always benign   COLONOSCOPY  2019   Dr.Melissa Fayrene Fearing, Per [facility] New Patient packet    KNEE ARTHROSCOPY W/ MENISCAL REPAIR  2002   Per [facility] New Patient packet    MOUTH SURGERY     Dental Implant    REPLACEMENT TOTAL KNEE Left 2015   Per [facility] New Patient packet   RIGHT/LEFT HEART CATH AND CORONARY ANGIOGRAPHY N/A 02/07/2018   Procedure: RIGHT/LEFT HEART CATH AND CORONARY  ANGIOGRAPHY;  Surgeon: [place], Peter M, MD;  Location: [facility] INVASIVE CV LAB;  Service: Cardiovascular;  Laterality: N/A;   TONSILLECTOMY  1951   Per [facility] New Patient packet    TOOTH EXTRACTION  01/12/2019   TOOTH EXTRACTION  06/05/2019   Preparing for dental  implant    Past Medical History:  Diagnosis Date   Achilles tendinitis    Per [facility] New Patient packet    Atrial fibrillation [facility])    Per records from [facility] in [place], [place]    Baker's cyst of knee, left    with resulting lower extremity edema   Benign cyst of breast, left    Per records from [facility] in [place], [place]. 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 Per records from [facility] in [place], [place]    H/O calculus of kidney during pregnancy    Lynden Ang, MD   H/O hysterectomy for benign disease    Lynden Ang, MD   History of fibula fracture    Right, Per records from [facility] in [place], [place]    History of torn meniscus of right knee    Per records from [facility] in [place], [place]    Humerus fracture    left leg- due to fall   Humerus fracture    Resulting from a fall, Per records from [facility] in [place], [place]    Hypercalcemia    Per records from [facility] in [place], [place]    Hypertension    Per [facility] New Patient packet    Kidney stone on left side    Per records  from [facility] in [place], [place]    Lactose intolerance    Per [facility] New Patient packet    Low back pain 03/24/2022   Multiple actinic keratoses    Lynden Ang, MD   Myalgia due to statin    Per records from [facility] in [place], [place]. Due to crestor    Non-toxic multinodular goiter 01/21/2017   Lynden Ang, MD   Normocytic anemia    Lynden Ang, MD   Obstructive sleep apnea syndrome    Per records from [facility] in [place], [place]. Patient did not tolerate CPAP machine    Onychomycosis    Per records from [facility] in [place], [place]    Osteoarthritis    Per [facility] New Patient packet    Osteopenia    Per records from [facility] in [place], [place]. Treated in the past with antiresorptive therapy   Renal cyst 10/11/2012   Simple on CT Scan 10/2012. 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   Lynden Ang , MD   Umbilical hernia    Per [facility] New Patient packet    Vitamin D deficiency    Lynden Ang, MD   BP 138/84   Pulse 96   Ht 5\' 2"  (1.575 m)   Wt 199 lb (90.3 kg)   SpO2 96%   BMI 36.40 kg/m  Opioid Risk Score:   Fall Risk Score:  `1  Depression screen PHQ 2/9     10/15/2022    1:09 PM 03/16/2022    3:39 PM 11/07/2020    3:56 PM 03/24/2020    2:22 PM 03/19/2019    2:33 PM 03/19/2019    2:21 PM 08/16/2018   10:55 AM  Depression screen PHQ 2/9  Decreased Interest 0 0 0 0 0 0 0  Down, Depressed, Hopeless 0 0 0 0 0 0 0  PHQ - 2 Score 0 0 0 0 0 0 0  Altered sleeping 0        Tired, decreased energy 0        Change in appetite 0        Feeling bad or failure about yourself  0        Trouble concentrating 0        Moving slowly or fidgety/restless 0        Suicidal thoughts 0        PHQ-9 Score 0        Difficult doing work/chores Not difficult at all            Review of Systems  Musculoskeletal:  Positive for gait problem.        Knee pain       Objective:   Physical Exam Vitals and nursing note reviewed.  Constitutional:      Appearance: She is obese.  HENT:     Head: Normocephalic and atraumatic.  Eyes:     Extraocular Movements: Extraocular movements intact.     Conjunctiva/sclera: Conjunctivae normal.     Pupils: Pupils are equal, round, and reactive to light.  Musculoskeletal:        General: No swelling or tenderness.     Right lower leg: No edema.     Left lower leg: No edema.  Skin:    General: Skin is warm and dry.  Neurological:     Mental Status: She is alert and oriented to person, place, and time.  Psychiatric:        Mood and Affect: Mood normal.        Behavior: Behavior normal.     Motor strength is 5/5 bilateral hip flexion knee extension ankle dorsiflexion Negative straight leg raising bilaterally Patient has mild tenderness at the lumbosacral junction mainly on the left side greater than right side. Sacroiliac test negative thigh thrust bilaterally positive Faber's on the right but negative on the left she did have some stretching sensation on the left side with Faber's. Negative hip distraction test other than pain over the ASIS area which is a negative Negative Thompson's test bilaterally No tenderness palpation over the Achilles area no pain with stretching of the Achilles no joint line tenderness at the ankle no pain with inversion eversion of the ankle.  No tendon tenderness over the anterior talar fib ligament.       Assessment & Plan:   1.  History of lumbar pain chronic mainly lumbosacral.  No clear-cut exacerbating factors other than walking.  This may be more of a lumbar facet arthropathy and will check x-rays of the lumbar spine to see if she does have evidence of this.  She does not have clear-cut radicular signs.  Her lower extremity pain may be more of a mild Achilles tendinosis. I do think she would benefit from a course stabilization exercises for her chronic low  back pain.  Continued weight loss.  Continue with aquatic exercises well for overall conditioning.  She is afraid of pushing herself too hard and exercise because she has atrial fibrillation but does state that she does not have any restrictions from cardiology.  I will see the patient back in clinic in 4 to 6 weeks and see how she is progressing.

## 2022-10-18 ENCOUNTER — Other Ambulatory Visit: Payer: Self-pay | Admitting: Cardiology

## 2022-10-18 DIAGNOSIS — I482 Chronic atrial fibrillation, unspecified: Secondary | ICD-10-CM

## 2022-10-19 ENCOUNTER — Other Ambulatory Visit (HOSPITAL_BASED_OUTPATIENT_CLINIC_OR_DEPARTMENT_OTHER): Payer: Self-pay

## 2022-10-19 MED ORDER — RIVAROXABAN 20 MG PO TABS
20.0000 mg | ORAL_TABLET | Freq: Every day | ORAL | 1 refills | Status: DC
  Filled 2022-10-19: qty 30, 30d supply, fill #0
  Filled 2022-10-19: qty 90, 90d supply, fill #0
  Filled 2022-11-18: qty 30, 30d supply, fill #1
  Filled 2022-12-15: qty 30, 30d supply, fill #2
  Filled 2023-01-12: qty 90, 90d supply, fill #3
  Filled 2023-01-13 – 2023-02-11 (×2): qty 30, 30d supply, fill #4
  Filled 2023-03-21: qty 30, 30d supply, fill #5

## 2022-10-19 NOTE — Telephone Encounter (Signed)
Prescription refill request for Xarelto received.  Indication: Afib  Last office visit: 06/01/22 [name])  Weight: 90.3kg Age: 77 Scr: 1.03 (09/22/22)  CrCl: 67.14ml/min  Appropriate dose. Refill sent.

## 2022-10-19 NOTE — Telephone Encounter (Signed)
Xarelto refill please

## 2022-10-21 ENCOUNTER — Other Ambulatory Visit (HOSPITAL_BASED_OUTPATIENT_CLINIC_OR_DEPARTMENT_OTHER): Payer: Self-pay

## 2022-10-21 DIAGNOSIS — Z23 Encounter for immunization: Secondary | ICD-10-CM | POA: Diagnosis not present

## 2022-10-21 MED ORDER — COMIRNATY 30 MCG/0.3ML IM SUSY
PREFILLED_SYRINGE | INTRAMUSCULAR | 0 refills | Status: DC
  Filled 2022-10-21: qty 0.3, 1d supply, fill #0

## 2022-10-21 MED ORDER — FLUAD 0.5 ML IM SUSY
PREFILLED_SYRINGE | INTRAMUSCULAR | 0 refills | Status: DC
  Filled 2022-10-21: qty 0.5, 1d supply, fill #0

## 2022-11-02 ENCOUNTER — Other Ambulatory Visit (HOSPITAL_BASED_OUTPATIENT_CLINIC_OR_DEPARTMENT_OTHER): Payer: Self-pay | Admitting: Cardiology

## 2022-11-02 ENCOUNTER — Other Ambulatory Visit (HOSPITAL_BASED_OUTPATIENT_CLINIC_OR_DEPARTMENT_OTHER): Payer: Self-pay

## 2022-11-02 DIAGNOSIS — E7800 Pure hypercholesterolemia, unspecified: Secondary | ICD-10-CM

## 2022-11-02 MED ORDER — ROSUVASTATIN CALCIUM 5 MG PO TABS
5.0000 mg | ORAL_TABLET | ORAL | 11 refills | Status: DC
  Filled 2022-11-02: qty 16, 28d supply, fill #0
  Filled 2022-12-02: qty 16, 28d supply, fill #1
  Filled 2022-12-30: qty 16, 28d supply, fill #2
  Filled 2023-01-27: qty 16, 28d supply, fill #3
  Filled 2023-02-20: qty 16, 28d supply, fill #4
  Filled 2023-03-21: qty 16, 28d supply, fill #5
  Filled 2023-04-16 (×3): qty 16, 28d supply, fill #6
  Filled 2023-05-12: qty 16, 28d supply, fill #7
  Filled 2023-06-09: qty 16, 28d supply, fill #8
  Filled 2023-07-11: qty 16, 28d supply, fill #9
  Filled 2023-08-04: qty 16, 28d supply, fill #10
  Filled 2023-09-01: qty 16, 28d supply, fill #11

## 2022-11-03 ENCOUNTER — Other Ambulatory Visit (HOSPITAL_BASED_OUTPATIENT_CLINIC_OR_DEPARTMENT_OTHER): Payer: Self-pay

## 2022-11-18 ENCOUNTER — Other Ambulatory Visit (HOSPITAL_BASED_OUTPATIENT_CLINIC_OR_DEPARTMENT_OTHER): Payer: Self-pay

## 2022-11-21 NOTE — Therapy (Signed)
OUTPATIENT PHYSICAL THERAPY THORACOLUMBAR EVALUATION   Patient Name: Jennifer Mcclain MRN: [id number] DOB:July 19, 1945, 77 y.o., female Today's Date: 11/23/2022  END OF SESSION:  PT End of Session - 11/22/22 1426     Visit Number 1    Number of Visits 3    Date for PT Re-Evaluation 01/03/23    Authorization Type MCR A & B    PT Start Time 1321    PT Stop Time 1414    PT Time Calculation (min) 53 min    Activity Tolerance Patient tolerated treatment well    Behavior During Therapy WFL for tasks assessed/performed             Past Medical History:  Diagnosis Date   Achilles tendinitis    Per [facility] New Patient packet    Atrial fibrillation [facility])    Per records from [facility] in [place], [place]    Baker's cyst of knee, left    with resulting lower extremity edema   Benign cyst of breast, left    Per records from [facility] in [place], [place]. 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 Per records from [facility] in [place], [place]    H/O calculus of kidney during pregnancy    Lynden Ang, MD   H/O hysterectomy for benign disease    Lynden Ang, MD   History of fibula fracture    Right, Per records from [facility] in [place], [place]    History of torn meniscus of right knee    Per records from [facility] in [place], [place]    Humerus fracture    left leg- due to  fall   Humerus fracture    Resulting from a fall, Per records from [facility] in [place], [place]    Hypercalcemia    Per records from [facility] in [place], [place]    Hypertension    Per [facility] New Patient packet    Kidney stone on left side    Per records from [facility] in [place], [place]    Lactose intolerance    Per [facility] New Patient packet    Low back pain 03/24/2022   Multiple actinic keratoses    Lynden Ang, MD   Myalgia due to statin    Per records from [facility] in [place], [place]. Due to crestor    Non-toxic multinodular goiter 01/21/2017   Lynden Ang, MD   Normocytic anemia    Lynden Ang, MD   Obstructive sleep apnea syndrome    Per records from [facility] in [place], [place]. Patient did not tolerate CPAP machine    Onychomycosis    Per records from [facility] in [place], [place]    Osteoarthritis    Per [facility] New Patient packet  Osteopenia    Per records from [facility] in [place], [place]. Treated in the past with antiresorptive therapy   Renal cyst 10/11/2012   Simple on CT Scan 10/2012. Per records from [facility] in [place], [place]    Sensorineural hearing loss (SNHL), bilateral 01/28/2017   Lynden Ang , MD   Umbilical hernia    Per [facility] New Patient packet    Vitamin D deficiency    Lynden Ang, MD   Past Surgical History:  Procedure Laterality Date   ABDOMINAL HYSTERECTOMY  1989   One ovary, Per [facility] New Patient packet    ABLATION  1996   Radical, Per [facility] New Patient packet    BREAST CYST ASPIRATION Bilateral    numerous, over several years per pt, always benign   COLONOSCOPY  2019   Dr.Melissa Fayrene Fearing, Per [facility] New Patient packet    KNEE ARTHROSCOPY W/ MENISCAL REPAIR  2002   Per [facility] New Patient packet    MOUTH SURGERY     Dental Implant    REPLACEMENT TOTAL KNEE Left 2015   Per [facility] New Patient packet   RIGHT/LEFT HEART CATH AND CORONARY ANGIOGRAPHY N/A 02/07/2018    Procedure: RIGHT/LEFT HEART CATH AND CORONARY ANGIOGRAPHY;  Surgeon: Swazil[place], Peter M, MD;  Location: [facility] INVASIVE CV LAB;  Service: Cardiovascular;  Laterality: N/A;   TONSILLECTOMY  1951   Per [facility] New Patient packet    TOOTH EXTRACTION  01/12/2019   TOOTH EXTRACTION  06/05/2019   Preparing for dental implant    Patient Active Problem List   Diagnosis Date Noted   Left wrist pain 10/03/2022   Brittle nails 10/03/2022   Chronic pain of right knee 10/03/2022   Mid back pain 03/24/2022   Skin growth 03/24/2022   Wellness examination 02/23/2021   Skin infection 02/23/2021   Morbid obesity (HCC) 02/23/2021   Anticoagulant long-term use 05/04/2019   Chronic combined systolic and diastolic heart failure (HCC) 05/04/2019   Positive 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 09/15/2018   Pure hypercholesterolemia 06/14/2018   Coronary artery disease due to lipid rich plaque 03/02/2018   Systolic dysfunction without heart failure 02/02/2018   Nonischemic cardiomyopathy (HCC) 02/02/2018   Nonrheumatic mitral valve regurgitation 02/02/2018   Nonrheumatic tricuspid valve regurgitation 02/02/2018   Osteopenia 02/22/2017   Bilateral sensorineural hearing loss 01/28/2017   Vitamin D deficiency 01/21/2017   Osteoarthritis of knee 01/21/2017   Non-toxic multinodular goiter 01/21/2017   IFG (impaired fasting glucose) 01/21/2017   Lactose intolerance 01/21/2017   History of hysterectomy for benign disease 01/21/2017   Multiple actinic keratoses 01/21/2017   Normocytic anemia 01/21/2017   Umbilical hernia 01/21/2017   Primary hypertension 11/30/2016   Dyslipidemia 11/30/2016   Chronic atrial fibrillation (HCC) 11/30/2016   Obstructive sleep apnea syndrome 11/30/2016   Renal cyst 10/11/2012    PCP: Alyson Reedy, FNP  REFERRING PROVIDER: Erick Colace, MD  REFERRING DIAG: M54.50,G89.29 (ICD-10-CM) - Chronic bilateral low back pain, unspecified whether sciatica present  Rationale for Evaluation  and Treatment: Rehabilitation  THERAPY DIAG:  Other low back pain  ONSET DATE: PT order 10/15/22  SUBJECTIVE:  SUBJECTIVE STATEMENT: Pt states she began having back pain when she was about 35 when she tore a muscle in her lumbar.  Pt has not had any PT for lumbar.  Pt denies any specific exacerbation of lumbar pain/sx's.  Pt informed her PCP about lumbar pain and wanted to make sure it wasn't any organ pain.      Pt was referred by PCP to PM&R.  She saw MD on 10/15/22.  MD note indicated possible lumbar facet arthropathy and ordered x rays.  MD note indicated possible achilles tendinosis.  MD ordered PT and indicated pt to benefit from core stabilization exercises.  MD instructed pt to continue with aquatic exercises.  Pt has pain with cleaning including cleaning behind her toilet.  Pt has some pain when lying in bed, but pain doesn't wake her up.  Pt is limited with standing duration.  Pt has to sit down after 20 mins of standing.    Pt received PT for achilles pain and knee pain in 2022/2023 and had land and aquatic therapy.  Pt performs aquatic exercises 3x/wk at [employer].  Pt states her achilles pain bothers her more than her back.  Pt is limits her ambulation due to achilles pain.     PERTINENT HISTORY:  Bilat achilles pain and tendinitis Chronic R knee pain, R knee arthroscopy in 2002 L TKR Chronic A-fib, nonischemic cardiomyopathy, HTN L humeral fx now with arthritis Osteopenia R fibular fx in 2008/2009     PAIN:  NPRS:  0/10 current and best, 5/10 worst Location:  L sided lumbar pain Type:  aching.  Pt denies any radicular sx's into LE's. Easing factors: aquatic exercises, tylenol Aggravating factors:  standing  PRECAUTIONS: Other: osteopenia, L TKR, chronic A-fib    WEIGHT BEARING  RESTRICTIONS: No  FALLS:  Has patient fallen in last 6 months? No  LIVING ENVIRONMENT: Lives with: lives with their spouse Lives in: condo Stairs: 1 level with stairs to attic   OCCUPATION: Pt is retired  PLOF: Independent  PATIENT GOALS: improves strength, to be able to stand longer   OBJECTIVE:  Note: Objective measures were completed at Evaluation unless otherwise noted.  DIAGNOSTIC FINDINGS:  MD ordered x rays, but pt hasn't had them.    PATIENT SURVEYS:  FOTO 55 with a goal of 62 at visit 10   COGNITION: Overall cognitive status: Within functional limits for tasks assessed     PALPATION: TTP: bilat lumbar paraspinals.  Pt has soft tissue tightness in L sided lumbar paraspinals.   LUMBAR ROM:   AROM eval  Flexion WFL  Extension WFL with pain on L side  Right lateral flexion 75%  Left lateral flexion 75%  Right rotation WFL  Left rotation WFL   (Blank rows = not tested)   LOWER EXTREMITY MMT:    MMT Right eval Left eval  Hip flexion 5/5 5/5  Hip extension    Hip abduction    Hip adduction    Hip internal rotation    Hip external rotation 5/5 5/5  Knee flexion 5/5 seated 5/5 seated   Knee extension 5/5 5/5  Ankle dorsiflexion 5/5 5/5  Ankle plantarflexion WFL seated WFL seated  Ankle inversion    Ankle eversion     (Blank rows = not tested)  LUMBAR SPECIAL TESTS:  Supine SLR test:  negative bialt    TODAY'S TREATMENT:  PT educated pt in palpation and performance of TrA contraction.  Pt performed TrA contraction with 5 sec hold.  Pt performed PPT x10 reps.  Pt received a HEP handout and was educated in correct form and appropriate frequency.  PT instructed pt she should not have pain with HEP.   PATIENT EDUCATION:  Education details: dx, relevant anatomy, HEP, POC, objective findings, and rationale of  interventions. Person educated: Patient Education method: Explanation, Demonstration, Tactile cues, Verbal cues, and Handouts Education comprehension: verbalized understanding, returned demonstration, verbal cues required, and tactile cues required  HOME EXERCISE PROGRAM: Access Code: [email or web address] URL: [email or web address] Date: 11/22/2022 Prepared by: Aaron Edelman  Exercises - Supine Transversus Abdominis Bracing - Hands on Stomach  - 2 x daily - 7 x weekly - 2 sets - 10 reps - 5 seconds hold - Supine Posterior Pelvic Tilt  - 2 x daily - 7 x weekly - 2 sets - 10 reps  ASSESSMENT:  CLINICAL IMPRESSION: Patient is a 77 y.o. female with a dx of chronic bilat lumbar pain.  Pt has a hx of lumbar pain and denies any recent exacerbation.  Pt has bilat achilles pain and states her achilles bothers her more than her back.  Pt received prior PT for achilles and knee pain.  She continues with her aquatic exercises performing them 3x/wk.   Pt limits her ambulation due to achilles pain.  She is limited with standing duration and has lumbar pain with cleaning including cleaning behind her toilet.  Pt has some pain when lying in bed, but pain doesn't wake her up.  Pt had good LE strength with the selected motions that PT tested.   Pt has soft tissue tightness in L sided lumbar paraspinals.  Pt states she may not need any further PT.  Pt to perform HEP for the next 3 weeks and see how she responds.  Pt will call and cancel appt if she doesn't need any further PT.  She may benefit from skilled PT services to improve pain, core strength, and function.   OBJECTIVE IMPAIRMENTS: decreased activity tolerance, decreased mobility, difficulty walking, decreased strength, increased fascial restrictions, and pain.   ACTIVITY LIMITATIONS: standing  PARTICIPATION LIMITATIONS: cleaning and community activity  PERSONAL FACTORS: 3+ comorbidities: bilat achilles pain, chronic R knee pain, L TKR, chronic A-fib,  nonischemic cardiomyopathy  are also affecting patient's functional outcome.   REHAB POTENTIAL: Good  CLINICAL DECISION MAKING: Stable/uncomplicated  EVALUATION COMPLEXITY: Low   GOALS:   SHORT TERM GOALS: Target date: 12/13/22   Pt will report compliance with HEP for improved core strength and pain.   Baseline: Goal status: INITIAL   LONG TERM GOALS: Target date: 01/03/23  Pt will tolerated progression of core exercises without adverse effects for improved core strength, pain, and function.   Baseline:  Goal status: INITIAL  2.  Pt report at least a 70% improvement in lumbar pain and sx's for improved performance of household chores and mobility.   Baseline:  Goal status: INITIAL  3.  Pt will be independent and compliant with HEP for improved core strength, pain, and function.   Baseline:  Goal status: INITIAL    PLAN:  PT FREQUENCY: 1-3 visits  PT DURATION: 6 weeks  PLANNED INTERVENTIONS: [id number]- PT Re-evaluation, 97110-Therapeutic exercises, 97530- Therapeutic activity, [id number]- Neuromuscular re-education, 97535- Self Care, [id number]- Manual therapy, [id number]- Gait training, [phone]- Aquatic Therapy, [phone]- Ultrasound, Patient/Family education, Stair training, Taping, Dry Needling, Cryotherapy, and Moist heat.  PLAN FOR NEXT SESSION: Pt will  schedule 1 visits 3 weeks from now and will call to cancel if she is feeling fine and doesn't need further PT.  Progress HEP for core strength.   STM to lumbar paraspinals.   Audie Clear III PT, DPT 11/24/22 10:58 AM

## 2022-11-22 ENCOUNTER — Other Ambulatory Visit (HOSPITAL_BASED_OUTPATIENT_CLINIC_OR_DEPARTMENT_OTHER): Payer: Self-pay

## 2022-11-22 ENCOUNTER — Encounter (HOSPITAL_BASED_OUTPATIENT_CLINIC_OR_DEPARTMENT_OTHER): Payer: Self-pay | Admitting: Physical Therapy

## 2022-11-22 ENCOUNTER — Ambulatory Visit (HOSPITAL_BASED_OUTPATIENT_CLINIC_OR_DEPARTMENT_OTHER): Payer: Medicare Other | Attending: Physical Medicine & Rehabilitation | Admitting: Physical Therapy

## 2022-11-22 ENCOUNTER — Other Ambulatory Visit: Payer: Self-pay

## 2022-11-22 ENCOUNTER — Other Ambulatory Visit (HOSPITAL_BASED_OUTPATIENT_CLINIC_OR_DEPARTMENT_OTHER): Payer: Self-pay | Admitting: Cardiology

## 2022-11-22 DIAGNOSIS — I428 Other cardiomyopathies: Secondary | ICD-10-CM

## 2022-11-22 DIAGNOSIS — M545 Low back pain, unspecified: Secondary | ICD-10-CM | POA: Diagnosis present

## 2022-11-22 DIAGNOSIS — M5459 Other low back pain: Secondary | ICD-10-CM | POA: Insufficient documentation

## 2022-11-22 DIAGNOSIS — R262 Difficulty in walking, not elsewhere classified: Secondary | ICD-10-CM | POA: Insufficient documentation

## 2022-11-22 DIAGNOSIS — Z7409 Other reduced mobility: Secondary | ICD-10-CM | POA: Insufficient documentation

## 2022-11-22 DIAGNOSIS — G8929 Other chronic pain: Secondary | ICD-10-CM | POA: Insufficient documentation

## 2022-11-22 DIAGNOSIS — I482 Chronic atrial fibrillation, unspecified: Secondary | ICD-10-CM

## 2022-11-22 MED ORDER — METOPROLOL SUCCINATE ER 50 MG PO TB24
ORAL_TABLET | ORAL | 3 refills | Status: DC
  Filled 2022-11-22: qty 270, 90d supply, fill #0
  Filled 2023-02-22: qty 270, 90d supply, fill #1
  Filled 2023-05-26: qty 270, 90d supply, fill #2
  Filled 2023-08-19: qty 270, 90d supply, fill #3

## 2022-11-26 ENCOUNTER — Encounter: Payer: Medicare Other | Admitting: Physical Medicine & Rehabilitation

## 2022-12-02 ENCOUNTER — Other Ambulatory Visit (HOSPITAL_BASED_OUTPATIENT_CLINIC_OR_DEPARTMENT_OTHER): Payer: Self-pay

## 2022-12-02 ENCOUNTER — Encounter (HOSPITAL_BASED_OUTPATIENT_CLINIC_OR_DEPARTMENT_OTHER): Payer: Self-pay | Admitting: Family Medicine

## 2022-12-03 ENCOUNTER — Encounter (HOSPITAL_BASED_OUTPATIENT_CLINIC_OR_DEPARTMENT_OTHER): Payer: Self-pay | Admitting: Family

## 2022-12-03 ENCOUNTER — Ambulatory Visit (HOSPITAL_BASED_OUTPATIENT_CLINIC_OR_DEPARTMENT_OTHER): Payer: 59 | Admitting: Cardiology

## 2022-12-03 ENCOUNTER — Ambulatory Visit (HOSPITAL_BASED_OUTPATIENT_CLINIC_OR_DEPARTMENT_OTHER): Payer: Medicare Other | Admitting: Family

## 2022-12-03 VITALS — BP 124/86 | HR 63 | Ht 62.0 in | Wt 201.1 lb

## 2022-12-03 DIAGNOSIS — I4821 Permanent atrial fibrillation: Secondary | ICD-10-CM | POA: Diagnosis not present

## 2022-12-03 DIAGNOSIS — E785 Hyperlipidemia, unspecified: Secondary | ICD-10-CM

## 2022-12-03 DIAGNOSIS — I428 Other cardiomyopathies: Secondary | ICD-10-CM

## 2022-12-03 DIAGNOSIS — D6859 Other primary thrombophilia: Secondary | ICD-10-CM

## 2022-12-03 DIAGNOSIS — I25118 Atherosclerotic heart disease of native coronary artery with other forms of angina pectoris: Secondary | ICD-10-CM | POA: Diagnosis not present

## 2022-12-03 LAB — CBC
Hematocrit: 41 % (ref 34.0–46.6)
Hemoglobin: 13.4 g/dL (ref 11.1–15.9)
MCH: 32.2 pg (ref 26.6–33.0)
MCHC: 32.7 g/dL (ref 31.5–35.7)
MCV: 99 fL — ABNORMAL HIGH (ref 79–97)
Platelets: 170 10*3/uL (ref 150–450)
RBC: 4.16 x10E6/uL (ref 3.77–5.28)
RDW: 12.5 % (ref 11.7–15.4)
WBC: 4.8 10*3/uL (ref 3.4–10.8)

## 2022-12-03 NOTE — Progress Notes (Signed)
Cardiology Office Note:  .   Date:  12/03/2022  ID:  Jennifer Mcclain, DOB 1946-01-08, MRN [id number] PCP: Alyson Reedy, FNP  [facility] Providers Cardiologist:  [name], MD    History of Present Illness: .   Jennifer Mcclain is a 77 y.o. female with a history of permanent atrial fibrillation on DOAC, nonischemic cardiomyopathy covered LVEF, hyperlipidemia, hypertension, minimal nonobstructive CAD.  Initially established Dr. [name] January 2020 for atrial fibrillation.  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  Moved to [place] approximately 10 years ago and was told no evidence of WPW.  Her daughter passed from heart disease with a "kink "in one of her arteries at age 35.  Echo 01/2018 LVEF 35-40%, moderate MR, moderate to severe TR, mild PR. Cardiac catheterization 01/2020 with minimal nonobstructive CAD.  Most recent echo 05/2018 LVEF 55 to 60%, mild to moderate MR. Monitorx2 with 100% atrial fibrillation which is now permanent based on preference for rate control only.  Last seen 06/01/2022 doing overall well from a cardiac perspective with NYHA II symptoms.  Entresto, metoprolol succinate, rosuvastatin 4 times per week were continued.  Presents today for follow up overall feeling well. Exercising in [place] at the pool which is easier than alternate exercise due to orthopedic limitations. Some exertional dyspnea which she attributes to her atrial fibrillation. This is unchanged from previous. No orthopnea, PND, edema, chest pain, chest pain.   ROS: Please see the history of present illness.    All other systems reviewed and are negative.   Studies Reviewed: .        Cardiac Studies & Procedures   CARDIAC CATHETERIZATION  CARDIAC CATHETERIZATION 02/07/2018  Narrative  Prox LAD lesion is 20% stenosed.  LV end diastolic  pressure is mildly elevated.  There is moderate left ventricular systolic dysfunction.  Hemodynamic findings consistent with mild pulmonary hypertension.  1. Minimal nonobstructive CAD with calcified plaque in the proximal LAD 2. Moderate LV dysfunction. EF estimated at 40-45%. 3. Mildly elevated LV filling pressures. V waves prominent on PCWP tracing 4. Mild pulmonary HTN 5. Low cardiac output. Index 2.06. 6. Mild to Moderate MR.  Plan: Optimize medical therapy for CHF. Consider repeat Echo once medical therapy optimized to reassess valve regurgitation.  Findings Coronary Findings Diagnostic  Dominance: Right  Left Main Vessel was injected. Vessel is normal in caliber. Vessel is angiographically normal.  Left Anterior Descending Prox LAD lesion is 20% stenosed. The lesion is moderately calcified.  Left Circumflex Vessel was injected. Vessel is normal in caliber. Vessel is angiographically normal.  Right Coronary Artery Vessel was injected. Vessel is normal in caliber. Vessel is angiographically normal.  Intervention  No interventions have been documented.     ECHOCARDIOGRAM  ECHOCARDIOGRAM COMPLETE 05/24/2018  Narrative ECHOCARDIOGRAM REPORT    Patient Name:   Jennifer Mcclain Date of Exam: 05/24/2018 Medical Rec #:  [id number]          Height:       62.0 in Accession #:    [id number]         Weight:       192.4 lb Date of Birth:  Feb 05, 1945          BSA:          1.88 m Patient Age:    53 years  BP:           148/86 mmHg Patient Gender: F                  HR:           70 bpm. Exam Location:  [address]   Procedure: 2D Echo, Cardiac Doppler and Color Doppler  Indications:    I42.9 Cardiomyopathy  History:        Patient has prior history of Echocardiogram examinations, most recent 01/25/2018. Risk Factors: Hypertension. Atrial Fibrillation. Obstructive sleep apnea.  Sonographer:    Daphine Deutscher RDCS Referring Phys: [id number] BRIDGETTE  CHRISTOPHER  IMPRESSIONS   1. The left ventricle has normal systolic function, with an ejection fraction of 55-60%. The cavity size was normal. Left ventricular diastolic Doppler parameters are indeterminate secondary to atrial fibrillation. 2. The right ventricle has normal systolic function. The cavity was normal. There is no increase in right ventricular wall thickness. 3. Left atrial size was severely dilated. 4. Mitral valve regurgitation is mild to moderate by color flow Doppler. 5. The aortic valve is tricuspid. No stenosis of the aortic valve.  FINDINGS Left Ventricle: The left ventricle has normal systolic function, with an ejection fraction of 55-60%. The cavity size was normal. There is no increase in left ventricular wall thickness. Left ventricular diastolic Doppler parameters are indeterminate secondary to atrial fibrillation.  Right Ventricle: The right ventricle has normal systolic function. The cavity was normal. There is no increase in right ventricular wall thickness.  Left Atrium: Left atrial size was severely dilated.  Right Atrium: Right atrial size was normal in size. Right atrial pressure is estimated at 10 mmHg.  Interatrial Septum: No atrial level shunt detected by color flow Doppler.  Pericardium: There is no evidence of pericardial effusion.  Mitral Valve: The mitral valve is normal in structure. Mitral valve regurgitation is mild to moderate by color flow Doppler.  Tricuspid Valve: The tricuspid valve is normal in structure. Tricuspid valve regurgitation is mild by color flow Doppler.  Aortic Valve: The aortic valve is tricuspid Aortic valve regurgitation was not visualized by color flow Doppler. There is No stenosis of the aortic valve.  Pulmonic Valve: The pulmonic valve was normal in structure. Pulmonic valve regurgitation was not assessed by color flow Doppler.  Venous: The inferior vena cava is normal in size with greater than 50% respiratory  variability.   +--------------+--------++ LEFT VENTRICLE         +----------------+---------++ +--------------+--------++ Diastology                PLAX 2D                +----------------+---------++ +--------------+--------++ LV e' lateral:  9.87 cm/s LVIDd:        5.10 cm  +----------------+---------++ +--------------+--------++ LV E/e' lateral:10.4      LVIDs:        3.60 cm  +----------------+---------++ +--------------+--------++ LV e' medial:   8.85 cm/s LV PW:        1.00 cm  +----------------+---------++ +--------------+--------++ LV E/e' medial: 11.7      LV IVS:       0.80 cm  +----------------+---------++ +--------------+--------++ LVOT diam:    1.90 cm  +--------------+--------++ LV SV:        69 ml    +--------------+--------++ LV SV Index:  34.79    +--------------+--------++ LVOT Area:    2.84 cm +--------------+--------++                        +--------------+--------++  +---------------+---------++  RIGHT VENTRICLE          +---------------+---------++ RV Basal diam: 3.30 cm   +---------------+---------++ RVSP:          25.2 mmHg +---------------+---------++  +---------------+--------++-----------++ LEFT ATRIUM            Index       +---------------+--------++-----------++ LA diam:       5.20 cm 2.77 cm/m  +---------------+--------++-----------++ LA Vol (A2C):  112.0 ml59.56 ml/m +---------------+--------++-----------++ LA Vol (A4C):  89.5 ml 47.59 ml/m +---------------+--------++-----------++ LA Biplane Vol:101.0 ml53.71 ml/m +---------------+--------++-----------++ +------------+---------++-----------++ RIGHT ATRIUM         Index       +------------+---------++-----------++ RA Pressure:3.00 mmHg            +------------+---------++-----------++ RA Area:    17.80 cm             +------------+---------++-----------++ RA Volume:  54.10 ml 28.77 ml/m +------------+---------++-----------++ +------------+-----------++ AORTIC VALVE            +------------+-----------++ LVOT Vmax:  61.65 cm/s  +------------+-----------++ LVOT Vmean: 45.050 cm/s +------------+-----------++ LVOT VTI:   0.134 m     +------------+-----------++  +-------------+-------++ AORTA                +-------------+-------++ Ao Root diam:2.70 cm +-------------+-------++  +--------------+-----------++ +---------------+-----------++ MV E velocity:103.07 cm/s TRICUSPID VALVE            +--------------+-----------++ +---------------+-----------++ TR Peak grad:  22.2 mmHg   +---------------+-----------++ TR Vmax:       261.00 cm/s +---------------+-----------++ Estimated RAP: 3.00 mmHg   +---------------+-----------++ RVSP:          25.2 mmHg   +---------------+-----------++  +--------------+-------+ SHUNTS                +--------------+-------+ Systemic VTI: 0.13 m  +--------------+-------+ Systemic Diam:1.90 cm +--------------+-------+   Chilton Si MD Electronically signed by Chilton Si MD Signature Date/Time: 05/24/2018/7:12:54 PM    Final    MONITORS  LONG TERM MONITOR (3-14 DAYS) 01/18/2019  Narrative 7 days of data recorded on Zio monitor. Patient had a min HR of 46 bpm, max HR of 111 bpm, and avg HR of 68 bpm. Patient was in atrial fibrillation 100% of the time. No VT, high degree block, or pauses noted. Isolated ventricular ectopy was rare (<1%). There were 0 triggered events.           Risk Assessment/Calculations:    CHA2DS2-VASc Score = 6   This indicates a 9.7% annual risk of stroke. The patient's score is based upon: CHF History: 1 HTN History: 1 Diabetes History: 0 Stroke History: 0 Vascular Disease History: 1 Age Score: 2 Gender Score: 1            Physical Exam:   VS:   BP 124/86 (BP Location: Left Arm)   Pulse 63   Ht 5\' 2"  (1.575 m)   Wt 201 lb 1.6 oz (91.2 kg)   SpO2 94%   BMI 36.78 kg/m    Wt Readings from Last 3 Encounters:  12/03/22 201 lb 1.6 oz (91.2 kg)  10/15/22 199 lb (90.3 kg)  09/29/22 198 lb (89.8 kg)    GEN: Well nourished, well developed in no acute distress NECK: No JVD; No carotid bruits CARDIAC: IRIR, no murmurs, rubs, gallops RESPIRATORY:  Clear to auscultation without rales, wheezing or rhonchi  ABDOMEN: Soft, non-tender, non-distended EXTREMITIES:  No edema; No deformity   ASSESSMENT AND PLAN: .    NICM with recovered LVEF - LHC nonobstructive CAD. Euvolemic and  well compensated on exam. GDMT Entresto, Metoprolol. No indication for loop diuretic.  Permanent atrial fibrillation / Hypercoagulable state - Rate controlled on Toprol 100mg  AM and 50mg  PM. CHA2DS2-VASc Score = 6 [CHF History: 1, HTN History: 1, Diabetes History: 0, Stroke History: 0, Vascular Disease History: 1, Age Score: 2, Gender Score: 1].  Therefore, the patient's annual risk of stroke is 9.7 %.    Continue Xarelto 20mg  daily. Update CBC for monitoring.  HTN - BP well controlled. Continue current antihypertensive regimen.  Discussed to monitor BP at home at least 2 hours after medications and sitting for 5-10 minutes.  Nonobstructive CAD / HLD, LDL goal <70 - Stable with no anginal symptoms. No indication for ischemic evaluation.  GDMT Metoprolol, Rosuvastatin 4x per week. No ASA due to [place]. 03/2022 LDL 84. Plan for lifestyle interventions and repeat labs at annual PCP visit 03/2022. If LDL not at goal <70, consider addition of Zetia.        Dispo: follow up in 6 months  Signed, Alver Sorrow, NP

## 2022-12-03 NOTE — Patient Instructions (Signed)
Medication Instructions:  Your physician recommends that you continue on your current medications as directed. Please refer to the Current Medication list given to you today.  *If you need a refill on your cardiac medications before your next appointment, please call your pharmacy*  Lab Work: CBC today  If you have labs (blood work) drawn today and your tests are completely normal, you will receive your results only by: MyChart Message (if you have MyChart) OR A paper copy in the mail If you have any lab test that is abnormal or we need to change your treatment, we will call you to review the results.   Follow-Up: At [facility], you and your health needs are our priority.  As part of our continuing mission to provide you with exceptional heart care, we have created designated Provider Care Teams.  These Care Teams include your primary Cardiologist (physician) and Advanced Practice Providers (APPs -  Physician Assistants and Nurse Practitioners) who all work together to provide you with the care you need, when you need it.  We recommend signing up for the patient portal called "MyChart".  Sign up information is provided on this After Visit Summary.  MyChart is used to connect with patients for Virtual Visits (Telemedicine).  Patients are able to view lab/test results, encounter notes, upcoming appointments, etc.  Non-urgent messages can be sent to your provider as well.   To learn more about what you can do with MyChart, go to [email or web address].    Your next appointment:   6 month(s)  Provider:   [name], MD or Gillian Shields, NP    Other Instructions  Ezetimibe Tablets What is this medication? EZETIMIBE (ez ET i mibe) treats high cholesterol. It works by reducing the amount of cholesterol absorbed from the food you eat. This decreases the amount of bad cholesterol (such as LDL) in your blood. Changes to diet and exercise are often combined with this  medication. This medicine may be used for other purposes; ask your health care provider or pharmacist if you have questions. COMMON BRAND NAME(S): Zetia What should I tell my care team before I take this medication? They need to know if you have any of these conditions: Kidney disease Liver disease Muscle cramps, pain Muscle injury Thyroid disease An unusual or allergic reaction to ezetimibe, other medications, foods, dyes, or preservatives Pregnant or trying to get pregnant Breast-feeding How should I use this medication? Take this medication by mouth. Take it as directed on the prescription label at the same time every day. You can take it with or without food. If it upsets your stomach, take it with food. Keep taking it unless your care team tells you to stop. Take bile acid sequestrants at a different time of day than this medication. Take this medication 2 hours BEFORE or 4 hours AFTER bile acid sequestrants. Talk to your care team about the use of this medication in children. While it may be prescribed for children as young as 10 for selected conditions, precautions do apply. Overdosage: If you think you have taken too much of this medicine contact a poison control center or emergency room at once. NOTE: This medicine is only for you. Do not share this medicine with others. What if I miss a dose? If you miss a dose, take it as soon as you can. If it is almost time for your next dose, take only that dose. Do not take double or extra doses. What may interact with this medication?  Do not take this medication with any of the following: Fenofibrate Gemfibrozil This medication may also interact with the following: Antacids Cyclosporine Herbal medications like red yeast rice Other medications to lower cholesterol or triglycerides This list may not describe all possible interactions. Give your health care provider a list of all the medicines, herbs, non-prescription drugs, or dietary  supplements you use. Also tell them if you smoke, drink alcohol, or use illegal drugs. Some items may interact with your medicine. What should I watch for while using this medication? Visit your care team for regular checks on your progress. Tell your care team if your symptoms do not start to get better or if they get worse. Your care team may tell you to stop taking this medication if you develop muscle problems. If your muscle problems do not go away after stopping this medication, contact your care team. Talk to your care team if you wish to become pregnant or think you might be pregnant. This medication can cause serious birth defects if taken during pregnancy. Talk to your care team before breastfeeding. Changes to your treatment plan may be needed. Taking this medication is only part of a total heart healthy program. Ask your care team if there are other changes you can make to improve your overall health. What side effects may I notice from receiving this medication? Side effects that you should report to your doctor or health care provider as soon as possible: Allergic reactions--skin rash, itching or hives, swelling of the face, lips, tongue, or throat Side effects that usually do not require medical attention (report to your doctor or health care provider if they continue or are bothersome): Diarrhea Joint pain This list may not describe all possible side effects. Call your doctor for medical advice about side effects. You may report side effects to FDA at 1-800-FDA-1088. Where should I keep my medication? Keep out of the reach of children and pets. Store at room temperature between 15 and 30 degrees C (59 and 86 degrees F). Protect from moisture. Get rid of any unused medication after the expiration date. NOTE: This sheet is a summary. It may not cover all possible information. If you have questions about this medicine, talk to your doctor, pharmacist, or health care provider.  2024  Elsevier/Gold Standard (2021-10-06 00:00:00)

## 2022-12-04 ENCOUNTER — Encounter (HOSPITAL_BASED_OUTPATIENT_CLINIC_OR_DEPARTMENT_OTHER): Payer: Self-pay

## 2022-12-14 ENCOUNTER — Other Ambulatory Visit (HOSPITAL_BASED_OUTPATIENT_CLINIC_OR_DEPARTMENT_OTHER): Payer: Self-pay

## 2022-12-14 ENCOUNTER — Other Ambulatory Visit (HOSPITAL_BASED_OUTPATIENT_CLINIC_OR_DEPARTMENT_OTHER): Payer: Self-pay | Admitting: Cardiology

## 2022-12-14 ENCOUNTER — Ambulatory Visit (HOSPITAL_BASED_OUTPATIENT_CLINIC_OR_DEPARTMENT_OTHER): Payer: Medicare Other | Attending: Physical Medicine & Rehabilitation | Admitting: Physical Therapy

## 2022-12-14 DIAGNOSIS — I428 Other cardiomyopathies: Secondary | ICD-10-CM

## 2022-12-14 DIAGNOSIS — M5459 Other low back pain: Secondary | ICD-10-CM | POA: Insufficient documentation

## 2022-12-14 MED ORDER — ENTRESTO 49-51 MG PO TABS
1.0000 | ORAL_TABLET | Freq: Two times a day (BID) | ORAL | 3 refills | Status: DC
  Filled 2022-12-14: qty 180, 90d supply, fill #0
  Filled 2022-12-15: qty 60, 30d supply, fill #0
  Filled 2023-01-10: qty 60, 30d supply, fill #1
  Filled 2023-02-08 (×2): qty 60, 30d supply, fill #2
  Filled 2023-03-09: qty 60, 30d supply, fill #3
  Filled 2023-04-06: qty 60, 30d supply, fill #4
  Filled 2023-05-04: qty 60, 30d supply, fill #5
  Filled 2023-06-03: qty 60, 30d supply, fill #6
  Filled 2023-07-03: qty 60, 30d supply, fill #7
  Filled 2023-08-02: qty 60, 30d supply, fill #8
  Filled 2023-08-29: qty 60, 30d supply, fill #9
  Filled 2023-09-28: qty 60, 30d supply, fill #10
  Filled 2023-10-27: qty 60, 30d supply, fill #11

## 2022-12-14 NOTE — Therapy (Signed)
OUTPATIENT PHYSICAL THERAPY TREATMENT / DISCHARGE   Patient Name: Jennifer Mcclain MRN: [id number] DOB:17-May-1945, 77 y.o., female Today's Date: 12/15/2022  END OF SESSION:  PT End of Session - 12/14/22 1414     Visit Number 2    Number of Visits 2    Authorization Type MCR A & B    PT Start Time 1410    PT Stop Time 1452    PT Time Calculation (min) 42 min    Activity Tolerance Patient tolerated treatment well    Behavior During Therapy WFL for tasks assessed/performed              Past Medical History:  Diagnosis Date   Achilles tendinitis    Per [facility] New Patient packet    Atrial fibrillation [facility])    Per records from [facility] in [place], [place]    Baker's cyst of knee, left    with resulting lower extremity edema   Benign cyst of breast, left    Per records from [facility] in [place], [place]. 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 Per records from [facility] in [place], [place]    H/O calculus of kidney during pregnancy    Lynden Ang, MD   H/O hysterectomy for benign disease    Lynden Ang, MD   History of fibula fracture    Right, Per records from [facility] in [place], [place]    History of torn meniscus of right knee    Per records from [facility] in [place], [place]    Humerus fracture    left leg- due to fall   Humerus fracture    Resulting  from a fall, Per records from [facility] in [place], [place]    Hypercalcemia    Per records from [facility] in [place], [place]    Hypertension    Per [facility] New Patient packet    Kidney stone on left side    Per records from [facility] in [place], [place]    Lactose intolerance    Per [facility] New Patient packet    Low back pain 03/24/2022   Multiple actinic keratoses    Lynden Ang, MD   Myalgia due to statin    Per records from [facility] in [place], [place]. Due to crestor    Non-toxic multinodular goiter 01/21/2017   Lynden Ang, MD   Normocytic anemia    Lynden Ang, MD   Obstructive sleep apnea syndrome    Per records from [facility] in [place], [place]. Patient did not tolerate CPAP machine    Onychomycosis    Per records from [facility] in [place], [place]    Osteoarthritis    Per [facility] New Patient packet    Osteopenia  Per records from [facility] in [place], [place]. Treated in the past with antiresorptive therapy   Renal cyst 10/11/2012   Simple on CT Scan 10/2012. Per records from [facility] in [place], [place]    Sensorineural hearing loss (SNHL), bilateral 01/28/2017   Lynden Ang , MD   Umbilical hernia    Per [facility] New Patient packet    Vitamin D deficiency    Lynden Ang, MD   Past Surgical History:  Procedure Laterality Date   ABDOMINAL HYSTERECTOMY  1989   One ovary, Per [facility] New Patient packet    ABLATION  1996   Radical, Per [facility] New Patient packet    BREAST CYST ASPIRATION Bilateral    numerous, over several years per pt, always benign   COLONOSCOPY  2019   Dr.Melissa Fayrene Fearing, Per [facility] New Patient packet    KNEE ARTHROSCOPY W/ MENISCAL REPAIR  2002   Per [facility] New Patient packet    MOUTH SURGERY     Dental Implant    REPLACEMENT TOTAL KNEE Left 2015   Per [facility] New Patient packet   RIGHT/LEFT HEART CATH AND CORONARY ANGIOGRAPHY N/A 02/07/2018   Procedure: RIGHT/LEFT HEART CATH AND  CORONARY ANGIOGRAPHY;  Surgeon: [place]az[place], Peter M, MD;  Location: [facility] INVASIVE CV LAB;  Service: Cardiovascular;  Laterality: N/A;   TONSILLECTOMY  1951   Per [facility] New Patient packet    TOOTH EXTRACTION  01/12/2019   TOOTH EXTRACTION  06/05/2019   Preparing for dental implant    Patient Active Problem List   Diagnosis Date Noted   Left wrist pain 10/03/2022   Brittle nails 10/03/2022   Chronic pain of right knee 10/03/2022   Mid back pain 03/24/2022   Skin growth 03/24/2022   Wellness examination 02/23/2021   Skin infection 02/23/2021   Morbid obesity (HCC) 02/23/2021   Anticoagulant long-term use 05/04/2019   Chronic combined systolic and diastolic heart failure (HCC) 05/04/2019   Positive 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 09/15/2018   Pure hypercholesterolemia 06/14/2018   Coronary artery disease due to lipid rich plaque 03/02/2018   Systolic dysfunction without heart failure 02/02/2018   Nonischemic cardiomyopathy (HCC) 02/02/2018   Nonrheumatic mitral valve regurgitation 02/02/2018   Nonrheumatic tricuspid valve regurgitation 02/02/2018   Osteopenia 02/22/2017   Bilateral sensorineural hearing loss 01/28/2017   Vitamin D deficiency 01/21/2017   Osteoarthritis of knee 01/21/2017   Non-toxic multinodular goiter 01/21/2017   IFG (impaired fasting glucose) 01/21/2017   Lactose intolerance 01/21/2017   History of hysterectomy for benign disease 01/21/2017   Multiple actinic keratoses 01/21/2017   Normocytic anemia 01/21/2017   Umbilical hernia 01/21/2017   Primary hypertension 11/30/2016   Dyslipidemia 11/30/2016   Chronic atrial fibrillation (HCC) 11/30/2016   Obstructive sleep apnea syndrome 11/30/2016   Renal cyst 10/11/2012    PCP: Alyson Reedy, FNP  REFERRING PROVIDER: Erick Colace, MD  REFERRING DIAG: M54.50,G89.29 (ICD-10-CM) - Chronic bilateral low back pain, unspecified whether sciatica present  Rationale for Evaluation and Treatment: Rehabilitation  THERAPY  DIAG:  Other low back pain  ONSET DATE: PT order 10/15/22  SUBJECTIVE:  SUBJECTIVE STATEMENT: Pt reports she has performed her HEP daily.  Pt performed a water fit class this AM.  Pt reports she could feel her back when she was twisting in the class.  She has been performing HEP.  Pt states she could feel some A-fib sx's when performing TrA contractions in supine.    PERTINENT HISTORY:  Bilat achilles pain and tendinitis Chronic R knee pain, R knee arthroscopy in 2002 L TKR Chronic A-fib, nonischemic cardiomyopathy, HTN L humeral fx now with arthritis Osteopenia R fibular fx in 2008/2009     PAIN:  NPRS:  0/10 current and best, 5/10 worst Location:  L sided lumbar pain Type:  aching.  Pt denies any radicular sx's into LE's. Easing factors: aquatic exercises, tylenol Aggravating factors:  standing  PRECAUTIONS: Other: osteopenia, L TKR, chronic A-fib    WEIGHT BEARING RESTRICTIONS: No  FALLS:  Has patient fallen in last 6 months? No  LIVING ENVIRONMENT: Lives with: lives with their spouse Lives in: condo Stairs: 1 level with stairs to attic   OCCUPATION: Pt is retired  PLOF: Independent  PATIENT GOALS: improves strength, to be able to stand longer   OBJECTIVE:  Note: Objective measures were completed at Evaluation unless otherwise noted.  DIAGNOSTIC FINDINGS:  MD ordered x rays, but pt hasn't had them.      TODAY'S TREATMENT:                                                                                                                               PT educated pt in palpation and performance of TrA contraction.      Reviewed and performed HEP. Seated TrA contractions with 5 sec hold x 10 reps and supine TrA contraction with 5 sec hold Supine marching with TrA approx 10  reps and with PPT 2x10 reps Supine heel slides with PPT Seated clams with PPT 2x10 Supine SLR with PPT 1x10 and 1x6-8 reps  Standing rows with TrA 2x10 with RTB   Updated HEP.  Removed supine TrA contraction with 5 sec hold from HEP.  Pt received a HEP handout and was educated in correct form and appropriate frequency.  PT instructed pt she should not have pain or any A-fib sx's with HEP.    See below for pt education  PATIENT EDUCATION:  Education details: dx, relevant anatomy, HEP, POC/discharge planning, and rationale of interventions. Person educated: Patient Education method: Explanation, Demonstration, Tactile cues, Verbal cues, and Handouts Education comprehension: verbalized understanding, returned demonstration, verbal cues required, and tactile cues required  HOME EXERCISE PROGRAM: Access Code: [email or web address] URL: [email or web address] Date: 11/22/2022 Prepared by: Aaron Edelman  Exercises - Supine Posterior Pelvic Tilt  - 2 x daily - 7 x weekly - 2 sets - 10 reps Updated HEP: - Seated Transversus Abdominis Bracing  - 2 x daily - 7 x weekly - 2 sets - 10 reps - 5 seconds hold - Supine March  - 1 x  daily - 7 x weekly - 2 sets - 10 reps - Seated Hip Abduction with Resistance  - 1 x daily - 4 x weekly - 2 sets - 10 reps - Standing Shoulder Row with Anchored Resistance  - 1 x daily - 4-5 x weekly - 2 sets - 10 reps  ASSESSMENT:  CLINICAL IMPRESSION: PT worked on [employer] today.  PT reviewed HEP and updated HEP.  Pt has chronic A-fib.  Pt states she could feel some A-fib sx's when performing TrA contractions in supine.  PT instructed pt in performing seated TrA contractions and pt performed without any A-fib sx's.  PT educated pt in performing seated TrA contractions at home instead of supine.  Pt doesn't have A-fib sx's with supine PPT or supine marching with PPT.  PT instructed pt to perform supine marching with PPT at home not with TrA contraction.  Pt reports having knee  pain with supine heel slides and PT did not include in HEP.  Pt performed HEP well.  Pt received a HEP handout and demonstrates good understanding.  Pt has met STG #1 and is progressing toward LTG's #1,3.  Pt responded well to Rx and had no c/o's and no  pain after Rx.  Pt states she is ready for discharge.       OBJECTIVE IMPAIRMENTS: decreased activity tolerance, decreased mobility, difficulty walking, decreased strength, increased fascial restrictions, and pain.   ACTIVITY LIMITATIONS: standing  PARTICIPATION LIMITATIONS: cleaning and community activity  PERSONAL FACTORS: 3+ comorbidities: bilat achilles pain, chronic R knee pain, L TKR, chronic A-fib, nonischemic cardiomyopathy  are also affecting patient's functional outcome.   REHAB POTENTIAL: Good  CLINICAL DECISION MAKING: Stable/uncomplicated  EVALUATION COMPLEXITY: Low   GOALS:   SHORT TERM GOALS: Target date: 12/13/22   Pt will report compliance with HEP for improved core strength and pain.   Baseline: Goal status: GOAL MET 12/3   LONG TERM GOALS: Target date: 01/03/23  Pt will tolerated progression of core exercises without adverse effects for improved core strength, pain, and function.   Baseline:  Goal status: PROGRESSING  12/3  2.  Pt report at least a 70% improvement in lumbar pain and sx's for improved performance of household chores and mobility.   Baseline:  Goal status: NOT ASSESSED  3.  Pt will be independent and compliant with HEP for improved core strength, pain, and function.   Baseline:  Goal status: PROGRESSING 12/3    PLAN:  PT FREQUENCY:  PT DURATION:   PLANNED INTERVENTIONS: 97164- PT Re-evaluation, 97110-Therapeutic exercises, 97530- Therapeutic activity, [id number]- Neuromuscular re-education, 97535- Self Care, 45409- Manual therapy, [id number]- Gait training, [phone]- Aquatic Therapy, [phone]- Ultrasound, Patient/Family education, Stair training, Taping, Dry Needling, Cryotherapy, and Moist  heat.  PLAN FOR NEXT SESSION: Pt to be discharged from skilled PT services due to self discharge.  PT is in agreement with discharge.  She will cont with HEP.   PHYSICAL THERAPY DISCHARGE SUMMARY  Visits from Start of Care: 2  Current functional level related to goals / functional outcomes: See above   Remaining deficits: See above   Education / Equipment: HEP    Audie Clear III PT, DPT 12/15/22 10:39 PM

## 2022-12-15 ENCOUNTER — Other Ambulatory Visit (HOSPITAL_BASED_OUTPATIENT_CLINIC_OR_DEPARTMENT_OTHER): Payer: Self-pay

## 2022-12-15 ENCOUNTER — Encounter (HOSPITAL_BASED_OUTPATIENT_CLINIC_OR_DEPARTMENT_OTHER): Payer: Self-pay | Admitting: Physical Therapy

## 2022-12-16 ENCOUNTER — Other Ambulatory Visit (HOSPITAL_BASED_OUTPATIENT_CLINIC_OR_DEPARTMENT_OTHER): Payer: Self-pay

## 2022-12-23 ENCOUNTER — Telehealth: Payer: Medicare Other | Admitting: Nurse Practitioner

## 2022-12-23 ENCOUNTER — Other Ambulatory Visit (HOSPITAL_BASED_OUTPATIENT_CLINIC_OR_DEPARTMENT_OTHER): Payer: Self-pay

## 2022-12-23 DIAGNOSIS — J40 Bronchitis, not specified as acute or chronic: Secondary | ICD-10-CM

## 2022-12-23 DIAGNOSIS — R110 Nausea: Secondary | ICD-10-CM | POA: Diagnosis not present

## 2022-12-23 MED ORDER — ONDANSETRON 4 MG PO TBDP
4.0000 mg | ORAL_TABLET | Freq: Three times a day (TID) | ORAL | 0 refills | Status: DC | PRN
  Filled 2022-12-23: qty 20, 7d supply, fill #0

## 2022-12-23 MED ORDER — AZITHROMYCIN 250 MG PO TABS
ORAL_TABLET | ORAL | 0 refills | Status: AC
  Filled 2022-12-23: qty 6, 5d supply, fill #0

## 2022-12-23 NOTE — Progress Notes (Signed)
Virtual Visit Consent   Jennifer Mcclain [facility], you are scheduled for a virtual visit with a [facility] provider today. Just as with appointments in the office, your consent must be obtained to participate. Your consent will be active for this visit and any virtual visit you may have with one of our providers in the next 365 days. If you have a MyChart account, a copy of this consent can be sent to you electronically.  As this is a virtual visit, video technology does not allow for your provider to perform a traditional examination. This may limit your provider's ability to fully assess your condition. If your provider identifies any concerns that need to be evaluated in person or the need to arrange testing (such as labs, EKG, etc.), we will make arrangements to do so. Although advances in technology are sophisticated, we cannot ensure that it will always work on either your end or our end. If the connection with a video visit is poor, the visit may have to be switched to a telephone visit. With either a video or telephone visit, we are not always able to ensure that we have a secure connection.  By engaging in this virtual visit, you consent to the provision of healthcare and authorize for your insurance to be billed (if applicable) for the services provided during this visit. Depending on your insurance coverage, you may receive a charge related to this service.  I need to obtain your verbal consent now. Are you willing to proceed with your visit today? [name] has provided verbal consent on 12/23/2022 for a virtual visit (video or telephone). Viviano Simas, FNP  Date: 12/23/2022 4:27 PM  Virtual Visit via Video Note   I, Viviano Simas, connected with  Jennifer Mcclain  ([id number], 1946/01/11) on 12/23/22 at  4:45 PM EST by a video-enabled telemedicine application and verified that I am speaking with the correct person using two identifiers.  Location: Patient: Virtual Visit Location  Patient: Home Provider: Virtual Visit Location Provider: Home Office   I discussed the limitations of evaluation and management by telemedicine and the availability of in person appointments. The patient expressed understanding and agreed to proceed.    History of Present Illness: Jennifer Mcclain is a 77 y.o. who identifies as a female who was assigned female at birth, and is being seen today for "Chest cold symptom treatment. I am coughing ALOT, mucous is light yellowish color. Some nausea. Began symptoms on Sunday. Have been taking zinc and Ricola cough drops. Not achy or chilled. Have had flu and Covid vacs. I am taking Entresto, xarelto and Metropolol. "  The first few days her cough was persistent now is more of a dry cough  She does feel an expiratory wheeze is present now   She has had an intermittent headache  Only feels nasal drainage when she coughs  Denies baseline sinus congestion    She has not had a fever  Denies any close contacts with similar symptoms   Denies a history of asthma or need for inhalers in the past  She has not had pneumonia in the past    Problems:  Patient Active Problem List   Diagnosis Date Noted   Left wrist pain 10/03/2022   Brittle nails 10/03/2022   Chronic pain of right knee 10/03/2022   Mid back pain 03/24/2022   Skin growth 03/24/2022   Wellness examination 02/23/2021   Skin infection 02/23/2021   Morbid obesity (HCC) 02/23/2021  Anticoagulant long-term use 05/04/2019   Chronic combined systolic and diastolic heart failure (HCC) 05/04/2019   Positive 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 09/15/2018   Pure hypercholesterolemia 06/14/2018   Coronary artery disease due to lipid rich plaque 03/02/2018   Systolic dysfunction without heart failure 02/02/2018   Nonischemic cardiomyopathy (HCC) 02/02/2018   Nonrheumatic mitral valve regurgitation 02/02/2018   Nonrheumatic tricuspid valve regurgitation 02/02/2018   Osteopenia 02/22/2017   Bilateral  sensorineural hearing loss 01/28/2017   Vitamin D deficiency 01/21/2017   Osteoarthritis of knee 01/21/2017   Non-toxic multinodular goiter 01/21/2017   IFG (impaired fasting glucose) 01/21/2017   Lactose intolerance 01/21/2017   History of hysterectomy for benign disease 01/21/2017   Multiple actinic keratoses 01/21/2017   Normocytic anemia 01/21/2017   Umbilical hernia 01/21/2017   Primary hypertension 11/30/2016   Dyslipidemia 11/30/2016   Chronic atrial fibrillation (HCC) 11/30/2016   Obstructive sleep apnea syndrome 11/30/2016   Renal cyst 10/11/2012    Allergies:  Allergies  Allergen Reactions   Latex Rash and Other (See Comments)    welps   Adhesive [Tape]     Skin irritation    Grass Extracts [Gramineae Pollens]      Per records from from [facility]   Lactose Intolerance (Gi) Other (See Comments)    Unknown   Naprosyn [Naproxen] Hives   Coq10 [Coenzyme Q10] Other (See Comments)    Cramps    Zocor [Simvastatin] Other (See Comments)    cramps   Medications:  Current Outpatient Medications:    acetaminophen (TYLENOL) 650 MG CR tablet, Take 650 mg by mouth at bedtime as needed for pain., Disp: , Rfl:    Calcium Carb-Cholecalciferol (CALCIUM 600/VITAMIN D3 PO), Take 1 tablet by mouth 2 (two) times daily., Disp: , Rfl:    Loratadine (CLARITIN PO), Take by mouth daily., Disp: , Rfl:    metoprolol succinate (TOPROL-XL) 50 MG 24 hr tablet, Take 2 tablets (100 mg total) by mouth in the morning AND 1 tablet (50 mg total) every evening., Disp: 270 tablet, Rfl: 3   Multiple Vitamins-Minerals (OCUVITE PO), Take 1 tablet by mouth daily. , Disp: , Rfl:    polycarbophil (FIBERCON) 625 MG tablet, Take 625 mg by mouth daily., Disp: , Rfl:    Probiotic Product (PHILLIPS COLON HEALTH PO), Take 1 capsule by mouth daily. , Disp: , Rfl:    rivaroxaban (XARELTO) 20 MG TABS tablet, Take 1 tablet (20 mg total) by mouth daily with supper., Disp: 90 tablet, Rfl: 1   rosuvastatin  (CRESTOR) 5 MG tablet, Take 1 tablet (5 mg total) by mouth 4 (four) times a week., Disp: 16 tablet, Rfl: 11   sacubitril-valsartan (ENTRESTO) 49-51 MG, Take 1 tablet by mouth 2 (two) times daily., Disp: 180 tablet, Rfl: 3  Observations/Objective: Patient is well-developed, well-nourished in no acute distress.  Resting comfortably  at home.  Head is normocephalic, atraumatic.  No labored breathing.  Speech is clear and coherent with logical content.  Patient is alert and oriented at baseline.    Assessment and Plan:  1. Bronchitis (Primary)  - azithromycin (ZITHROMAX) 250 MG tablet; Take 2 tablets on day 1, then 1 tablet daily on days 2 through 5  Dispense: 6 tablet; Refill: 0  2. Nausea  - ondansetron (ZOFRAN-ODT) 4 MG disintegrating tablet; Take 1 tablet (4 mg total) by mouth every 8 (eight) hours as needed.  Dispense: 20 tablet; Refill: 0    Advised Mucinex in combination with medications Push fluids and assure protein  intake as well  Strict follow up precautions discussed for any persistent or worsening symptoms     Follow Up Instructions: I discussed the assessment and treatment plan with the patient. The patient was provided an opportunity to ask questions and all were answered. The patient agreed with the plan and demonstrated an understanding of the instructions.  A copy of instructions were sent to the patient via MyChart unless otherwise noted below.    The patient was advised to call back or seek an in-person evaluation if the symptoms worsen or if the condition fails to improve as anticipated.    Viviano Simas, FNP

## 2022-12-27 ENCOUNTER — Encounter: Payer: Self-pay | Admitting: Nurse Practitioner

## 2022-12-27 ENCOUNTER — Encounter (HOSPITAL_BASED_OUTPATIENT_CLINIC_OR_DEPARTMENT_OTHER): Payer: Self-pay

## 2022-12-27 DIAGNOSIS — J439 Emphysema, unspecified: Secondary | ICD-10-CM

## 2022-12-28 ENCOUNTER — Ambulatory Visit (HOSPITAL_BASED_OUTPATIENT_CLINIC_OR_DEPARTMENT_OTHER): Payer: Medicare Other

## 2022-12-28 ENCOUNTER — Encounter (HOSPITAL_BASED_OUTPATIENT_CLINIC_OR_DEPARTMENT_OTHER): Payer: Self-pay

## 2022-12-28 ENCOUNTER — Other Ambulatory Visit (HOSPITAL_BASED_OUTPATIENT_CLINIC_OR_DEPARTMENT_OTHER): Payer: Self-pay | Admitting: Family Medicine

## 2022-12-28 ENCOUNTER — Ambulatory Visit (INDEPENDENT_AMBULATORY_CARE_PROVIDER_SITE_OTHER): Payer: Medicare Other | Admitting: Family Medicine

## 2022-12-28 VITALS — BP 136/92 | HR 85 | Ht 62.0 in | Wt 198.8 lb

## 2022-12-28 DIAGNOSIS — R062 Wheezing: Secondary | ICD-10-CM

## 2022-12-28 DIAGNOSIS — R059 Cough, unspecified: Secondary | ICD-10-CM | POA: Diagnosis not present

## 2022-12-28 DIAGNOSIS — R058 Other specified cough: Secondary | ICD-10-CM

## 2022-12-28 DIAGNOSIS — I5042 Chronic combined systolic (congestive) and diastolic (congestive) heart failure: Secondary | ICD-10-CM | POA: Diagnosis not present

## 2022-12-28 DIAGNOSIS — R051 Acute cough: Secondary | ICD-10-CM

## 2022-12-28 DIAGNOSIS — I517 Cardiomegaly: Secondary | ICD-10-CM | POA: Diagnosis not present

## 2022-12-28 DIAGNOSIS — J439 Emphysema, unspecified: Secondary | ICD-10-CM | POA: Diagnosis not present

## 2022-12-28 NOTE — Patient Instructions (Signed)

## 2022-12-28 NOTE — Progress Notes (Signed)
   Acute Office Visit  Subjective:     Patient ID: Jennifer Mcclain, female    DOB: [birth year], 77 y.o.   MRN: [id number]  Chief Complaint  Patient presents with   Cough    Was told she had bronchitis, via MyChart visit on 12/12 symptoms started about 10 days ago wants chest xray to be sure she doesn't have pneumonia due to her still coughing up mucus. Mucus has a hint of yellow   Jennifer Mcclain is a 77 year old female patient who presents today for an ongoing cough.  She reports she had lunch and "everything loosened." Productive cough.    Review of Systems  Constitutional:  Negative for chills and fever.  HENT:  Negative for congestion and sinus pain.   Respiratory:  Positive for cough, sputum production and wheezing.   Cardiovascular:  Negative for chest pain, palpitations and leg swelling.  Neurological:  Positive for headaches.     Objective:    BP (!) 136/92   Pulse 85   Ht 5\' 2"  (1.575 m)   Wt 198 lb 12.8 oz (90.2 kg)   SpO2 97%   BMI 36.36 kg/m   Physical Exam Constitutional:      General: She is not in acute distress.    Appearance: Normal appearance. She is not ill-appearing, toxic-appearing or diaphoretic.  HENT:     Right Ear: Tympanic membrane, ear canal and external ear normal.     Left Ear: Tympanic membrane, ear canal and external ear normal.     Nose: Congestion present.     Right Turbinates: Not enlarged or swollen.     Left Turbinates: Not enlarged or swollen.     Right Sinus: No maxillary sinus tenderness or frontal sinus tenderness.     Left Sinus: No maxillary sinus tenderness or frontal sinus tenderness.  Cardiovascular:     Rate and Rhythm: Tachycardia present. Rhythm irregular.  Pulmonary:     Effort: Pulmonary effort is normal.     Breath sounds: Examination of the right-upper field reveals wheezing. Examination of the left-upper field reveals wheezing. Examination of the right-middle field reveals wheezing. Examination of the left-middle  field reveals wheezing. Wheezing (expiratory wheezes posteriorally bilaterally) present.  Neurological:     Mental Status: She is alert.  Psychiatric:        Mood and Affect: Mood normal.        Behavior: Behavior normal.    Assessment & Plan:   1. Expiratory wheezing (Primary) Patient is a pleasant 77 year old female patient who presents today for concerns of ongoing cough and wheezing. She had a virtual visit on 12/23/2022 and was treated for acute bronchitis, completing her course of antibiotics. She reports she is feeling better but still has a cough. No systemic symptoms- denies fever/chills, body aches, chest pain. Physical exam with notable posterior upper & mid lung lobes with expiratory wheezing present. Cardiovascular exam with irregular heart rate and rhythm. Normal heart sounds, no murmurs present. No lower extremity edema present. Chest x-ray is warranted based on ongoing cough and expiratory wheezing. Will update patient with results pending CXR results.   2. Cough with expectoration See #1 - DG Chest 2 View; Future   Return if symptoms worsen or fail to improve.  Alyson Reedy, FNP

## 2022-12-31 ENCOUNTER — Other Ambulatory Visit (HOSPITAL_BASED_OUTPATIENT_CLINIC_OR_DEPARTMENT_OTHER): Payer: Self-pay

## 2023-01-03 ENCOUNTER — Other Ambulatory Visit (HOSPITAL_BASED_OUTPATIENT_CLINIC_OR_DEPARTMENT_OTHER): Payer: Self-pay | Admitting: Family Medicine

## 2023-01-03 ENCOUNTER — Other Ambulatory Visit (HOSPITAL_BASED_OUTPATIENT_CLINIC_OR_DEPARTMENT_OTHER): Payer: Self-pay

## 2023-01-03 DIAGNOSIS — J439 Emphysema, unspecified: Secondary | ICD-10-CM

## 2023-01-03 MED ORDER — BUDESONIDE-FORMOTEROL FUMARATE 160-4.5 MCG/ACT IN AERO
2.0000 | INHALATION_SPRAY | Freq: Two times a day (BID) | RESPIRATORY_TRACT | 3 refills | Status: DC
  Filled 2023-01-04: qty 10.2, 30d supply, fill #0
  Filled 2023-01-31: qty 10.2, 30d supply, fill #1
  Filled 2023-02-08: qty 10.2, 30d supply, fill #2

## 2023-01-04 ENCOUNTER — Other Ambulatory Visit (HOSPITAL_BASED_OUTPATIENT_CLINIC_OR_DEPARTMENT_OTHER): Payer: Self-pay

## 2023-01-10 ENCOUNTER — Other Ambulatory Visit (HOSPITAL_BASED_OUTPATIENT_CLINIC_OR_DEPARTMENT_OTHER): Payer: Self-pay

## 2023-01-12 ENCOUNTER — Other Ambulatory Visit (HOSPITAL_BASED_OUTPATIENT_CLINIC_OR_DEPARTMENT_OTHER): Payer: Self-pay

## 2023-01-12 ENCOUNTER — Encounter (HOSPITAL_BASED_OUTPATIENT_CLINIC_OR_DEPARTMENT_OTHER): Payer: Self-pay | Admitting: Family Medicine

## 2023-01-13 ENCOUNTER — Other Ambulatory Visit (HOSPITAL_BASED_OUTPATIENT_CLINIC_OR_DEPARTMENT_OTHER): Payer: Self-pay

## 2023-01-24 ENCOUNTER — Other Ambulatory Visit (HOSPITAL_BASED_OUTPATIENT_CLINIC_OR_DEPARTMENT_OTHER): Payer: Self-pay

## 2023-01-25 ENCOUNTER — Encounter: Payer: Self-pay | Admitting: Physical Medicine & Rehabilitation

## 2023-01-25 ENCOUNTER — Encounter: Payer: Medicare Other | Attending: Physical Medicine & Rehabilitation | Admitting: Physical Medicine & Rehabilitation

## 2023-01-25 VITALS — BP 144/97 | HR 82 | Ht 62.0 in | Wt 199.0 lb

## 2023-01-25 DIAGNOSIS — G8929 Other chronic pain: Secondary | ICD-10-CM | POA: Insufficient documentation

## 2023-01-25 DIAGNOSIS — M545 Low back pain, unspecified: Secondary | ICD-10-CM | POA: Diagnosis not present

## 2023-01-25 NOTE — Progress Notes (Signed)
 Subjective:    Patient ID: Jennifer Mcclain, female    DOB: 1945/03/26, 78 y.o.   MRN: [id number] 78 year old female who was referred by primary care provider for chronic pain of the right knee as well as mid back pain.  Patient states that she does have chronic back pain but indicates on her pain diagram that is lower on the left side.  Also complains of bilateral Achilles pain. Left sided low back pain  Sometimes feels with exercising  Had initial injury when she was in her 30s. Has tried ibuprofen for pain  No DC Has tried acupuncture for her ankle pain   Hx L humeral fracture now with arthritis  No hx of back surgery   LEFT TKR Hx Right knee arthroscopy  HPI  Went through PT , still with intermittent mid back pain her knee pain is not a major complaint at this time.  She asked about the impact of weight and we discussed both impact on knee pain as well as posture which could relate to back pain. Discussed even a 10% drop in body weight may reduce knee pain symptoms. Pain Inventory Average Pain 5 Pain Right Now 0 My pain is intermittent, sharp, and stabbing  In the last 24 hours, has pain interfered with the following? General activity 0 Relation with others 0 Enjoyment of life 0 What TIME of day is your pain at its worst? varies Sleep (in general) Good  Pain is worse with: some activites Pain improves with: rest and medication Relief from Meds: 6  Family History  Problem Relation Age of Onset   Congestive Heart Failure Mother    Diabetes Mother 77   Arthritis Mother 73   Stroke Father    Dementia Father    Diabetes Father    Hyperlipidemia Father    Alzheimer's disease Father 75   Diabetes Sister    Cancer Maternal Grandfather    Diabetes Maternal Grandfather 62   CVA Maternal Grandfather    AAA (abdominal aortic aneurysm) Sister    Congenital heart disease Daughter    Heart attack Maternal Aunt    Lung cancer Maternal Grandmother 64   Breast cancer  Paternal Aunt    Social History   Socioeconomic History   Marital status: Married    Spouse name: Not on file   Number of children: Not on file   Years of education: Not on file   Highest education level: Bachelor's degree (e.g., BA, AB, BS)  Occupational History   Not on file  Tobacco Use   Smoking status: Never   Smokeless tobacco: Never  Vaping Use   Vaping status: Never Used  Substance and Sexual Activity   Alcohol use: Yes    Comment: 2-3 a month - social   Drug use: Never   Sexual activity: Not on file  Other Topics Concern   Not on file  Social History Narrative   Per [facility] New Patient Packet 12/06/17:      Diet: Low fat, dairy free      Caffeine: Yes      Married, if yes what year: Married, 1971      Do you live in a house, apartment, assisted living, condo, trailer, ect: Condo, 2 stories, 2 persons       Pets: No      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 No   Do you have difficulty preparing food or eating? No   Do you have difficulty managing your medications? No   Do you have difficulty managing your finances? No   Do you have difficulty affording your medications? No   Social Drivers of Corporate Investment Banker Strain: Low Risk  (12/28/2022)   Overall Financial Resource Strain (CARDIA)    Difficulty of Paying Living Expenses: Not hard at all  Food Insecurity: No Food Insecurity (12/28/2022)   Hunger Vital Sign    Worried About Running Out of Food in the Last Year: Never true    Ran Out of Food in the Last Year: Never true  Transportation Needs: No Transportation Needs (12/28/2022)   PRAPARE - Administrator, Civil Service (Medical): No    Lack of Transportation (Non-Medical): No  Physical  Activity: Insufficiently Active (12/28/2022)   Exercise Vital Sign    Days of Exercise per Week: 3 days    Minutes of Exercise per Session: 40 min  Stress: No Stress Concern Present (12/28/2022)   [employer] of Occupational Health - Occupational Stress Questionnaire    Feeling of Stress : Not at all  Social Connections: Moderately Integrated (12/28/2022)   Social Connection and Isolation Panel [NHANES]    Frequency of Communication with Friends and Family: Twice a week    Frequency of Social Gatherings with Friends and Family: Once a week    Attends Religious Services: Never    Database Administrator or Organizations: Yes    Attends Engineer, Structural: More than 4 times per year    Marital Status: Married   Past Surgical History:  Procedure Laterality Date   ABDOMINAL HYSTERECTOMY  1989   One ovary, Per [facility] New Patient packet    ABLATION  1996   Radical, Per [facility] New Patient packet    BREAST CYST ASPIRATION Bilateral    numerous, over several years per pt, always benign   COLONOSCOPY  2019   Dr.Melissa Lynwood, Per [facility] New Patient packet    KNEE ARTHROSCOPY W/ MENISCAL REPAIR  2002   Per [facility] New Patient packet    MOUTH SURGERY     Dental Implant    REPLACEMENT TOTAL KNEE Left 2015   Per [facility] New Patient packet   RIGHT/LEFT HEART CATH AND CORONARY ANGIOGRAPHY N/A 02/07/2018   Procedure: RIGHT/LEFT HEART CATH AND CORONARY ANGIOGRAPHY;  Surgeon: Jordan, Peter M, MD;  Location: [facility] INVASIVE CV LAB;  Service: Cardiovascular;  Laterality: N/A;   TONSILLECTOMY  1951   Per [facility] New Patient packet    TOOTH EXTRACTION  01/12/2019   TOOTH EXTRACTION  06/05/2019   Preparing for dental implant    Past Surgical History:  Procedure Laterality Date   ABDOMINAL HYSTERECTOMY  1989   One ovary, Per [facility] New Patient packet    ABLATION  1996   Radical, Per [facility] New Patient packet    BREAST CYST ASPIRATION Bilateral    numerous, over several years per pt, always benign    COLONOSCOPY  2019   Dr.Melissa Lynwood, Per [facility] New Patient packet    KNEE ARTHROSCOPY W/ MENISCAL REPAIR  2002   Per [facility] New Patient packet    MOUTH SURGERY     Dental Implant    REPLACEMENT  TOTAL KNEE Left 2015   Per [facility] New Patient packet   RIGHT/LEFT HEART CATH AND CORONARY ANGIOGRAPHY N/A 02/07/2018   Procedure: RIGHT/LEFT HEART CATH AND CORONARY ANGIOGRAPHY;  Surgeon: Jordan, Peter M, MD;  Location: [facility] INVASIVE CV LAB;  Service: Cardiovascular;  Laterality: N/A;   TONSILLECTOMY  1951   Per [facility] New Patient packet    TOOTH EXTRACTION  01/12/2019   TOOTH EXTRACTION  06/05/2019   Preparing for dental implant    Past Medical History:  Diagnosis Date   Achilles tendinitis    Per [facility] New Patient packet    Atrial fibrillation [facility])    Per records from [facility] in [place], [place]    Baker's cyst of knee, left    with resulting lower extremity edema   Benign cyst of breast, left    Per records from [facility] in [place], [place]. 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 Per records from [facility] in [place], [place]    H/O calculus of kidney during pregnancy    Eleanor Agent, MD   H/O hysterectomy for benign disease    Eleanor Agent, MD   History of fibula fracture    Right, Per records from [facility] in [place], [place]    History of torn meniscus of right knee    Per records from [facility] in [place], [place]    Humerus fracture    left leg- due to fall   Humerus fracture    Resulting from a fall, Per records from [facility] in [place], [place]    Hypercalcemia    Per records from [facility] in [place], [place]    Hypertension    Per [facility] New Patient packet    Kidney stone on left side    Per records from [facility] in [place], [place]    Lactose intolerance    Per [facility] New Patient packet    Low back pain 03/24/2022   Multiple actinic keratoses    Eleanor Agent, MD   Myalgia due to statin    Per records from [facility] in [place], [place]. Due to crestor     Non-toxic multinodular goiter 01/21/2017   Eleanor Agent, MD   Normocytic anemia    Eleanor Agent, MD   Obstructive sleep apnea syndrome    Per records from [facility] in [place], [place]. Patient did not tolerate CPAP machine    Onychomycosis    Per records from [facility] in [place], [place]    Osteoarthritis    Per [facility] New Patient packet    Osteopenia    Per records from [facility] in [place], [place]. Treated in the past with antiresorptive therapy   Renal cyst 10/11/2012   Simple on CT Scan 10/2012. Per  records from [facility] in [place], [place]    Sensorineural hearing loss (SNHL), bilateral 01/28/2017   Eleanor Agent , MD   Umbilical hernia    Per [facility] New Patient packet    Vitamin D  deficiency    Eleanor agent, MD   Ht 5' 2 (1.575 m)   Wt 199 lb (90.3 kg)   BMI 36.40 kg/m   Opioid Risk Score:   Fall Risk Score:  `1  Depression screen PHQ 2/9     10/15/2022    1:09 PM 03/16/2022    3:39 PM 11/07/2020    3:56 PM 03/24/2020    2:22 PM 03/19/2019    2:33 PM 03/19/2019    2:21 PM 08/16/2018   10:55 AM  Depression screen PHQ 2/9  Decreased Interest 0 0 0 0 0 0 0  Down, Depressed, Hopeless 0 0 0 0 0 0 0  PHQ - 2 Score 0 0 0 0 0 0 0  Altered sleeping 0        Tired, decreased energy 0        Change in appetite 0         Feeling bad or failure about yourself  0        Trouble concentrating 0        Moving slowly or fidgety/restless 0        Suicidal thoughts 0        PHQ-9 Score 0        Difficult doing work/chores Not difficult at all            Review of Systems  Musculoskeletal:  Positive for back pain and gait problem.  All other systems reviewed and are negative.      Objective:   Physical Exam  General No acute distress Mood and affect appropriate Extremities without edema Negative straight leg raising Normal range of motion bilateral knees Lumbar spine has normal range of motion flexion extension lateral bending and rotation I will see her back on a as needed basis motor strength is 5/5 bilateral hip flexion knee extension ankle dorsiflexion plantarflexion Ambulates without assistive device no evidence toe drag or knee instability.      Assessment & Plan:  1.  Chronic low back pain as well as knee pain she is doing relatively well after going through aquatic therapy.  Recommend continuing home exercise program.  At this time I do not think she needs any repeat imaging studies.  She can continue as needed Tylenol  for pain. She did not complete lumbar x-rays but given that she is overall doing better we can hold off for now She will follow-up if her back pain increases again

## 2023-01-31 ENCOUNTER — Other Ambulatory Visit (HOSPITAL_BASED_OUTPATIENT_CLINIC_OR_DEPARTMENT_OTHER): Payer: Self-pay

## 2023-01-31 ENCOUNTER — Ambulatory Visit (INDEPENDENT_AMBULATORY_CARE_PROVIDER_SITE_OTHER): Payer: Medicare Other | Admitting: Family Medicine

## 2023-01-31 ENCOUNTER — Encounter (HOSPITAL_BASED_OUTPATIENT_CLINIC_OR_DEPARTMENT_OTHER): Payer: Self-pay | Admitting: Family Medicine

## 2023-01-31 VITALS — BP 139/74 | HR 93 | Ht 62.0 in | Wt 201.3 lb

## 2023-01-31 DIAGNOSIS — J439 Emphysema, unspecified: Secondary | ICD-10-CM | POA: Diagnosis not present

## 2023-01-31 DIAGNOSIS — R2232 Localized swelling, mass and lump, left upper limb: Secondary | ICD-10-CM | POA: Diagnosis not present

## 2023-01-31 DIAGNOSIS — H60549 Acute eczematoid otitis externa, unspecified ear: Secondary | ICD-10-CM

## 2023-01-31 MED ORDER — TRIAMCINOLONE ACETONIDE 0.025 % EX CREA
1.0000 | TOPICAL_CREAM | Freq: Two times a day (BID) | CUTANEOUS | 0 refills | Status: AC | PRN
  Filled 2023-01-31 (×3): qty 30, 15d supply, fill #0

## 2023-01-31 NOTE — Progress Notes (Signed)
Subjective:   Jennifer Mcclain [facility] May 13, 1945 01/31/2023  Chief Complaint  Patient presents with   Medical Management of Chronic Issues    60-month follow up; states she was recently diagnosed with emphysema which she has concerns about, has a place on her left hand that she wants to have looked at, and also states the edge of her ears will become crusty.    HPI: Jennifer Mcclain presents today for re-assessment and management of chronic medical conditions.  COPD: Jennifer Mcclain presents for the medical management of COPD. She is seeing Pulmonology in February 2025. Patient was diagnosed with in December after Emphysema present on chest x-ray.  Patient was having recurrent cough that was congested and mildly productive.  After chest x-ray, patient was put on Symbicort.  Patient states since starting her Symbicort twice daily she has noticed significant improvement in her breathing, congestion has resolved, and she denies any cough or wheezing.  She denies history of tobacco use, but reports living with her mother for approximately 18 years who was a daily smoker. Current medications: Symbicort 2 puffs BID Oxygen use: no Rescue inhaler frequency: None   Worsening cough or dyspnea: No COPD status: better  Pneumovax: Up to Date Influenza: Up to Date COVID-19: Up to Date    SKIN CONCERN:  Patient reports a "bump" present to her left lateral wrist that has been there for the past year.  She denies any possible trauma, injury or accident that caused area to appear.  She states is mildly painful with certain movements.  Denies redness, swelling, decreased mobility of left wrist, drainage or bleeding.   EAR CONCERN:  Patient reports new onset of "flaking" to her bilateral external ears for the past several months.  Patient notices mild itching.  She denies drainage, pain, change in hearing.  The following portions of the patient's history were reviewed and updated as appropriate:  past medical history, past surgical history, family history, social history, allergies, medications, and problem list.   Patient Active Problem List   Diagnosis Date Noted   Brittle nails 10/03/2022   Chronic pain of right knee 10/03/2022   Mid back pain 03/24/2022   Skin growth 03/24/2022   Morbid obesity (HCC) 02/23/2021   Anticoagulant long-term use 05/04/2019   Chronic combined systolic and diastolic heart failure (HCC) 05/04/2019   Positive 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 09/15/2018   Pure hypercholesterolemia 06/14/2018   Coronary artery disease due to lipid rich plaque 03/02/2018   Systolic dysfunction without heart failure 02/02/2018   Nonischemic cardiomyopathy (HCC) 02/02/2018   Nonrheumatic mitral valve regurgitation 02/02/2018   Nonrheumatic tricuspid valve regurgitation 02/02/2018   Osteopenia 02/22/2017   Bilateral sensorineural hearing loss 01/28/2017   Vitamin D deficiency 01/21/2017   Osteoarthritis of knee 01/21/2017   Non-toxic multinodular goiter 01/21/2017   IFG (impaired fasting glucose) 01/21/2017   Lactose intolerance 01/21/2017   History of hysterectomy for benign disease 01/21/2017   Multiple actinic keratoses 01/21/2017   Normocytic anemia 01/21/2017   Umbilical hernia 01/21/2017   Primary hypertension 11/30/2016   Dyslipidemia 11/30/2016   Chronic atrial fibrillation (HCC) 11/30/2016   Obstructive sleep apnea syndrome 11/30/2016   Renal cyst 10/11/2012   Past Medical History:  Diagnosis Date   Achilles tendinitis    Per [facility] New Patient packet    Atrial fibrillation [facility])    Per records from [facility] in [place], [place]    Baker's cyst of knee, left    with resulting  lower extremity edema   Benign cyst of breast, left    Per records from [facility] in [place], [place]. 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 Per records from [facility] in [place], [place]    H/O calculus of kidney during pregnancy    Lynden Ang, MD   H/O hysterectomy for benign disease    Lynden Ang, MD   History of fibula fracture    Right, Per records from [facility] in [place], [place]    History of torn meniscus of right knee    Per records from [facility] in [place], [place]    Humerus fracture    left leg- due to fall   Humerus fracture    Resulting from a fall, Per records from [facility] in [place], [place]    Hypercalcemia    Per records from [facility] in [place], [place]    Hypertension    Per [facility] New Patient packet    Kidney stone on left side    Per records from [facility] in [place], [place]    Lactose intolerance    Per [facility] New Patient packet    Low back pain 03/24/2022   Multiple actinic keratoses    Lynden Ang, MD   Myalgia due to statin    Per records from [facility] in [place], [place]. Due to crestor    Non-toxic multinodular goiter 01/21/2017   Lynden Ang, MD   Normocytic anemia    Lynden Ang, MD   Obstructive sleep apnea syndrome    Per records from [facility] in [place], [place]. Patient did not tolerate CPAP machine    Onychomycosis    Per records from [facility] in [place], [place]    Osteoarthritis    Per [facility] New Patient packet    Osteopenia    Per records from [facility] in [place], [place]. Treated in the past with antiresorptive therapy   Renal  cyst 10/11/2012   Simple on CT Scan 10/2012. Per records from [facility] in [place], [place]    Sensorineural hearing loss (SNHL), bilateral 01/28/2017   Lynden Ang , MD   Umbilical hernia    Per [facility] New Patient packet    Vitamin D deficiency    Lynden Ang, MD   Past Surgical History:  Procedure Laterality Date   ABDOMINAL HYSTERECTOMY  1989   One ovary, Per [facility] New Patient packet    ABLATION  1996   Radical, Per [facility] New Patient packet    BREAST CYST ASPIRATION Bilateral    numerous, over several years per pt, always benign   COLONOSCOPY  2019   Dr.Melissa Fayrene Fearing, Per [facility] New Patient packet    KNEE ARTHROSCOPY W/ MENISCAL REPAIR  2002   Per  [facility] New Patient packet    MOUTH SURGERY     Dental Implant    REPLACEMENT TOTAL KNEE Left 2015   Per [facility] New Patient packet   RIGHT/LEFT HEART CATH AND CORONARY ANGIOGRAPHY N/A 02/07/2018   Procedure: RIGHT/LEFT HEART CATH AND CORONARY ANGIOGRAPHY;  Surgeon: [place]az[place], Peter M, MD;  Location: [facility] INVASIVE CV LAB;  Service: Cardiovascular;  Laterality: N/A;   TONSILLECTOMY  1951   Per [facility] New Patient packet    TOOTH EXTRACTION  01/12/2019   TOOTH EXTRACTION  06/05/2019   Preparing for dental implant    Family History  Problem Relation Age of Onset   Congestive Heart Failure Mother    Diabetes Mother 75   Arthritis Mother 39   Stroke Father    Dementia Father    Diabetes Father    Hyperlipidemia Father    Alzheimer's disease Father 68   Diabetes Sister    Cancer Maternal Grandfather    Diabetes Maternal Grandfather 56   CVA Maternal Grandfather    AAA (abdominal aortic aneurysm) Sister    Congenital heart disease Daughter    Heart attack Maternal Aunt    Lung cancer Maternal Grandmother 74   Breast cancer Paternal Aunt    Outpatient Medications Prior to Visit  Medication Sig Dispense Refill   acetaminophen (TYLENOL) 650 MG CR tablet Take 650 mg by mouth at bedtime as needed for pain.     budesonide-formoterol  (SYMBICORT) 160-4.5 MCG/ACT inhaler Inhale 2 puffs into the lungs 2 (two) times daily. 10.2 g 3   Calcium Carb-Cholecalciferol (CALCIUM 600/VITAMIN D3 PO) Take 1 tablet by mouth 2 (two) times daily.     Loratadine (CLARITIN PO) Take by mouth daily.     metoprolol succinate (TOPROL-XL) 50 MG 24 hr tablet Take 2 tablets (100 mg total) by mouth in the morning AND 1 tablet (50 mg total) every evening. 270 tablet 3   Multiple Vitamins-Minerals (OCUVITE PO) Take 1 tablet by mouth daily.      ondansetron (ZOFRAN-ODT) 4 MG disintegrating tablet Take 1 tablet (4 mg total) by mouth every 8 (eight) hours as needed. 20 tablet 0   polycarbophil (FIBERCON) 625 MG tablet Take 625 mg by mouth daily.     Probiotic Product (PHILLIPS COLON HEALTH PO) Take 1 capsule by mouth daily.      rivaroxaban (XARELTO) 20 MG TABS tablet Take 1 tablet (20 mg total) by mouth daily with supper. 90 tablet 1   rosuvastatin (CRESTOR) 5 MG tablet Take 1 tablet (5 mg total) by mouth 4 (four) times a week. 16 tablet 11   sacubitril-valsartan (ENTRESTO) 49-51 MG Take 1 tablet by mouth 2 (two) times daily. 180 tablet 3   No facility-administered medications prior to visit.   Allergies  Allergen Reactions   Latex Rash and Other (See Comments)    welps   Adhesive [Tape]     Skin irritation    Grass Extracts [Gramineae Pollens]      Per records from from [facility]   Lactose Intolerance (Gi) Other (See Comments)    Unknown   Naprosyn [Naproxen] Hives   Coq10 [Coenzyme Q10] Other (See Comments)    Cramps    Zocor [Simvastatin] Other (See Comments)    cramps     ROS: A complete ROS was performed with pertinent positives/negatives noted in the HPI. The remainder of the ROS are negative.    Objective:   Today's Vitals   01/31/23 1407  BP: 139/74  Pulse: 93  SpO2: 98%  Weight: 201 lb 4.8 oz (91.3 kg)  Height: 5\' 2"  (1.575 m)    Physical Exam          GENERAL: Well-appearing, in NAD. Well nourished.   SKIN: Pink, warm and dry.  Approximately 0.5 cm firm but mobile blue tinted nodule present to lateral left wrist.  Area is mobile, slightly tender with palpation, no drainage or bleeding. Head: Normocephalic. NECK: Trachea midline. Full ROM w/o pain or tenderness. EARS: Mild skin flaking present to bilateral external auditory canals.  Tympanic membranes are intact, translucent without bulging and without drainage. Appropriate landmarks visualized.  EYES: Conjunctiva clear without exudates. EOMI, PERRL, no drainage present.  RESPIRATORY: Chest wall symmetrical. Respirations even and non-labored. Breath sounds clear to auscultation bilaterally.  CARDIAC: S1, S2 present, regular rate and rhythm without murmur or gallops. Peripheral pulses 2+ bilaterally.  MSK: Muscle tone and strength appropriate for age. Joints w/o tenderness, redness, or swelling.  EXTREMITIES: Without clubbing, cyanosis, or edema.  NEUROLOGIC: No motor or sensory deficits. Steady, even gait. C2-C12 intact.  PSYCH/MENTAL STATUS: Alert, oriented x 3. Cooperative, appropriate mood and affect.   Health Maintenance Due  Topic Date Due   Medicare Annual Wellness (AWV)  03/16/2023       Assessment & Plan:  1. Eczema of external auditory canal (Primary) Patient to use Kenalog 0.025 percent cream twice daily as needed for eczema recurrence.  Discussed use of Aquaphor or Vaseline to moisturize areas for further prevention. - triamcinolone (KENALOG) 0.025 % ointment; Apply 1 Application topically 2 (two) times daily as needed (eczema).  Dispense: 30 g; Refill: 0  2. Nodule of skin of left upper extremity Possible angiolipoma versus vascular lesion, likely benign..  Will send picture with referral to dermatology for further evaluation. - Ambulatory referral to Dermatology  3. Pulmonary emphysema, unspecified emphysema type (HCC) Well-controlled and significantly improved.  Will continue on Symbicort and follow-up for likely PFT  testing with pulmonology.   Meds ordered this encounter  Medications   triamcinolone (KENALOG) 0.025 % ointment    Sig: Apply 1 Application topically 2 (two) times daily as needed (eczema).    Dispense:  30 g    Refill:  0    Supervising Provider:   DE Peru, RAYMOND J [[id number]]    Return in about 6 months (around 07/31/2023) for follow up Chronic Conditions (COPD, .    Patient to reach out to office if new, worrisome, or unresolved symptoms arise or if no improvement in patient's condition. Patient verbalized understanding and is agreeable to treatment plan. All questions answered to patient's satisfaction.    Hilbert Bible, [place]

## 2023-02-03 ENCOUNTER — Encounter (HOSPITAL_BASED_OUTPATIENT_CLINIC_OR_DEPARTMENT_OTHER): Payer: Self-pay | Admitting: Family Medicine

## 2023-02-03 DIAGNOSIS — J439 Emphysema, unspecified: Secondary | ICD-10-CM

## 2023-02-04 NOTE — Telephone Encounter (Signed)
Alexis please see mychart sent by pt and advise.

## 2023-02-07 ENCOUNTER — Other Ambulatory Visit (HOSPITAL_BASED_OUTPATIENT_CLINIC_OR_DEPARTMENT_OTHER): Payer: Self-pay

## 2023-02-07 MED ORDER — ALBUTEROL SULFATE HFA 108 (90 BASE) MCG/ACT IN AERS
2.0000 | INHALATION_SPRAY | Freq: Four times a day (QID) | RESPIRATORY_TRACT | 2 refills | Status: AC | PRN
  Filled 2023-02-07: qty 6.7, 28d supply, fill #0

## 2023-02-08 ENCOUNTER — Other Ambulatory Visit (HOSPITAL_BASED_OUTPATIENT_CLINIC_OR_DEPARTMENT_OTHER): Payer: Self-pay

## 2023-02-08 ENCOUNTER — Other Ambulatory Visit: Payer: Self-pay

## 2023-02-09 ENCOUNTER — Encounter (HOSPITAL_BASED_OUTPATIENT_CLINIC_OR_DEPARTMENT_OTHER): Payer: Self-pay | Admitting: Family Medicine

## 2023-02-09 ENCOUNTER — Other Ambulatory Visit (HOSPITAL_BASED_OUTPATIENT_CLINIC_OR_DEPARTMENT_OTHER): Payer: Self-pay

## 2023-02-09 NOTE — Telephone Encounter (Signed)
Jon Gills, please see mychart sent by pt about inhaler not being on formulary with insurance.

## 2023-02-11 ENCOUNTER — Other Ambulatory Visit (HOSPITAL_BASED_OUTPATIENT_CLINIC_OR_DEPARTMENT_OTHER): Payer: Self-pay

## 2023-02-11 ENCOUNTER — Other Ambulatory Visit (HOSPITAL_BASED_OUTPATIENT_CLINIC_OR_DEPARTMENT_OTHER): Payer: Self-pay | Admitting: Family Medicine

## 2023-02-11 DIAGNOSIS — J439 Emphysema, unspecified: Secondary | ICD-10-CM

## 2023-02-11 MED ORDER — FLUTICASONE FUROATE-VILANTEROL 100-25 MCG/ACT IN AEPB
1.0000 | INHALATION_SPRAY | Freq: Every day | RESPIRATORY_TRACT | 11 refills | Status: DC
  Filled 2023-02-11: qty 60, 30d supply, fill #0
  Filled 2023-02-11 (×2): qty 60, 60d supply, fill #0
  Filled 2023-02-11: qty 60, 30d supply, fill #0
  Filled 2023-03-31: qty 60, 30d supply, fill #1

## 2023-02-12 ENCOUNTER — Other Ambulatory Visit (HOSPITAL_BASED_OUTPATIENT_CLINIC_OR_DEPARTMENT_OTHER): Payer: Self-pay

## 2023-02-16 ENCOUNTER — Other Ambulatory Visit (HOSPITAL_BASED_OUTPATIENT_CLINIC_OR_DEPARTMENT_OTHER): Payer: Self-pay

## 2023-02-22 ENCOUNTER — Other Ambulatory Visit (HOSPITAL_BASED_OUTPATIENT_CLINIC_OR_DEPARTMENT_OTHER): Payer: Self-pay

## 2023-02-25 DIAGNOSIS — Z961 Presence of intraocular lens: Secondary | ICD-10-CM | POA: Diagnosis not present

## 2023-02-25 DIAGNOSIS — H1789 Other corneal scars and opacities: Secondary | ICD-10-CM | POA: Diagnosis not present

## 2023-02-25 DIAGNOSIS — H524 Presbyopia: Secondary | ICD-10-CM | POA: Diagnosis not present

## 2023-03-07 ENCOUNTER — Ambulatory Visit (HOSPITAL_BASED_OUTPATIENT_CLINIC_OR_DEPARTMENT_OTHER): Payer: Medicare Other | Admitting: Pulmonary Disease

## 2023-03-07 ENCOUNTER — Encounter (HOSPITAL_BASED_OUTPATIENT_CLINIC_OR_DEPARTMENT_OTHER): Payer: Self-pay | Admitting: Pulmonary Disease

## 2023-03-07 ENCOUNTER — Other Ambulatory Visit (HOSPITAL_BASED_OUTPATIENT_CLINIC_OR_DEPARTMENT_OTHER): Payer: Self-pay

## 2023-03-07 VITALS — BP 122/68 | HR 82 | Ht 62.0 in | Wt 200.6 lb

## 2023-03-07 DIAGNOSIS — J42 Unspecified chronic bronchitis: Secondary | ICD-10-CM

## 2023-03-07 MED ORDER — AREXVY 120 MCG/0.5ML IM SUSR
0.5000 mL | Freq: Once | INTRAMUSCULAR | 0 refills | Status: AC
  Filled 2023-03-07: qty 0.5, 1d supply, fill #0

## 2023-03-07 NOTE — Patient Instructions (Addendum)
 Chronic bronchitis --ORDER pulmonary function to evaluate for asthma --CONTINUE/START Breo 100-25 ONE inhalation daily. Rinse out mouth after use --If the inhaler is not as effective as Symbicort, please contact our office and we can increase to the high dose --OK to use albuterol AS NEEDED for shortness of breath and wheezing --Administer RSV vaccine PFT next available with follow-up visit after

## 2023-03-07 NOTE — Progress Notes (Unsigned)
 Subjective:   PATIENT ID: Jennifer Mcclain GENDER: female DOB: [birth year], MRN: [id number]  Chief Complaint  Patient presents with   Establish Care    Emphysema    Reason for Visit: New consult for emphysema  [name] is a 78 year old female never smoker with atrial fibrillation on Xarelto, HTN, HLD, allergic rhinitis who presents for emphysema.   She reports she had a bronchial infection in December and was told she had emphysema on CXR. Never had this diagnosis before. Reports history of allergy of dust and allergy. Denies childhood asthma symptoms or diagnosis. She was given Symbicort two puffs BID and felt symptoms improved including cough. Before using this inhaler she has daily phlegm production for a few years and maybe associated with wheeze but rarely. Denies recurrent infections until the last few years. Has nasal congestion. Previously engaged in cheerleading, aerobic dance classes and currently in water exercise classes.   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  Past Medical History:  Diagnosis Date   Achilles tendinitis    Per [facility] New Patient packet    Atrial fibrillation [facility])    Per records from [facility] in [place], [place]    Baker's cyst of knee, left    with resulting lower extremity edema   Benign cyst of breast, left    Per records from [facility] in [place], [place]. 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 Per records from [facility] in [place], [place]    H/O calculus of kidney during pregnancy    Lynden Ang, MD   H/O hysterectomy for benign disease    Lynden Ang, MD   History of fibula fracture    Right, Per records from [facility] in [place], [place]    History of torn meniscus of right knee    Per records from [facility] in [place], [place]    Humerus fracture    left leg- due to fall   Humerus fracture    Resulting from a fall, Per records from [facility] in [place], [place]    Hypercalcemia    Per records from [facility] in [place], [place]    Hypertension    Per [facility] New Patient packet    Kidney stone on left side    Per records from [facility] in [place], [place]    Lactose intolerance    Per [facility] New Patient packet    Low back pain 03/24/2022   Multiple actinic keratoses    Lynden Ang, MD   Myalgia due to statin    Per records from [facility] in [place], [place]. Due to crestor  Non-toxic multinodular goiter 01/21/2017   Lynden Ang, MD   Normocytic anemia    Lynden Ang, MD   Obstructive sleep apnea syndrome    Per records from [facility] in [place], [place]. Patient did not tolerate CPAP machine    Onychomycosis    Per records from [facility] in [place], [place]    Osteoarthritis    Per [facility] New Patient packet    Osteopenia    Per records from [facility] in [place], [place]. Treated in the past with antiresorptive therapy   Renal cyst 10/11/2012   Simple on CT Scan 10/2012. Per records from [facility] in [place], [place]    Sensorineural hearing loss (SNHL), bilateral 01/28/2017   Lynden Ang , MD   Umbilical hernia     Per [facility] New Patient packet    Vitamin D deficiency    Lynden Ang, MD     Family History  Problem Relation Age of Onset   Congestive Heart Failure Mother    Diabetes Mother 39   Arthritis Mother 65   Stroke Father    Dementia Father    Diabetes Father    Hyperlipidemia Father    Alzheimer's disease Father 33   Diabetes Sister    Cancer Maternal Grandfather    Diabetes Maternal Grandfather 7   CVA Maternal Grandfather    AAA (abdominal aortic aneurysm) Sister    Congenital heart disease Daughter    Heart attack Maternal Aunt    Lung cancer Maternal Grandmother 63   Breast cancer Paternal Aunt      Social History   Occupational History   Not on file  Tobacco Use   Smoking status: Never   Smokeless tobacco: Never  Vaping Use   Vaping status: Never Used  Substance and Sexual Activity   Alcohol use: Yes    Comment: 2-3 a month - social   Drug use: Never   Sexual activity: Not on file    Allergies  Allergen Reactions   Latex Rash and Other (See Comments)    welps   Adhesive [Tape]     Skin irritation    Grass Extracts [Gramineae Pollens]      Per records from from [facility]   Lactose Intolerance (Gi) Other (See Comments)    Unknown   Naprosyn [Naproxen] Hives   Coq10 [Coenzyme Q10] Other (See Comments)    Cramps    Zocor [Simvastatin] Other (See Comments)    cramps     Outpatient Medications Prior to Visit  Medication Sig Dispense Refill   acetaminophen (TYLENOL) 650 MG CR tablet Take 650 mg by mouth at bedtime as needed for pain.     albuterol (VENTOLIN HFA) 108 (90 Base) MCG/ACT inhaler Inhale 2 puffs into the lungs every 6 (six) hours as needed for wheezing or shortness of breath. 6.7 g 2   Calcium Carb-Cholecalciferol (CALCIUM 600/VITAMIN D3 PO) Take 1 tablet by mouth 2 (two) times daily.     fluticasone furoate-vilanterol (BREO ELLIPTA) 100-25 MCG/ACT AEPB Inhale 1 puff into the lungs daily. 60 each 11   Loratadine (CLARITIN PO) Take by  mouth daily.     metoprolol succinate (TOPROL-XL) 50 MG 24 hr tablet Take 2 tablets (100 mg total) by mouth in the morning AND 1 tablet (50 mg total) every evening. 270 tablet 3   Multiple Vitamins-Minerals (OCUVITE PO) Take 1 tablet by mouth daily.      polycarbophil (FIBERCON) 625 MG tablet Take 625 mg by  mouth daily.     Probiotic Product (PHILLIPS COLON HEALTH PO) Take 1 capsule by mouth daily.      rivaroxaban (XARELTO) 20 MG TABS tablet Take 1 tablet (20 mg total) by mouth daily with supper. 90 tablet 1   rosuvastatin (CRESTOR) 5 MG tablet Take 1 tablet (5 mg total) by mouth 4 (four) times a week. 16 tablet 11   sacubitril-valsartan (ENTRESTO) 49-51 MG Take 1 tablet by mouth 2 (two) times daily. 180 tablet 3   triamcinolone (KENALOG) 0.025 % cream Apply 1 Application topically 2 (two) times daily as needed (eczema). 30 g 0   ondansetron (ZOFRAN-ODT) 4 MG disintegrating tablet Take 1 tablet (4 mg total) by mouth every 8 (eight) hours as needed. 20 tablet 0   No facility-administered medications prior to visit.    Review of Systems  Constitutional:  Negative for chills, diaphoresis, fever, malaise/fatigue and weight loss.  HENT:  Negative for congestion.   Respiratory:  Negative for cough, hemoptysis, sputum production, shortness of breath and wheezing.   Cardiovascular:  Negative for chest pain, palpitations and leg swelling.     Objective:   Vitals:   03/07/23 1503  BP: 122/68  Pulse: 82  SpO2: 96%  Weight: 200 lb 9.6 oz (91 kg)  Height: 5\' 2"  (1.575 m)   SpO2: 96 %  Physical Exam: General: Well-appearing, no acute distress HENT: [place], AT Eyes: EOMI, no scleral icterus Respiratory: Clear to auscultation bilaterally.  No crackles, wheezing or rales Cardiovascular: RRR, -M/R/G, no JVD Extremities:-Edema,-tenderness Neuro: AAO x4, CNII-XII grossly intact Psych: Normal mood, normal affect  Data Reviewed:  Imaging: CXR 12/28/22 - Hyperinflation, mild cardiomegaly,  unchanged compared to prior  PFT: None on file  Labs: CBC    Component Value Date/Time   WBC 4.8 12/03/2022 1155   WBC 4.0 10/09/2021 1620   RBC 4.16 12/03/2022 1155   RBC 4.04 10/09/2021 1620   HGB 13.4 12/03/2022 1155   HCT 41.0 12/03/2022 1155   PLT 170 12/03/2022 1155   MCV 99 (H) 12/03/2022 1155   MCH 32.2 12/03/2022 1155   MCH 32.4 10/09/2021 1620   MCHC 32.7 12/03/2022 1155   MCHC 33.2 10/09/2021 1620   RDW 12.5 12/03/2022 1155   LYMPHSABS 1.4 03/24/2022 1437   MONOABS 0.5 10/09/2021 1620   EOSABS 0.1 03/24/2022 1437   BASOSABS 0.0 03/24/2022 1437   Absolute eos 03/24/22 - 100     Assessment & Plan:   Discussion: 78 year old female never smoker with atrial fibrillation on Xarelto, HTN, HLD, allergic rhinitis who presents for emphysema. In a never smoker her hyperinflation noted on CXR is likely related to untreated asthma rather than COPD. History of environmental exposures significant for second hand smoke and yarn otherwise minimal. Suspect she may have allergic type asthma/asthmatic bronchitis based on allergies (grass etc) and good response to her current ICS/LABA (Symbicort). Discussed clinical course and management of asthma including bronchodilator regimen, preventive care and action plan for exacerbation.  Chronic bronchitis --ORDER pulmonary function to evaluate for asthma --CONTINUE/START Breo 100-25 ONE inhalation daily. Rinse out mouth after use --If the inhaler is not as effective as Symbicort, please contact our office and we can increase to the high dose --OK to use albuterol AS NEEDED for shortness of breath and wheezing --Administer RSV vaccine today at pharmacy clinic --PFT next available with follow-up visit   Health Maintenance Immunization History  Administered Date(s) Administered   Fluad Quad(high Dose 65+) 09/14/2018, 09/24/2019, 09/26/2020, 10/21/2021  Fluad Trivalent(High Dose 65+) 10/21/2022   Influenza, High Dose Seasonal PF  10/31/2017   Influenza, Quadrivalent, Recombinant, Inj, Pf 10/11/2016   Influenza,inj,quad, With Preservative 10/11/2016   PFIZER(Purple Top)SARS-COV-2 Vaccination 01/30/2019, 02/19/2019, 11/06/2019, 04/23/2020   Pfizer Covid-19 Vaccine Bivalent Booster 2yrs & up 10/01/2020   Pfizer(Comirnaty)Fall Seasonal Vaccine 12 years and older 10/31/2021, 10/21/2022   Pneumococcal Conjugate-13 11/20/2013   Pneumococcal Polysaccharide-23 11/13/2012   Tdap 11/27/2007, 07/05/2018   Zoster Recombinant(Shingrix) 05/08/2016, 08/14/2016   CT Lung Screen - not qualified  Orders Placed This Encounter  Procedures   Pulmonary function test    Standing Status:   Future    Expiration Date:   03/06/2024    Where should this test be performed?:   [facility]    Full PFT: includes the following: basic spirometry, spirometry pre & post bronchodilator, diffusion capacity (DLCO), lung volumes:   Full PFT  No orders of the defined types were placed in this encounter.   Return in about 1 month (around 04/04/2023) for after PFT.  I have spent a total time of 45-minutes on the day of the appointment reviewing prior documentation, coordinating care and discussing medical diagnosis and plan with the patient/family. Imaging, labs and tests included in this note have been reviewed and interpreted independently by me.  Brenda Cowher Mechele Collin, MD [facility] 03/07/2023 3:39 PM

## 2023-03-07 NOTE — Progress Notes (Unsigned)
 0

## 2023-03-22 ENCOUNTER — Ambulatory Visit (HOSPITAL_BASED_OUTPATIENT_CLINIC_OR_DEPARTMENT_OTHER): Payer: Medicare Other

## 2023-03-22 ENCOUNTER — Encounter (HOSPITAL_BASED_OUTPATIENT_CLINIC_OR_DEPARTMENT_OTHER): Payer: Self-pay

## 2023-03-22 DIAGNOSIS — Z780 Asymptomatic menopausal state: Secondary | ICD-10-CM

## 2023-03-22 DIAGNOSIS — Z0000 Encounter for general adult medical examination without abnormal findings: Secondary | ICD-10-CM

## 2023-03-22 NOTE — Patient Instructions (Signed)
 Jennifer Mcclain , Thank you for taking time to come for your Medicare Wellness Visit. I appreciate your ongoing commitment to your health goals. Please review the following plan we discussed and let me know if I can assist you in the future.   Screening recommendations/referrals: Colonoscopy: up to date Mammogram: up to date Bone Density: up to date Recommended yearly ophthalmology/optometry visit for glaucoma screening and checkup Recommended yearly dental visit for hygiene and checkup  Vaccinations: Influenza vaccine: up to date Pneumococcal vaccine: up to date Tdap vaccine: up to date Shingles vaccine: up to date       Preventive Care 65 Years and Older, Female Preventive care refers to lifestyle choices and visits with your health care provider that can promote health and wellness. What does preventive care include? A yearly physical exam. This is also called an annual well check. Dental exams once or twice a year. Routine eye exams. Ask your health care provider how often you should have your eyes checked. Personal lifestyle choices, including: Daily care of your teeth and gums. Regular physical activity. Eating a healthy diet. Avoiding tobacco and drug use. Limiting alcohol use. Practicing safe sex. Taking low-dose aspirin every day. Taking vitamin and mineral supplements as recommended by your health care provider. What happens during an annual well check? The services and screenings done by your health care provider during your annual well check will depend on your age, overall health, lifestyle risk factors, and family history of disease. Counseling  Your health care provider may ask you questions about your: Alcohol use. Tobacco use. Drug use. Emotional well-being. Home and relationship well-being. Sexual activity. Eating habits. History of falls. Memory and ability to understand (cognition). Work and work Astronomer. Reproductive health. Screening  You may  have the following tests or measurements: Height, weight, and BMI. Blood pressure. Lipid and cholesterol levels. These may be checked every 5 years, or more frequently if you are over 80 years old. Skin check. Lung cancer screening. You may have this screening every year starting at age 53 if you have a 30-pack-year history of smoking and currently smoke or have quit within the past 15 years. Fecal occult blood test (FOBT) of the stool. You may have this test every year starting at age 56. Flexible sigmoidoscopy or colonoscopy. You may have a sigmoidoscopy every 5 years or a colonoscopy every 10 years starting at age 48. Hepatitis C blood test. Hepatitis B blood test. Sexually transmitted disease (STD) testing. Diabetes screening. This is done by checking your blood sugar (glucose) after you have not eaten for a while (fasting). You may have this done every 1-3 years. Bone density scan. This is done to screen for osteoporosis. You may have this done starting at age 77. Mammogram. This may be done every 1-2 years. Talk to your health care provider about how often you should have regular mammograms. Talk with your health care provider about your test results, treatment options, and if necessary, the need for more tests. Vaccines  Your health care provider may recommend certain vaccines, such as: Influenza vaccine. This is recommended every year. Tetanus, diphtheria, and acellular pertussis (Tdap, Td) vaccine. You may need a Td booster every 10 years. Zoster vaccine. You may need this after age 52. Pneumococcal 13-valent conjugate (PCV13) vaccine. One dose is recommended after age 33. Pneumococcal polysaccharide (PPSV23) vaccine. One dose is recommended after age 52. Talk to your health care provider about which screenings and vaccines you need and how often you need them.  This information is not intended to replace advice given to you by your health care provider. Make sure you discuss any  questions you have with your health care provider. Document Released: 01/24/2015 Document Revised: 09/17/2015 Document Reviewed: 10/29/2014 Elsevier Interactive Patient Education  2017 [employer].  Fall Prevention in the Home Falls can cause injuries. They can happen to people of all ages. There are many things you can do to make your home safe and to help prevent falls. What can I do on the outside of my home? Regularly fix the edges of walkways and driveways and fix any cracks. Remove anything that might make you trip as you walk through a door, such as a raised step or threshold. Trim any bushes or trees on the path to your home. Use bright outdoor lighting. Clear any walking paths of anything that might make someone trip, such as rocks or tools. Regularly check to see if handrails are loose or broken. Make sure that both sides of any steps have handrails. Any raised decks and porches should have guardrails on the edges. Have any leaves, snow, or ice cleared regularly. Use sand or salt on walking paths during winter. Clean up any spills in your garage right away. This includes oil or grease spills. What can I do in the bathroom? Use night lights. Install grab bars by the toilet and in the tub and shower. Do not use towel bars as grab bars. Use non-skid mats or decals in the tub or shower. If you need to sit down in the shower, use a plastic, non-slip stool. Keep the floor dry. Clean up any water that spills on the floor as soon as it happens. Remove soap buildup in the tub or shower regularly. Attach bath mats securely with double-sided non-slip rug tape. Do not have throw rugs and other things on the floor that can make you trip. What can I do in the bedroom? Use night lights. Make sure that you have a light by your bed that is easy to reach. Do not use any sheets or blankets that are too big for your bed. They should not hang down onto the floor. Have a firm chair that has side  arms. You can use this for support while you get dressed. Do not have throw rugs and other things on the floor that can make you trip. What can I do in the kitchen? Clean up any spills right away. Avoid walking on wet floors. Keep items that you use a lot in easy-to-reach places. If you need to reach something above you, use a strong step stool that has a grab bar. Keep electrical cords out of the way. Do not use floor polish or wax that makes floors slippery. If you must use wax, use non-skid floor wax. Do not have throw rugs and other things on the floor that can make you trip. What can I do with my stairs? Do not leave any items on the stairs. Make sure that there are handrails on both sides of the stairs and use them. Fix handrails that are broken or loose. Make sure that handrails are as long as the stairways. Check any carpeting to make sure that it is firmly attached to the stairs. Fix any carpet that is loose or worn. Avoid having throw rugs at the top or bottom of the stairs. If you do have throw rugs, attach them to the floor with carpet tape. Make sure that you have a light switch at the  top of the stairs and the bottom of the stairs. If you do not have them, ask someone to add them for you. What else can I do to help prevent falls? Wear shoes that: Do not have high heels. Have rubber bottoms. Are comfortable and fit you well. Are closed at the toe. Do not wear sandals. If you use a stepladder: Make sure that it is fully opened. Do not climb a closed stepladder. Make sure that both sides of the stepladder are locked into place. Ask someone to hold it for you, if possible. Clearly mark and make sure that you can see: Any grab bars or handrails. First and last steps. Where the edge of each step is. Use tools that help you move around (mobility aids) if they are needed. These include: Canes. Walkers. Scooters. Crutches. Turn on the lights when you go into a dark area.  Replace any light bulbs as soon as they burn out. Set up your furniture so you have a clear path. Avoid moving your furniture around. If any of your floors are uneven, fix them. If there are any pets around you, be aware of where they are. Review your medicines with your doctor. Some medicines can make you feel dizzy. This can increase your chance of falling. Ask your doctor what other things that you can do to help prevent falls. This information is not intended to replace advice given to you by your health care provider. Make sure you discuss any questions you have with your health care provider. Document Released: 10/24/2008 Document Revised: 06/05/2015 Document Reviewed: 02/01/2014 Elsevier Interactive Patient Education  2017 [employer].

## 2023-03-22 NOTE — Progress Notes (Unsigned)
 Subjective:   Jennifer Mcclain is a 78 y.o. female who presents for Medicare Annual (Subsequent) preventive examination.  Visit Complete: Virtual I connected with  Jennifer Mcclain [facility] on 03/22/23 by a audio enabled telemedicine application and verified that I am speaking with the correct person using two identifiers.  Patient Location: Home  Provider Location: Home Office  I discussed the limitations of evaluation and management by telemedicine. The patient expressed understanding and agreed to proceed.  Vital Signs: Because this visit was a virtual/telehealth visit, some criteria may be missing or patient reported. Any vitals not documented were not able to be obtained and vitals that have been documented are patient reported.  Patient Medicare AWV questionnaire was completed by the patient on 3-7-205; I have confirmed that all information answered by patient is correct and no changes since this date.        Objective:    There were no vitals filed for this visit. There is no height or weight on file to calculate BMI.     03/22/2023    3:12 PM 11/22/2022    1:26 PM 03/16/2022    3:44 PM 10/09/2021    4:20 PM 11/07/2020    3:55 PM 10/13/2020    3:16 PM 09/26/2020    2:31 PM  Advanced Directives  Does Patient Have a Medical Advance Directive? Yes Yes Yes Yes Yes Yes Yes  Type of Estate agent of [employer] Power of [place];Living will Healthcare Power of [place];Living will Healthcare Power of [place];Living will Healthcare Power of [place];Living will Healthcare Power of [place];Living will Healthcare Power of [place];Living will  Does patient want to make changes to medical advance directive?   No - Patient declined  No - Patient declined  No - Patient declined  Copy of Healthcare Power of Attorney in Chart? No - copy requested  Yes - validated most recent copy scanned in chart (See row information)  Yes - validated most recent copy scanned in  chart (See row information) Yes - validated most recent copy scanned in chart (See row information) Yes - validated most recent copy scanned in chart (See row information)    Current Medications (verified) Outpatient Encounter Medications as of 03/22/2023  Medication Sig   acetaminophen (TYLENOL) 650 MG CR tablet Take 650 mg by mouth at bedtime as needed for pain.   albuterol (VENTOLIN HFA) 108 (90 Base) MCG/ACT inhaler Inhale 2 puffs into the lungs every 6 (six) hours as needed for wheezing or shortness of breath.   Calcium Carb-Cholecalciferol (CALCIUM 600/VITAMIN D3 PO) Take 1 tablet by mouth 2 (two) times daily.   fluticasone furoate-vilanterol (BREO ELLIPTA) 100-25 MCG/ACT AEPB Inhale 1 puff into the lungs daily.   Loratadine (CLARITIN PO) Take by mouth daily.   metoprolol succinate (TOPROL-XL) 50 MG 24 hr tablet Take 2 tablets (100 mg total) by mouth in the morning AND 1 tablet (50 mg total) every evening.   Multiple Vitamins-Minerals (OCUVITE PO) Take 1 tablet by mouth daily.    polycarbophil (FIBERCON) 625 MG tablet Take 625 mg by mouth daily.   Probiotic Product (PHILLIPS COLON HEALTH PO) Take 1 capsule by mouth daily.    rivaroxaban (XARELTO) 20 MG TABS tablet Take 1 tablet (20 mg total) by mouth daily with supper.   rosuvastatin (CRESTOR) 5 MG tablet Take 1 tablet (5 mg total) by mouth 4 (four) times a week.   sacubitril-valsartan (ENTRESTO) 49-51 MG Take 1 tablet by mouth 2 (two) times daily.  triamcinolone (KENALOG) 0.025 % cream Apply 1 Application topically 2 (two) times daily as needed (eczema).   No facility-administered encounter medications on file as of 03/22/2023.    Allergies (verified) Latex, Adhesive [tape], Grass extracts [gramineae pollens], Lactose intolerance (gi), Naprosyn [naproxen], Coq10 [coenzyme q10], and Zocor [simvastatin]   History: Past Medical History:  Diagnosis Date   Achilles tendinitis    Per [facility] New Patient packet    Atrial fibrillation [facility])     Per records from [facility] in [place], [place]    Baker's cyst of knee, left    with resulting lower extremity edema   Benign cyst of breast, left    Per records from [facility] in [place], [place]. 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 Per records from [facility] in [place], [place]    H/O calculus of kidney during pregnancy    Lynden Ang, MD   H/O hysterectomy for benign disease    Lynden Ang, MD   History of fibula fracture    Right, Per records from [facility] in [place], [place]    History of torn meniscus of right knee    Per records from [facility] in [place], [place]    Humerus fracture    left leg- due to fall   Humerus fracture    Resulting from a fall, Per records from [facility] in [place], [place]    Hypercalcemia    Per records from [facility] in [place], [place]    Hypertension    Per [facility] New Patient packet    Kidney stone on left side    Per records from [facility] in [place], [place]    Lactose intolerance    Per [facility] New Patient packet    Low back pain 03/24/2022   Multiple actinic keratoses    Lynden Ang, MD   Myalgia due to statin    Per records from [facility] in [place], [place]. Due to crestor    Non-toxic multinodular goiter 01/21/2017   Lynden Ang, MD   Normocytic anemia    Lynden Ang, MD    Obstructive sleep apnea syndrome    Per records from [facility] in [place], [place]. Patient did not tolerate CPAP machine    Onychomycosis    Per records from [facility] in [place], [place]    Osteoarthritis    Per [facility] New Patient packet    Osteopenia    Per records from [facility] in [place], [place]. Treated in the past with antiresorptive therapy   Renal cyst 10/11/2012   Simple on CT Scan 10/2012. 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   Lynden Ang , MD   Umbilical hernia  Per [facility] New Patient packet    Vitamin D deficiency    Lynden Ang, MD   Past Surgical History:  Procedure Laterality Date   ABDOMINAL HYSTERECTOMY  1989   One ovary, Per [facility] New Patient packet    ABLATION  1996   Radical, Per [facility] New Patient packet    BREAST CYST ASPIRATION Bilateral    numerous, over several years per pt, always benign   COLONOSCOPY  2019   Dr.Melissa Fayrene Fearing, Per [facility] New Patient packet    KNEE ARTHROSCOPY W/ MENISCAL REPAIR  2002   Per [facility] New Patient packet    MOUTH SURGERY     Dental Implant    REPLACEMENT TOTAL KNEE Left 2015   Per [facility] New Patient packet   RIGHT/LEFT HEART CATH AND CORONARY ANGIOGRAPHY N/A 02/07/2018   Procedure: RIGHT/LEFT HEART CATH AND CORONARY ANGIOGRAPHY;  Surgeon: Swaziland, Peter M, MD;  Location: [facility] INVASIVE CV LAB;  Service: Cardiovascular;  Laterality: N/A;   TONSILLECTOMY  1951   Per [facility] New Patient packet    TOOTH EXTRACTION  01/12/2019   TOOTH EXTRACTION  06/05/2019   Preparing for dental implant    Family History  Problem Relation Age of Onset   Congestive Heart Failure Mother    Diabetes Mother 40   Arthritis Mother 63   Stroke Father    Dementia Father    Diabetes Father    Hyperlipidemia Father    Alzheimer's disease Father 95   Diabetes Sister    Cancer Maternal Grandfather    Diabetes Maternal Grandfather 79   CVA Maternal  Grandfather    AAA (abdominal aortic aneurysm) Sister    Congenital heart disease Daughter    Heart attack Maternal Aunt    Lung cancer Maternal Grandmother 89   Breast cancer Paternal Aunt    Social History   Socioeconomic History   Marital status: Married    Spouse name: Not on file   Number of children: Not on file   Years of education: Not on file   Highest education level: Bachelor's degree (e.g., BA, AB, BS)  Occupational History   Not on file  Tobacco Use   Smoking status: Never   Smokeless tobacco: Never  Vaping Use   Vaping status: Never Used  Substance and Sexual Activity   Alcohol use: Yes    Comment: 2-3 a month - social   Drug use: Never   Sexual activity: Not Currently  Other Topics Concern   Not on file  Social History Narrative   Per [facility] New Patient Packet 12/06/17:      Diet: Low fat, dairy free      Caffeine: Yes      Married, if yes what year: Married, 1971      Do you live in a house, apartment, assisted living, condo, trailer, ect: Condo, 2 stories, 2 persons       Pets: No      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 No   Do you have difficulty preparing food or eating? No   Do you have difficulty managing your medications? No   Do you have difficulty managing your finances? No  Do you have difficulty affording your medications? No   Social Drivers of Corporate investment banker Strain: Low Risk  (03/22/2023)   Overall Financial Resource Strain (CARDIA)    Difficulty of Paying Living Expenses: Not hard at all  Food Insecurity: No Food Insecurity (03/22/2023)   Hunger Vital Sign    Worried About Running Out of Food in the Last Year: Never true    Ran Out of Food in the Last Year: Never true   Transportation Needs: No Transportation Needs (03/22/2023)   PRAPARE - Administrator, Civil Service (Medical): No    Lack of Transportation (Non-Medical): No  Physical Activity: Insufficiently Active (03/22/2023)   Exercise Vital Sign    Days of Exercise per Week: 3 days    Minutes of Exercise per Session: 40 min  Stress: No Stress Concern Present (03/22/2023)   [employer] of Occupational Health - Occupational Stress Questionnaire    Feeling of Stress : Not at all  Social Connections: Moderately Integrated (03/22/2023)   Social Connection and Isolation Panel [NHANES]    Frequency of Communication with Friends and Family: Twice a week    Frequency of Social Gatherings with Friends and Family: Once a week    Attends Religious Services: Never    Database administrator or Organizations: Yes    Attends Engineer, structural: More than 4 times per year    Marital Status: Married    Tobacco Counseling Counseling given: Not Answered   Clinical Intake:  Pre-visit preparation completed: Yes  Pain : No/denies pain     Diabetes: No  How often do you need to have someone help you when you read instructions, pamphlets, or other written materials from your doctor or pharmacy?: 1 - Never  Interpreter Needed?: No  Information entered by :: Remi Haggard LPN   Activities of Daily Living    03/18/2023    1:52 PM  In your present state of health, do you have any difficulty performing the following activities:  Hearing? 0  Vision? 0  Difficulty concentrating or making decisions? 0  Walking or climbing stairs? 1  Dressing or bathing? 0  Doing errands, shopping? 0  Preparing Food and eating ? N  Using the Toilet? N  In the past six months, have you accidently leaked urine? N  Do you have problems with loss of bowel control? N  Managing your Medications? N  Managing your Finances? N  Housekeeping or managing your Housekeeping? N    Patient Care  Team: Hilbert Bible, FNP as PCP - General (Family Medicine) [name], MD as PCP - Cardiology (Cardiology) Zebedee Iba, PT as Physical Therapist (Physical Therapy) Manning Charity, OD as Referring Physician (Optometry)  Indicate any recent Medical Services you may have received from other than [facility] providers in the past year (date may be approximate).     Assessment:   This is a routine wellness examination for Coda.  Hearing/Vision screen Hearing Screening - Comments:: Some trouble hearing Does not wear hear aids Vision Screening - Comments:: [facility]  Up to date    Goals Addressed               This Visit's Progress     Exercise 159min/wk light-Moderate Activity   Not on track     Plans to start exercise (plans to discuss with cardiologist)      Increase physical activity (pt-stated)   Not on track     Lose weight.  Weight (lb) < 200 lb (90.7 kg)         Depression Screen    03/22/2023    3:18 PM 01/31/2023    2:12 PM 10/15/2022    1:09 PM 03/16/2022    3:39 PM 11/07/2020    3:56 PM 03/24/2020    2:22 PM 03/19/2019    2:33 PM  PHQ 2/9 Scores  PHQ - 2 Score 1 0 0 0 0 0 0  PHQ- 9 Score 1 0 0        Fall Risk    03/22/2023    3:11 PM 03/18/2023    1:52 PM 01/31/2023    2:12 PM 10/15/2022    1:09 PM 03/16/2022    3:43 PM  Fall Risk   Falls in the past year? 0 0 0 0 1  Number falls in past yr: 0 0 0  0  Injury with Fall? 0 0 0  0  Comment     No medical attention required  Risk for fall due to :   No Fall Risks  No Fall Risks  Follow up Falls evaluation completed;Education provided;Falls prevention discussed  Falls evaluation completed  Falls prevention discussed    MEDICARE RISK AT HOME: Medicare Risk at Home Any stairs in or around the home?: Yes If so, are there any without handrails?: No Home free of loose throw rugs in walkways, pet beds, electrical cords, etc?: Yes Adequate lighting in your home to reduce risk of  falls?: Yes Life alert?: No Use of a cane, walker or w/c?: No Grab bars in the bathroom?: Yes Shower chair or bench in shower?: Yes Elevated toilet seat or a handicapped toilet?: Yes  TIMED UP AND GO:  Was the test performed?  {[phone]}    Cognitive Function:    03/19/2019    2:33 PM 02/15/2018    1:16 PM  MMSE - Mini Mental State Exam  Orientation to time 5 5  Orientation to Place 5 5  Registration 3 3  Attention/ Calculation 5 5  Recall 3 3  Language- name 2 objects 2 2  Language- repeat 1 1  Language- follow 3 step command 3 1  Language- read & follow direction 1 1  Write a sentence 1   Copy design 1 1  Total score 30         03/22/2023    3:16 PM 11/07/2020    3:56 PM  6CIT Screen  What Year? 0 points 0 points  What month? 0 points 0 points  What time? 0 points 0 points  Count back from 20 0 points 0 points  Months in reverse 0 points 0 points  Repeat phrase 0 points 0 points  Total Score 0 points 0 points    Immunizations Immunization History  Administered Date(s) Administered   Fluad Quad(high Dose 65+) 09/14/2018, 09/24/2019, 09/26/2020, 10/21/2021   Fluad Trivalent(High Dose 65+) 10/21/2022   Influenza, High Dose Seasonal PF 10/31/2017   Influenza, Quadrivalent, Recombinant, Inj, Pf 10/11/2016   Influenza,inj,quad, With Preservative 10/11/2016   PFIZER(Purple Top)SARS-COV-2 Vaccination 01/30/2019, 02/19/2019, 11/06/2019, 04/23/2020   Pfizer Covid-19 Vaccine Bivalent Booster 72yrs & up 10/01/2020   Pfizer(Comirnaty)Fall Seasonal Vaccine 12 years and older 10/31/2021, 10/21/2022   Pneumococcal Conjugate-13 11/20/2013   Pneumococcal Polysaccharide-23 11/13/2012   Respiratory Syncytial Virus Vaccine,Recomb Aduvanted(Arexvy) 03/07/2023   Tdap 11/27/2007, 07/05/2018   Zoster Recombinant(Shingrix) 05/08/2016, 08/14/2016    {TDAP status:2101805}  {Flu Vaccine status:2101806}  {Pneumococcal vaccine status:2101807}  {Covid-19 vaccine  status:2101808}  Qualifies for Shingles Vaccine? {YES/NO:21197}  Zostavax completed {YES/NO:21197}  {Shingrix Completed?:2101804}  Screening Tests Health Maintenance  Topic Date Due   COVID-19 Vaccine (8 - 2024-25 season) 12/16/2022   Fecal DNA (Cologuard)  04/02/2023   Medicare Annual Wellness (AWV)  03/21/2024   DTaP/Tdap/Td (3 - Td or Tdap) 07/04/2028   Pneumonia Vaccine 74+ Years old  Completed   INFLUENZA VACCINE  Completed   DEXA SCAN  Completed   Hepatitis C Screening  Completed   Zoster Vaccines- Shingrix  Completed   HPV VACCINES  Aged Out   Colonoscopy  Discontinued    Health Maintenance  Health Maintenance Due  Topic Date Due   COVID-19 Vaccine (8 - 2024-25 season) 12/16/2022    {Colorectal cancer screening:2101809}  {Mammogram status:21018020}  {Bone Density status:21018021}  Lung Cancer Screening: (Low Dose CT Chest recommended if Age 18-80 years, 20 pack-year currently smoking OR have quit w/in 15years.) {DOES NOT does:27190::"does not"} qualify.   Lung Cancer Screening Referral: ***  Additional Screening:  Hepatitis C Screening: {DOES NOT does:27190::"does not"} qualify; Completed ***  Vision Screening: Recommended annual ophthalmology exams for early detection of glaucoma and other disorders of the eye. Is the patient up to date with their annual eye exam?  {YES/NO:21197} Who is the provider or what is the name of the office in which the patient attends annual eye exams? *** If pt is not established with a provider, would they like to be referred to a provider to establish care? {YES/NO:21197}.   Dental Screening: Recommended annual dental exams for proper oral hygiene  Diabetic Foot Exam: {Diabetic Foot Exam:2101802}  Community Resource Referral / Chronic Care Management: CRR required this visit?  {YES/NO:21197}  CCM required this visit?  {CCM Required choices:[phone]}     Plan:     I have personally reviewed and noted the following in  the patient's chart:   Medical and social history Use of alcohol, tobacco or illicit drugs  Current medications and supplements including opioid prescriptions. {Opioid [phone]} Functional ability and status Nutritional status Physical activity Advanced directives List of other physicians Hospitalizations, surgeries, and ER visits in previous 12 months Vitals Screenings to include cognitive, depression, and falls Referrals and appointments  In addition, I have reviewed and discussed with patient certain preventive protocols, quality metrics, and best practice recommendations. A written personalized care plan for preventive services as well as general preventive health recommendations were provided to patient.     Remi Haggard, LPN   1/61/0960   After Visit Summary: {CHL AMB AWV After Visit [phone]}  Nurse Notes: ***

## 2023-03-31 ENCOUNTER — Other Ambulatory Visit (HOSPITAL_BASED_OUTPATIENT_CLINIC_OR_DEPARTMENT_OTHER): Payer: Self-pay

## 2023-04-04 ENCOUNTER — Ambulatory Visit (HOSPITAL_BASED_OUTPATIENT_CLINIC_OR_DEPARTMENT_OTHER): Payer: Medicare Other | Admitting: Pulmonary Disease

## 2023-04-04 DIAGNOSIS — J42 Unspecified chronic bronchitis: Secondary | ICD-10-CM

## 2023-04-04 LAB — PULMONARY FUNCTION TEST
DL/VA % pred: 128 %
DL/VA: 5.39 ml/min/mmHg/L
DLCO cor % pred: 104 %
DLCO cor: 18.33 ml/min/mmHg
DLCO unc % pred: 104 %
DLCO unc: 18.33 ml/min/mmHg
FEF 25-75 Post: 1.27 L/s
FEF 25-75 Pre: 1.17 L/s
FEF2575-%Change-Post: 8 %
FEF2575-%Pred-Post: 83 %
FEF2575-%Pred-Pre: 77 %
FEV1-%Change-Post: 2 %
FEV1-%Pred-Post: 67 %
FEV1-%Pred-Pre: 66 %
FEV1-Post: 1.27 L
FEV1-Pre: 1.24 L
FEV1FVC-%Change-Post: 0 %
FEV1FVC-%Pred-Pre: 107 %
FEV6-%Change-Post: 2 %
FEV6-%Pred-Post: 65 %
FEV6-%Pred-Pre: 64 %
FEV6-Post: 1.57 L
FEV6-Pre: 1.53 L
FEV6FVC-%Pred-Post: 105 %
FEV6FVC-%Pred-Pre: 105 %
FVC-%Change-Post: 2 %
FVC-%Pred-Post: 62 %
FVC-%Pred-Pre: 61 %
FVC-Post: 1.57 L
FVC-Pre: 1.54 L
Post FEV1/FVC ratio: 81 %
Post FEV6/FVC ratio: 100 %
Pre FEV1/FVC ratio: 81 %
Pre FEV6/FVC Ratio: 100 %
RV % pred: 224 %
RV: 4.87 L
TLC % pred: 140 %
TLC: 6.57 L

## 2023-04-04 NOTE — Progress Notes (Signed)
 Full PFT Performed Today

## 2023-04-04 NOTE — Patient Instructions (Signed)
 Full PFT Performed Today

## 2023-04-14 ENCOUNTER — Other Ambulatory Visit (HOSPITAL_BASED_OUTPATIENT_CLINIC_OR_DEPARTMENT_OTHER): Payer: Self-pay

## 2023-04-14 ENCOUNTER — Other Ambulatory Visit: Payer: Self-pay | Admitting: Cardiology

## 2023-04-14 DIAGNOSIS — I482 Chronic atrial fibrillation, unspecified: Secondary | ICD-10-CM

## 2023-04-14 MED ORDER — RIVAROXABAN 20 MG PO TABS
20.0000 mg | ORAL_TABLET | Freq: Every day | ORAL | 1 refills | Status: DC
  Filled 2023-04-14: qty 90, 90d supply, fill #0
  Filled 2023-07-15 – 2023-07-18 (×2): qty 30, 30d supply, fill #1
  Filled 2023-09-12: qty 30, 30d supply, fill #2
  Filled 2023-10-27: qty 30, 30d supply, fill #3

## 2023-04-14 NOTE — Telephone Encounter (Signed)
 Prescription refill request for Xarelto received.  Indication: afib  Last office visit: Jennifer Mcclain 12/03/2022 Weight: 89.9 kg  Age: 78 yo  Scr: 1.03 09/22/2022 CrCl: 67 ml/min   Refill sent.

## 2023-04-16 ENCOUNTER — Other Ambulatory Visit (HOSPITAL_BASED_OUTPATIENT_CLINIC_OR_DEPARTMENT_OTHER): Payer: Self-pay

## 2023-04-19 ENCOUNTER — Other Ambulatory Visit (HOSPITAL_BASED_OUTPATIENT_CLINIC_OR_DEPARTMENT_OTHER): Payer: Self-pay

## 2023-04-19 ENCOUNTER — Encounter (HOSPITAL_BASED_OUTPATIENT_CLINIC_OR_DEPARTMENT_OTHER): Payer: Self-pay | Admitting: Pulmonary Disease

## 2023-04-19 ENCOUNTER — Ambulatory Visit (HOSPITAL_BASED_OUTPATIENT_CLINIC_OR_DEPARTMENT_OTHER): Payer: Medicare Other | Admitting: Pulmonary Disease

## 2023-04-19 VITALS — BP 120/76 | HR 101 | Ht 61.5 in | Wt 197.1 lb

## 2023-04-19 DIAGNOSIS — R0602 Shortness of breath: Secondary | ICD-10-CM

## 2023-04-19 DIAGNOSIS — J42 Unspecified chronic bronchitis: Secondary | ICD-10-CM | POA: Diagnosis not present

## 2023-04-19 MED ORDER — FLUTICASONE FUROATE-VILANTEROL 200-25 MCG/ACT IN AEPB
1.0000 | INHALATION_SPRAY | Freq: Every day | RESPIRATORY_TRACT | 5 refills | Status: DC
  Filled 2023-04-19: qty 60, 30d supply, fill #0
  Filled 2023-05-12: qty 60, 30d supply, fill #1
  Filled 2023-06-13: qty 60, 30d supply, fill #2
  Filled 2023-07-13: qty 60, 30d supply, fill #3
  Filled 2023-08-08: qty 60, 30d supply, fill #4
  Filled 2023-09-07: qty 60, 30d supply, fill #5

## 2023-04-19 NOTE — Patient Instructions (Signed)
 Chronic bronchitis Emphysema on CXR however no COPD on PFTs --Pulmonary function test reviewed. No obstructive defect however air trapping seen. --INCREASE Breo 200-25 ONE inhalation daily. Rinse out mouth after use --OK to use albuterol AS NEEDED for shortness of breath and wheezing  Shortness of breath Family member + lung cancer, grandmother Emphysema on CXR --ORDER CT chest without contrast

## 2023-04-19 NOTE — Progress Notes (Signed)
 Subjective:   PATIENT ID: Jennifer Mcclain GENDER: female DOB: May 23, 1945, MRN: [id number]  Chief Complaint  Patient presents with   Follow-up    Chronic bronchitis    Reason for Visit: Follow-up emphysema  [name] is a 78 year old female never smoker with atrial fibrillation on Xarelto, HTN, HLD, allergic rhinitis who presents for follow-up for emphysema.   Initial consult She reports she had a bronchial infection in December and was told she had emphysema on CXR. Never had this diagnosis before. Reports history of allergy of dust and allergy. Denies childhood asthma symptoms or diagnosis. She was given Symbicort two puffs BID and felt symptoms improved including cough. Before using this inhaler she has daily phlegm production for a few years and maybe associated with wheeze but rarely. Denies recurrent infections until the last few years. Has nasal congestion. Previously engaged in cheerleading, aerobic dance classes and currently in water exercise classes.   04/19/23 Since our last visit patient reports with decreased Breo dosing she has noticed that she has a more difficult time taking a breath with possibly increased chest congestion and wheeze. Denies shortness of breath or cough.   Social History: Second hand exposure during childhood Previously worked in Aeronautical engineer duties   Past Medical History:  Diagnosis Date   Achilles tendinitis    Per [facility] New Patient packet    Atrial fibrillation [facility])    Per records from [facility] in [place], [place]    Baker's cyst of knee, left    with resulting lower extremity edema   Benign cyst of breast, left    Per records from [facility] in [place], [place]. 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 Per records from [facility] in [place], [place]    H/O calculus of kidney during pregnancy    Jennifer Ang, MD   H/O hysterectomy for benign disease    Jennifer Ang, MD   History of fibula fracture    Right, Per records from [facility] in [place], [place]    History of torn meniscus of right knee    Per records from [facility] in [place], [place]    Humerus fracture    left leg- due to fall   Humerus fracture    Resulting from a fall, Per records from [facility] in [place], [place]    Hypercalcemia    Per records from [facility] in [place], [place]    Hypertension    Per [facility] New Patient packet    Kidney stone on left side    Per records from [facility] in [place], [place]    Lactose intolerance    Per [facility] New Patient packet    Low back pain 03/24/2022  Multiple actinic keratoses    Jennifer Ang, MD   Myalgia due to statin    Per records from [facility] in [place], [place]. Due to crestor    Non-toxic multinodular goiter 01/21/2017   Jennifer Ang, MD   Normocytic anemia    Jennifer Ang, MD   Obstructive sleep apnea syndrome    Per records from [facility] in [place], [place]. Patient did not tolerate CPAP machine    Onychomycosis    Per records from [facility] in [place], [place]    Osteoarthritis    Per [facility] New Patient packet    Osteopenia    Per records from [facility] in [place], [place]. Treated in the past with antiresorptive therapy   Renal cyst 10/11/2012   Simple on CT Scan 10/2012. 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   Jennifer Mcclain , MD   Umbilical hernia    Per [facility] New Patient packet    Vitamin D deficiency    Jennifer Ang, MD     Family History  Problem Relation Age of Onset   Congestive Heart Failure Mother    Diabetes Mother 13   Arthritis Mother 71   Stroke Father    Dementia Father    Diabetes Father    Hyperlipidemia Father    Alzheimer's disease Father 13   Diabetes Sister    Cancer Maternal Grandfather    Diabetes Maternal Grandfather 51   CVA Maternal Grandfather    AAA (abdominal aortic aneurysm) Sister    Congenital heart disease Daughter    Heart attack Maternal Aunt    Lung cancer Maternal Grandmother 2   Breast cancer Paternal Aunt      Social History   Occupational History   Not on file  Tobacco Use   Smoking status: Never   Smokeless tobacco: Never  Vaping Use   Vaping status: Never Used  Substance and Sexual Activity   Alcohol use: Yes    Comment: 2-3 a month - social   Drug use: Never   Sexual activity: Not Currently    Allergies  Allergen Reactions   Latex Rash and Other (See Comments)    welps   Adhesive [Tape]     Skin irritation    Grass Extracts [Gramineae Pollens]      Per records from from [facility]   Lactose Intolerance (Gi) Other (See Comments)    Unknown   Naprosyn [Naproxen] Hives   Coq10 [Coenzyme Q10] Other (See Comments)    Cramps    Zocor [Simvastatin] Other (See Comments)    cramps     Outpatient Medications Prior to Visit  Medication Sig Dispense Refill   acetaminophen (TYLENOL) 650 MG CR tablet Take 650 mg by mouth at bedtime as needed for pain.     albuterol (VENTOLIN HFA) 108 (90 Base) MCG/ACT inhaler Inhale 2 puffs into the lungs every 6 (six) hours as needed for wheezing or shortness of breath. 6.7 g 2   Calcium Carb-Cholecalciferol (CALCIUM 600/VITAMIN D3 PO) Take 1 tablet by mouth 2 (two) times daily.      Loratadine (CLARITIN PO) Take by mouth daily.     metoprolol succinate (TOPROL-XL) 50 MG 24 hr tablet Take 2 tablets (100 mg total) by mouth in the morning AND 1 tablet (50 mg total) every evening. 270 tablet 3   Multiple Vitamins-Minerals (OCUVITE PO) Take 1 tablet by mouth daily.  polycarbophil (FIBERCON) 625 MG tablet Take 625 mg by mouth daily.     Probiotic Product (PHILLIPS COLON HEALTH PO) Take 1 capsule by mouth daily.      rivaroxaban (XARELTO) 20 MG TABS tablet Take 1 tablet (20 mg total) by mouth daily with supper. 90 tablet 1   rosuvastatin (CRESTOR) 5 MG tablet Take 1 tablet (5 mg total) by mouth 4 (four) times a week. 16 tablet 11   sacubitril-valsartan (ENTRESTO) 49-51 MG Take 1 tablet by mouth 2 (two) times daily. 180 tablet 3   fluticasone furoate-vilanterol (BREO ELLIPTA) 100-25 MCG/ACT AEPB Inhale 1 puff into the lungs daily. 60 each 11   triamcinolone (KENALOG) 0.025 % cream Apply 1 Application topically 2 (two) times daily as needed (eczema). (Patient not taking: Reported on 04/19/2023) 30 g 0   No facility-administered medications prior to visit.    Review of Systems  Constitutional:  Negative for chills, diaphoresis, fever, malaise/fatigue and weight loss.  HENT:  Negative for congestion.   Respiratory:  Negative for cough, hemoptysis, sputum production, shortness of breath and wheezing.   Cardiovascular:  Negative for chest pain, palpitations and leg swelling.     Objective:   Vitals:   04/19/23 1433  BP: 120/76  Pulse: (!) 101  SpO2: 97%  Weight: 197 lb 1.6 oz (89.4 kg)  Height: 5' 1.5" (1.562 m)    SpO2: 97 %  Physical Exam: General: Well-appearing, no acute distress HENT: [place], AT Eyes: EOMI, no scleral icterus Respiratory: Clear to auscultation bilaterally.  No crackles, wheezing or rales Cardiovascular: RRR, -M/R/G, no JVD Extremities:-Edema,-tenderness Neuro: AAO x4, CNII-XII grossly intact Psych: Normal mood, normal affect  Data  Reviewed:  Imaging: CXR 12/28/22 - Hyperinflation, mild cardiomegaly, unchanged compared to prior  PFT: 04/04/23 FVC 1.57 (62%) FEV1 1.27 (67%) Ratio 81  TLC 140% TV 224% RV/TLC 162% DLCO 104%. No significant bronchodilator response Interpretation: No obstructive or restrictive defect. Hyperinflation and air trapping present but unclear clinical significance in absence of  obstructive defect   Labs: CBC    Component Value Date/Time   WBC 4.8 12/03/2022 1155   WBC 4.0 10/09/2021 1620   RBC 4.16 12/03/2022 1155   RBC 4.04 10/09/2021 1620   HGB 13.4 12/03/2022 1155   HCT 41.0 12/03/2022 1155   PLT 170 12/03/2022 1155   MCV 99 (H) 12/03/2022 1155   MCH 32.2 12/03/2022 1155   MCH 32.4 10/09/2021 1620   MCHC 32.7 12/03/2022 1155   MCHC 33.2 10/09/2021 1620   RDW 12.5 12/03/2022 1155   LYMPHSABS 1.4 03/24/2022 1437   MONOABS 0.5 10/09/2021 1620   EOSABS 0.1 03/24/2022 1437   BASOSABS 0.0 03/24/2022 1437   Absolute eos 03/24/22 - 100     Assessment & Plan:   Discussion: 78 year old female never smoker with atrial fibrillation on Xarelto, HTN, HLD, allergic rhinitis who presents for follow-up for emphysema. In a never smoker her hyperinflation noted on CXR is likely related to untreated asthma rather than COPD. PFTs reviewed and do not demonstrate obstructive defect in spirometry however noted to have hyperinflation and significant air trapping. Has had good response to ICS/LABA so would continue this. Will also order CT imaging to confirm emphysema and rule out parenchymal disease.  History of environmental exposures significant for second hand smoke and yarn otherwise minimal. Suspect she may have allergic type asthma/asthmatic bronchitis based on allergies (grass etc) and good response to her current ICS/LABA (Symbicort). Discussed clinical course and management of asthma including bronchodilator  regimen, preventive care and action plan for exacerbation.  Chronic  bronchitis Emphysema on CXR however no COPD on PFTs --Pulmonary function test reviewed. No obstructive defect however air trapping and hyperinflation seen. --INCREASE Breo 200-25 ONE inhalation daily. Rinse out mouth after use --OK to use albuterol AS NEEDED for shortness of breath and wheezing  Shortness of breath Family member + lung cancer, grandmother Emphysema on CXR --ORDER CT chest without contrast. Will call with results  Health Maintenance Immunization History  Administered Date(s) Administered   Fluad Quad(high Dose 65+) 09/14/2018, 09/24/2019, 09/26/2020, 10/21/2021   Fluad Trivalent(High Dose 65+) 10/21/2022   Influenza, High Dose Seasonal PF 10/31/2017   Influenza, Quadrivalent, Recombinant, Inj, Pf 10/11/2016   Influenza,inj,quad, With Preservative 10/11/2016   PFIZER(Purple Top)SARS-COV-2 Vaccination 01/30/2019, 02/19/2019, 11/06/2019, 04/23/2020   Pfizer Covid-19 Vaccine Bivalent Booster 53yrs & up 10/01/2020   Pfizer(Comirnaty)Fall Seasonal Vaccine 12 years and older 10/31/2021, 10/21/2022   Pneumococcal Conjugate-13 11/20/2013   Pneumococcal Polysaccharide-23 11/13/2012   Respiratory Syncytial Virus Vaccine,Recomb Aduvanted(Arexvy) 03/07/2023   Tdap 11/27/2007, 07/05/2018   Zoster Recombinant(Shingrix) 05/08/2016, 08/14/2016   CT Lung Screen - not qualified  Orders Placed This Encounter  Procedures   CT Chest Wo Contrast    Standing Status:   Future    Expiration Date:   04/18/2024    Preferred imaging location?:   [facility]   Meds ordered this encounter  Medications   fluticasone furoate-vilanterol (BREO ELLIPTA) 200-25 MCG/ACT AEPB    Sig: Inhale 1 puff into the lungs daily.    Dispense:  60 each    Refill:  5    Return in about 6 months (around 10/19/2023).  I have spent a total time of 35-minutes on the day of the appointment including chart review, data review, collecting history, coordinating care and discussing medical diagnosis and plan  with the patient/family. Past medical history, allergies, medications were reviewed. Pertinent imaging, labs and tests included in this note have been reviewed and interpreted independently by me.  Feven Alderfer Mechele Collin, MD [facility] 04/19/2023 2:50 PM

## 2023-04-22 ENCOUNTER — Ambulatory Visit (HOSPITAL_BASED_OUTPATIENT_CLINIC_OR_DEPARTMENT_OTHER)
Admission: RE | Admit: 2023-04-22 | Discharge: 2023-04-22 | Disposition: A | Discharge status: Home / Self Care | Source: Ambulatory Visit | Attending: Pulmonary Disease | Admitting: Pulmonary Disease

## 2023-04-22 DIAGNOSIS — I700 Atherosclerosis of aorta: Secondary | ICD-10-CM | POA: Diagnosis not present

## 2023-04-22 DIAGNOSIS — R0602 Shortness of breath: Secondary | ICD-10-CM | POA: Diagnosis not present

## 2023-05-06 ENCOUNTER — Encounter (HOSPITAL_BASED_OUTPATIENT_CLINIC_OR_DEPARTMENT_OTHER): Payer: Self-pay | Admitting: Pulmonary Disease

## 2023-05-09 ENCOUNTER — Ambulatory Visit (HOSPITAL_BASED_OUTPATIENT_CLINIC_OR_DEPARTMENT_OTHER): Payer: Medicare Other | Admitting: Family

## 2023-05-09 ENCOUNTER — Encounter (HOSPITAL_BASED_OUTPATIENT_CLINIC_OR_DEPARTMENT_OTHER): Payer: Self-pay | Admitting: Family

## 2023-05-09 VITALS — BP 110/66 | HR 92 | Ht 61.5 in | Wt 192.6 lb

## 2023-05-09 DIAGNOSIS — I2510 Atherosclerotic heart disease of native coronary artery without angina pectoris: Secondary | ICD-10-CM | POA: Diagnosis not present

## 2023-05-09 DIAGNOSIS — E785 Hyperlipidemia, unspecified: Secondary | ICD-10-CM

## 2023-05-09 DIAGNOSIS — I482 Chronic atrial fibrillation, unspecified: Secondary | ICD-10-CM

## 2023-05-09 DIAGNOSIS — I428 Other cardiomyopathies: Secondary | ICD-10-CM

## 2023-05-09 DIAGNOSIS — D6859 Other primary thrombophilia: Secondary | ICD-10-CM

## 2023-05-09 NOTE — Progress Notes (Signed)
 Cardiology Office Note:  .   Date:  05/09/2023  ID:  Jennifer Mcclain, DOB 1945-08-02, MRN [id number] PCP: Nonda Bays, FNP  [facility] Providers Cardiologist:  Sheryle Donning, MD    History of Present Illness: .   Jennifer Mcclain is a 78 y.o. female with a history of permanent atrial fibrillation on DOAC, nonischemic cardiomyopathy covered LVEF, hyperlipidemia, hypertension, minimal nonobstructive CAD.  Initially established Dr. Veryl Gottron January 2020 for atrial fibrillation.  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  Moved to [place] approximately 10 years ago and was told no evidence of WPW.  Her daughter passed from heart disease with a "kink "in one of her arteries at age 65.  Echo 01/2018 LVEF 35-40%, moderate MR, moderate to severe TR, mild PR. Cardiac catheterization 01/2020 with minimal nonobstructive CAD.  Most recent echo 05/2018 LVEF 55 to 60%, mild to moderate MR. Monitorx2 with 100% atrial fibrillation which is now permanent based on preference for rate control only.  Seen 06/01/2022 doing overall well from a cardiac perspective with NYHA II symptoms.  Entresto , metoprolol  succinate, rosuvastatin  4 times per week were continued.  At visit 12/03/22 no changes were made, recommended to follow up in 6 months. Given information on Zetia to consider.   Seen by Dr. Washington Hacker of pulmonology 04/19/23 PFT with no obstructive defect however air trapping and hyperinflation seen. Breo increased. CT chest 05/06/23 with no pulmonary parenchymal findings, aortic atherosclerosis, coronary artery calcification, enlarged right and left pulmonary arteries suggestive of pulmonary arterial hypertension   Presents today for follow up overall feeling well. Notes some stress as her husband was recently hospitalized and undergoing workup for valve replacement.  Recently returned to water aerobics this week and tolerated well. Exertional dyspnea much improved since increased dose Breo. No chest pain, edema, Rare instances of palpitations related to her atrial fibrillation which are not bothersome.   ROS: Please see the history of present illness.    All other systems reviewed and are negative.   Studies Reviewed: Jennifer Mcclain   EKG Interpretation Date/Time:  Monday May 09 2023 15:31:30 EDT Ventricular Rate:  92 PR Interval:    QRS Duration:  102 QT Interval:  320 QTC Calculation: 395 R Axis:   -26  Text Interpretation: Atrial fibrillation (rate controlled) No acute ST/T wave changes. Confirmed by Neomi Banks ([id number]) on 05/09/2023 3:37:11 PM       Risk Assessment/Calculations:    CHA2DS2-VASc Score = 6   This indicates a 9.7% annual risk of stroke. The patient's score is based upon: CHF History: 1 HTN History: 1 Diabetes History: 0 Stroke History: 0 Vascular Disease History: 1 Age Score: 2 Gender Score: 1            Physical Exam:   VS:  BP 110/66   Pulse 92   Ht 5' 1.5" (1.562 m)   Wt 192 lb 9.6 oz (87.4 kg)   SpO2 98%   BMI 35.80 kg/m    Wt Readings from Last 3 Encounters:  05/09/23 192 lb 9.6 oz (87.4 kg)  04/19/23 197 lb 1.6 oz (89.4 kg)  04/04/23 198 lb 3.2 oz (89.9 kg)    GEN: Well nourished, well developed in no acute distress NECK: No JVD; No carotid bruits CARDIAC: IRIR, no murmurs, rubs, gallops RESPIRATORY:  Clear to auscultation without rales, wheezing or rhonchi  ABDOMEN: Soft, non-tender, non-distended EXTREMITIES:  No edema; No deformity   ASSESSMENT AND PLAN: .    NICM with recovered LVEF - LHC nonobstructive CAD. Euvolemic and well compensated on exam. GDMT Entresto  49-51 mg twice daily, Metoprolol  succinate 100 mg a.m. and 50 mg p.m. No indication for loop diuretic.   Permanent atrial fibrillation / Hypercoagulable state - Rate controlled on Toprol  100mg  AM and 50mg  PM. CHA2DS2-VASc Score = 6 [CHF  History: 1, HTN History: 1, Diabetes History: 0, Stroke History: 0, Vascular Disease History: 1, Age Score: 2, Gender Score: 1].  Therefore, the patient's annual risk of stroke is 9.7 %.    Continue Xarelto  20mg  daily.  Denies bleeding complications  HTN - BP well controlled. Continue current antihypertensive regimen.  Discussed to monitor BP at home at least 2 hours after medications and sitting for 5-10 minutes.   Nonobstructive CAD / HLD, LDL goal <70 - Stable with no anginal symptoms. No indication for ischemic evaluation.  GDMT Metoprolol  100 mg a.m. and 50 mg p.m., Rosuvastatin  5 mg 4x per week. No ASA due to [facility]. 03/2022 LDL 84. Has upcoming follow up with PCP, anticipate repeat labs at that time.  If LDL not at goal plan to add Zetia.  ?PAH -dilated bilateral pulmonary arteries by CT.  Her dyspnea is overall improved.  We discussed possible echocardiogram to assess pulmonary pressures but as her breathing is better she prefers to defer at this time.       Dispo: follow up in 6 months  Signed, Clearnce Curia, NP

## 2023-05-09 NOTE — Patient Instructions (Signed)
 Medication Instructions:  Continue your current medications.   *If you need a refill on your cardiac medications before your next appointment, please call your pharmacy*   Follow-Up: At [facility], you and your health needs are our priority.  As part of our continuing mission to provide you with exceptional heart care, our providers are all part of one team.  This team includes your primary Cardiologist (physician) and Advanced Practice Providers or APPs (Physician Assistants and Nurse Practitioners) who all work together to provide you with the care you need, when you need it.  Your next appointment:   6 month(s)  Provider:   Sheryle Donning, MD or Neomi Banks, NP    We recommend signing up for the patient portal called "MyChart".  Sign up information is provided on this After Visit Summary.  MyChart is used to connect with patients for Virtual Visits (Telemedicine).  Patients are able to view lab/test results, encounter notes, upcoming appointments, etc.  Non-urgent messages can be sent to your provider as well.   To learn more about what you can do with MyChart, go to [email or web address].

## 2023-05-11 ENCOUNTER — Ambulatory Visit (HOSPITAL_BASED_OUTPATIENT_CLINIC_OR_DEPARTMENT_OTHER): Payer: Medicare Other | Admitting: Cardiology

## 2023-05-26 ENCOUNTER — Other Ambulatory Visit (HOSPITAL_BASED_OUTPATIENT_CLINIC_OR_DEPARTMENT_OTHER): Payer: Self-pay

## 2023-07-11 ENCOUNTER — Other Ambulatory Visit (HOSPITAL_BASED_OUTPATIENT_CLINIC_OR_DEPARTMENT_OTHER): Payer: Self-pay

## 2023-07-11 MED ORDER — AMOXICILLIN 500 MG PO CAPS
ORAL_CAPSULE | ORAL | 1 refills | Status: DC
  Filled 2023-07-11: qty 21, 7d supply, fill #0

## 2023-07-18 ENCOUNTER — Other Ambulatory Visit (HOSPITAL_BASED_OUTPATIENT_CLINIC_OR_DEPARTMENT_OTHER): Payer: Self-pay

## 2023-07-20 ENCOUNTER — Encounter: Payer: Self-pay | Admitting: Dermatology

## 2023-07-20 ENCOUNTER — Ambulatory Visit: Payer: 59 | Admitting: Dermatology

## 2023-07-20 VITALS — BP 136/83

## 2023-07-20 DIAGNOSIS — D1801 Hemangioma of skin and subcutaneous tissue: Secondary | ICD-10-CM | POA: Diagnosis not present

## 2023-07-20 DIAGNOSIS — Z808 Family history of malignant neoplasm of other organs or systems: Secondary | ICD-10-CM

## 2023-07-20 DIAGNOSIS — L304 Erythema intertrigo: Secondary | ICD-10-CM

## 2023-07-20 DIAGNOSIS — L820 Inflamed seborrheic keratosis: Secondary | ICD-10-CM

## 2023-07-20 DIAGNOSIS — Z1283 Encounter for screening for malignant neoplasm of skin: Secondary | ICD-10-CM | POA: Diagnosis not present

## 2023-07-20 DIAGNOSIS — W908XXA Exposure to other nonionizing radiation, initial encounter: Secondary | ICD-10-CM | POA: Diagnosis not present

## 2023-07-20 DIAGNOSIS — D229 Melanocytic nevi, unspecified: Secondary | ICD-10-CM

## 2023-07-20 DIAGNOSIS — L814 Other melanin hyperpigmentation: Secondary | ICD-10-CM | POA: Diagnosis not present

## 2023-07-20 DIAGNOSIS — L578 Other skin changes due to chronic exposure to nonionizing radiation: Secondary | ICD-10-CM | POA: Diagnosis not present

## 2023-07-20 DIAGNOSIS — L821 Other seborrheic keratosis: Secondary | ICD-10-CM

## 2023-07-20 NOTE — Patient Instructions (Addendum)

## 2023-07-20 NOTE — Progress Notes (Signed)
 Total Body Skin Exam (TBSE) Visit   Subjective  Jennifer Mcclain is a 78 y.o. female established patient who presents for the following: Skin Cancer Screening and Full Body Skin Exam   Patient was last evaluated for TBSE on 07/20/23. Patient does have spots of concern to be evaluated. She does apply sunscreen and/or wears protective coverings if she's going to be outdoors for prolonged periods of time. Denied Hx of Bx. Reports family Hx of skin cancers (sister - unsure of type).   The patient has spots, moles and lesions to be evaluated, some may be new or changing and the patient has concerns that these could be cancer.  The following portions of the chart were reviewed this encounter and updated as appropriate: medications, allergies, medical history  Review of Systems:  No other skin or systemic complaints except as noted in HPI or Assessment and Plan.  Objective  Well appearing patient in no apparent distress; mood and affect are within normal limits.  A full examination was performed including scalp, head, eyes, ears, nose, lips, neck, chest, axillae, abdomen, back, buttocks, bilateral upper extremities, bilateral lower extremities, hands, feet, fingers, toes, fingernails, and toenails. All findings within normal limits unless otherwise noted below.   Relevant physical exam findings are noted in the Assessment and Plan.  Right Shoulder - Anterior Inflamed SK  Assessment & Plan   LENTIGINES, SEBORRHEIC KERATOSES, HEMANGIOMAS - Benign normal skin lesions - Benign-appearing - Call for any changes  BENIGN MELANOCYTIC NEVI - Tan-brown and/or pink-flesh-colored symmetric macules and papules - Benign appearing on exam today - Observation - Call clinic for new or changing moles - Recommend daily use of broad spectrum spf 30+ sunscreen to sun-exposed areas.   MILD ACTINIC DAMAGE - Chronic condition, secondary to cumulative UV/sun exposure - diffuse scaly erythematous macules with  underlying dyspigmentation - Recommend daily broad spectrum sunscreen SPF 30+ to sun-exposed areas, reapply every 2 hours as needed.  - Staying in the shade or wearing long sleeves, sun glasses (UVA+UVB protection) and wide brim hats (4-inch brim around the entire circumference of the hat) are also recommended for sun protection.  - Call for new or changing lesions.  INTERTRIGO Exam: Erythematous macerated patches in L axilla fold  flared  Intertrigo is a chronic recurrent rash that occurs in skin fold areas that may be associated with friction; heat; moisture; yeast; fungus; and bacteria.  It is exacerbated by increased movement / activity; sweating; and higher atmospheric temperature.  Use of an absorbant powder such as Zeasorb AF powder or other OTC antifungal powder to the area daily can prevent rash recurrence. Other options to help keep the area dry include blow drying the area after bathing or using antiperspirant products such as Duradry sweat minimizing gel.  Treatment Plan: - Recommended applying OTC Hydrocortisone BID until clear.    SKIN CANCER SCREENING PERFORMED TODAY.   INFLAMED SEBORRHEIC KERATOSIS Right Shoulder - Anterior Destruction of lesion - Right Shoulder - Anterior Complexity: simple   Destruction method: cryotherapy   Informed consent: discussed and consent obtained   Timeout:  patient name, date of birth, surgical site, and procedure verified Lesion destroyed using liquid nitrogen: Yes   Post-procedure details: wound care instructions given    Return in about 1 year (around 07/19/2024) for TBSE in 1 yr & seperate OV to discuss hair concerns.   Documentation: I have reviewed the above documentation for accuracy and completeness, and I agree with the above.  I, Shirron Maranda, CMA,  am acting as scribe for Delon Lenis, DO.   Delon Lenis, DO

## 2023-08-01 ENCOUNTER — Ambulatory Visit (HOSPITAL_BASED_OUTPATIENT_CLINIC_OR_DEPARTMENT_OTHER): Payer: Medicare Other | Admitting: Family Medicine

## 2023-08-03 ENCOUNTER — Ambulatory Visit (HOSPITAL_BASED_OUTPATIENT_CLINIC_OR_DEPARTMENT_OTHER): Admitting: Family Medicine

## 2023-08-03 ENCOUNTER — Encounter (HOSPITAL_BASED_OUTPATIENT_CLINIC_OR_DEPARTMENT_OTHER): Payer: Self-pay | Admitting: Family Medicine

## 2023-08-03 VITALS — BP 146/99 | HR 97 | Ht 61.5 in | Wt 194.8 lb

## 2023-08-03 DIAGNOSIS — R040 Epistaxis: Secondary | ICD-10-CM

## 2023-08-03 DIAGNOSIS — Z7901 Long term (current) use of anticoagulants: Secondary | ICD-10-CM

## 2023-08-03 DIAGNOSIS — I10 Essential (primary) hypertension: Secondary | ICD-10-CM | POA: Diagnosis not present

## 2023-08-03 DIAGNOSIS — E785 Hyperlipidemia, unspecified: Secondary | ICD-10-CM | POA: Diagnosis not present

## 2023-08-03 DIAGNOSIS — N182 Chronic kidney disease, stage 2 (mild): Secondary | ICD-10-CM | POA: Diagnosis not present

## 2023-08-03 DIAGNOSIS — R600 Localized edema: Secondary | ICD-10-CM

## 2023-08-03 NOTE — Patient Instructions (Signed)
 Start Compression stockings.   Use vaseline to nasal membranes.

## 2023-08-03 NOTE — Progress Notes (Signed)
 Subjective:   Jennifer Mcclain [facility] August 06, 1945 08/03/2023  Chief Complaint  Patient presents with   Medical Management of Chronic Issues    86-month follow up; states for about 2 weeks she had been having a bloody nose and wonders if it could be due to flonase nasal spray. States she has been having some swelling in ankles, more in right than left.    HPI: Jennifer Mcclain presents today for re-assessment and management of chronic medical conditions.   CAD, A-FIBB: Jennifer Mcclain is a 78 yo female with hx of COPD, A-fibb, CHF who presents for the medical management of hyperlipidemia. She does see Cardiology with most recent visit on 05/09/2023 reviewed by PCP. She is chronically anticoagulated with Xalelto 20mg  and rate controlled with Metoprolol  100mg  XR in AM and 50mg  PM. Denies chest pain, shortness of breath or palpitations. She is currently taking Entresto  49-51mg  BID with recovered LVEF. Not currently taking any diuretic. Recommended to add Zetia if LDL is not at goal per Cardiology in April 2025.   Patient's current HLD regimen is: Crestor  5mg   Patient is  currently taking prescribed medications for HLD.  Denies myalgias.    Lab Results  Component Value Date   CHOL 200 (H) 03/24/2022   HDL 104 03/24/2022   LDLCALC 84 03/24/2022   TRIG 69 03/24/2022   CHOLHDL 1.9 03/24/2022     LOWER EXTREMITY EDEMA:  Patient states she has noticed increase in edema to RLE. She states there is mild pain to RLE that worsens at night. She states it does not improve with exercise. Denies long trips or recent travel. She does not wear compression stocking in the summer. She has hx of arthroscopic repair to right knee in early 2000s. She does monitor her salt intake. Denies hx of DVT/PE. She denies shortness of breath or significant weight gain.     EPISTAXIS:  Patient reports she has noticed increase in nosebleeds in the past several months. She is not sure if Flonase is contributing  to more frequent nosebleeds.  She did decrease her Flonase from two squirts of spray bilaterally  to one dosing and has noticed improvement in frequency of nosebleeds.    The following portions of the patient's history were reviewed and updated as appropriate: past medical history, past surgical history, family history, social history, allergies, medications, and problem list.   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 09/15/2018   Pure hypercholesterolemia 06/14/2018   Coronary artery disease due to lipid rich plaque 03/02/2018   Systolic dysfunction without heart failure 02/02/2018   Nonischemic cardiomyopathy (HCC) 02/02/2018   Nonrheumatic mitral valve regurgitation 02/02/2018   Nonrheumatic tricuspid valve regurgitation 02/02/2018   Osteopenia 02/22/2017   Bilateral sensorineural hearing loss 01/28/2017   Vitamin D  deficiency 01/21/2017   Osteoarthritis of knee 01/21/2017   Non-toxic multinodular goiter 01/21/2017   IFG (impaired fasting glucose) 01/21/2017   Lactose intolerance 01/21/2017   History of hysterectomy for benign disease 01/21/2017   Multiple actinic keratoses 01/21/2017   Normocytic anemia 01/21/2017   Umbilical hernia 01/21/2017   Primary  hypertension 11/30/2016   Dyslipidemia 11/30/2016   Chronic atrial fibrillation (HCC) 11/30/2016   Obstructive sleep apnea syndrome 11/30/2016   Renal cyst 10/11/2012   Past Medical History:  Diagnosis Date   Achilles tendinitis    Per [facility] New Patient packet    Allergy    Atrial fibrillation [facility])    Per records from [facility] in  [place], [place]    Baker's cyst of knee, left    with resulting lower extremity edema   Benign cyst of breast, left    Per records from [facility] in [place], [place]. 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 Per records from [facility] in [place], [place]    H/O calculus of kidney during pregnancy    Eleanor Agent, MD   H/O hysterectomy for benign disease    Eleanor Agent, MD   History of fibula fracture    Right, Per records from [facility] in [place], [place]    History of torn meniscus of right knee    Per records from [facility] in [place], [place]    Humerus fracture    left leg- due to fall   Humerus fracture    Resulting from a fall, Per records from [facility] in [place], [place]    Hypercalcemia    Per records from [facility] in [place], [place]    Hypertension    Per [facility] New Patient packet    Kidney stone on left side    Per records from [facility] in [place], [place]    Lactose intolerance    Per [facility] New Patient packet    Low back pain 03/24/2022   Multiple actinic keratoses    Eleanor Agent, MD   Myalgia due to statin    Per records from [facility] in [place], [place]. Due to crestor     Non-toxic multinodular goiter 01/21/2017   Eleanor Agent, MD   Normocytic anemia     Eleanor Agent, MD   Obstructive sleep apnea syndrome    Per records from [facility] in [place], [place]. Patient did not tolerate CPAP machine    Onychomycosis    Per records from [facility] in [place], [place]    Osteoarthritis    Per [facility] New Patient packet    Osteopenia    Per records from [facility] in [place], [place]. Treated in the past with antiresorptive therapy   Renal cyst 10/11/2012   Simple on CT Scan 10/2012. Per records from [facility] in [place], [place]    Sensorineural hearing loss (SNHL), bilateral 01/28/2017   Eleanor Agent , MD   Sleep apnea Approx 2019   Umbilical hernia    Per [facility]  New Patient packet    Vitamin D  deficiency    Eleanor agent, MD   Past Surgical History:  Procedure Laterality Date   ABDOMINAL HYSTERECTOMY  1989   One ovary, Per [facility] New Patient packet    ABLATION  1996   Radical, Per [facility] New Patient packet    BREAST CYST ASPIRATION Bilateral    numerous, over several years per pt, always benign   COLONOSCOPY  2019   Dr.Melissa agent, Per [facility] New Patient packet    JOINT REPLACEMENT  Approx 2014   KNEE ARTHROSCOPY W/ MENISCAL REPAIR  2002   Per [facility] New Patient packet    MOUTH SURGERY     Dental Implant    REPLACEMENT TOTAL KNEE Left 2015   Per [facility] New Patient packet   RIGHT/LEFT HEART CATH AND CORONARY ANGIOGRAPHY N/A 02/07/2018   Procedure: RIGHT/LEFT HEART CATH AND CORONARY ANGIOGRAPHY;  Surgeon: Swaziland, Peter M, MD;  Location: [facility] INVASIVE CV LAB;  Service: Cardiovascular;  Laterality: N/A;   TONSILLECTOMY  1951   Per [facility] New Patient packet    TOOTH EXTRACTION  01/12/2019   TOOTH EXTRACTION  06/05/2019   Preparing for dental implant    Family History  Problem Relation Age of Onset   Congestive Heart Failure Mother    Diabetes Mother 18   Arthritis Mother 19   Obesity Mother    Stroke Father    Dementia Father    Diabetes Father    Hyperlipidemia Father    Alzheimer's disease Father 64    Diabetes Sister    Cancer Maternal Grandfather    Diabetes Maternal Grandfather 23   CVA Maternal Grandfather    AAA (abdominal aortic aneurysm) Sister    Congenital heart disease Daughter    Asthma Daughter    Birth defects Daughter    Early death Daughter    Heart attack Maternal Aunt    Lung cancer Maternal Grandmother 38   Breast cancer Paternal Aunt    Outpatient Medications Prior to Visit  Medication Sig Dispense Refill   acetaminophen  (TYLENOL ) 650 MG CR tablet Take 650 mg by mouth at bedtime as needed for pain.     albuterol  (VENTOLIN  HFA) 108 (90 Base) MCG/ACT inhaler Inhale 2 puffs into the lungs every 6 (six) hours as needed for wheezing or shortness of breath. 6.7 g 2   Calcium  Carb-Cholecalciferol (CALCIUM  600/VITAMIN D3 PO) Take 1 tablet by mouth 2 (two) times daily.     fluticasone  furoate-vilanterol (BREO ELLIPTA ) 200-25 MCG/ACT AEPB Inhale 1 puff into the lungs daily. 60 each 5   Loratadine (CLARITIN PO) Take by mouth daily.     metoprolol  succinate (TOPROL -XL) 50 MG 24 hr tablet Take 2 tablets (100 mg total) by mouth in the morning AND 1 tablet (50 mg total) every evening. 270 tablet 3   Multiple Vitamins-Minerals (OCUVITE PO) Take 1 tablet by mouth daily.      polycarbophil (FIBERCON) 625 MG tablet Take 625 mg by mouth daily.     Probiotic Product (PHILLIPS COLON HEALTH PO) Take 1 capsule by mouth daily.      rivaroxaban  (XARELTO ) 20 MG TABS tablet Take 1 tablet (20 mg total) by mouth daily with supper. 90 tablet 1   rosuvastatin  (CRESTOR ) 5 MG tablet Take 1 tablet (5 mg total) by mouth 4 (four) times a week. 16 tablet 11   sacubitril -valsartan  (ENTRESTO ) 49-51 MG Take 1 tablet by mouth 2 (two) times daily. 180 tablet 3   triamcinolone  (KENALOG ) 0.025 %  cream Apply 1 Application topically 2 (two) times daily as needed (eczema). 30 g 0   amoxicillin  (AMOXIL ) 500 MG capsule Take 2 capsules by mouth now, and then 1 capsule three times daily until gone. 21 capsule 1   No  facility-administered medications prior to visit.   Allergies  Allergen Reactions   Latex Rash and Other (See Comments)    welps   Adhesive [Tape]     Skin irritation    Grass Extracts [Gramineae Pollens]      Per records from from [facility]   Lactose Intolerance (Gi) Other (See Comments)    Unknown   Naprosyn [Naproxen] Hives   Coq10 [Coenzyme Q10] Other (See Comments)    Cramps    Zocor [Simvastatin] Other (See Comments)    cramps     ROS: A complete ROS was performed with pertinent positives/negatives noted in the HPI. The remainder of the ROS are negative.    Objective:   Today's Vitals   08/03/23 1518  BP: (!) 146/99  Pulse: 97  SpO2: 99%  Weight: 194 lb 12.8 oz (88.4 kg)  Height: 5' 1.5 (1.562 m)    Physical Exam          GENERAL: Well-appearing, in NAD. Well nourished.  SKIN: Pink, warm and dry. No rash, lesion, ulceration, or ecchymoses.  Head: Normocephalic. NECK: Trachea midline. Full ROM w/o pain or tenderness.  NOSE: Septum midline w/o deformity. Nares patent, mucosa pink and non-inflamed w/o drainage. RESPIRATORY: Chest wall symmetrical. Respirations even and non-labored. Breath sounds clear to auscultation bilaterally.  CARDIAC: S1, S2 present, regular rate and rhythm without murmur or gallops. Peripheral pulses 2+ bilaterally.  MSK: Muscle tone and strength appropriate for age. Joints w/o tenderness, redness, or swelling.  EXTREMITIES: Without clubbing, cyanosis. +2 pitting edema present to RLE. No redness or pain. Full ROM present. +3 pedal pulses present with doppler.  NEUROLOGIC: No motor or sensory deficits. Steady, even gait. C2-C12 intact.  PSYCH/MENTAL STATUS: Alert, oriented x 3. Cooperative, appropriate mood and affect.   Health Maintenance Due  Topic Date Due   COVID-19 Vaccine (8 - Pfizer risk 2024-25 season) 04/21/2023       Assessment & Plan:  1. Lower extremity edema (Primary) Concern for possible venous stasis. Will  obtain STAT US  of RLE to rule out DVT, superficial thrombus, venous disease.  - VAS US  LOWER EXTREMITY VENOUS (DVT); Future  2. Epistaxis Recommend reducing dosage of Flonase and use vaseline PRN to nasal membranes.   3. Chronic anticoagulation Stable. Will obtain CBC given long term anticoagulation. No signs of acute bleeding at this time from Xarelto  use.  - CBC with Differential/Platelet  4. CKD (chronic kidney disease) stage 2, GFR 60-89 ml/min Stable. Will check BMP with labwork today. Continue staying well hydrated and diet. Avoidance of NSAID.  - Basic metabolic panel with GFR  5. Dyslipidemia Will check LP with labs today and if LDL is not at goal, will recommend Zetia added on.  - Lipid panel  6. Primary hypertension Controlled with BP log. Continue current regimen.    No orders of the defined types were placed in this encounter.  Lab Orders         Basic metabolic panel with GFR         Lipid panel         CBC with Differential/Platelet     No images are attached to the encounter or orders placed in the encounter.  Return in about 6  months (around 02/03/2024) for ANNUAL PHYSICAL.    Patient to reach out to office if new, worrisome, or unresolved symptoms arise or if no improvement in patient's condition. Patient verbalized understanding and is agreeable to treatment plan. All questions answered to patient's satisfaction.    Thersia Schuyler [place], [place]

## 2023-08-04 ENCOUNTER — Other Ambulatory Visit (HOSPITAL_BASED_OUTPATIENT_CLINIC_OR_DEPARTMENT_OTHER): Payer: Self-pay | Admitting: Family Medicine

## 2023-08-04 ENCOUNTER — Ambulatory Visit (HOSPITAL_COMMUNITY)
Admission: RE | Admit: 2023-08-04 | Discharge: 2023-08-04 | Disposition: A | Discharge status: Home / Self Care | Source: Ambulatory Visit | Attending: Family Medicine | Admitting: Family Medicine

## 2023-08-04 ENCOUNTER — Ambulatory Visit (HOSPITAL_BASED_OUTPATIENT_CLINIC_OR_DEPARTMENT_OTHER): Payer: Self-pay | Admitting: Family Medicine

## 2023-08-04 DIAGNOSIS — R600 Localized edema: Secondary | ICD-10-CM | POA: Insufficient documentation

## 2023-08-04 LAB — CBC WITH DIFFERENTIAL/PLATELET
Basophils Absolute: 0 x10E3/uL (ref 0.0–0.2)
Basos: 1 %
EOS (ABSOLUTE): 0 x10E3/uL (ref 0.0–0.4)
Eos: 1 %
Hematocrit: 41.1 % (ref 34.0–46.6)
Hemoglobin: 13.3 g/dL (ref 11.1–15.9)
Immature Grans (Abs): 0 x10E3/uL (ref 0.0–0.1)
Immature Granulocytes: 0 %
Lymphocytes Absolute: 1.3 x10E3/uL (ref 0.7–3.1)
Lymphs: 22 %
MCH: 33.3 pg — ABNORMAL HIGH (ref 26.6–33.0)
MCHC: 32.4 g/dL (ref 31.5–35.7)
MCV: 103 fL — ABNORMAL HIGH (ref 79–97)
Monocytes Absolute: 0.4 x10E3/uL (ref 0.1–0.9)
Monocytes: 8 %
Neutrophils Absolute: 4 x10E3/uL (ref 1.4–7.0)
Neutrophils: 68 %
Platelets: 191 x10E3/uL (ref 150–450)
RBC: 3.99 x10E6/uL (ref 3.77–5.28)
RDW: 12.3 % (ref 11.7–15.4)
WBC: 5.8 x10E3/uL (ref 3.4–10.8)

## 2023-08-04 LAB — LIPID PANEL
Chol/HDL Ratio: 2.1 ratio (ref 0.0–4.4)
Cholesterol, Total: 209 mg/dL — ABNORMAL HIGH (ref 100–199)
HDL: 100 mg/dL (ref >39)
LDL Chol Calc (NIH): 99 mg/dL (ref 0–99)
Triglycerides: 58 mg/dL (ref 0–149)
VLDL Cholesterol Cal: 10 mg/dL (ref 5–40)

## 2023-08-04 LAB — BASIC METABOLIC PANEL WITH GFR
BUN/Creatinine Ratio: 14 (ref 12–28)
BUN: 17 mg/dL (ref 8–27)
CO2: 24 mmol/L (ref 20–29)
Calcium: 10.2 mg/dL (ref 8.7–10.3)
Chloride: 102 mmol/L (ref 96–106)
Creatinine, Ser: 1.19 mg/dL — ABNORMAL HIGH (ref 0.57–1.00)
Glucose: 102 mg/dL — ABNORMAL HIGH (ref 70–99)
Potassium: 5.1 mmol/L (ref 3.5–5.2)
Sodium: 141 mmol/L (ref 134–144)
eGFR: 48 mL/min/1.73 — ABNORMAL LOW (ref >59)

## 2023-08-04 NOTE — Progress Notes (Signed)
 Hi Bobbie,  Your ultrasound does not show any occlusion or DVT present to your leg. This may be venous stasis as we discussed. If you would like further evaluation and treatment by a vascular provider, [facility] in [place] is where I would recommend and could place a referral for you. Please start wearing your compression stockings and let me know if you would like a referral.

## 2023-08-04 NOTE — Telephone Encounter (Signed)
Please see message sent by pt.

## 2023-08-08 NOTE — Progress Notes (Signed)
 Hi Bobbie,  Your kidney function is stable. Please continue to monitor your protein intake, avoid NSAIDs (ibuprofen, naproxen), and limit your animal protein intake. We will recheck with your next visit as well. Your cholesterol total is slightly elevated. Continue a heart healthy diet and exercise.

## 2023-08-12 DIAGNOSIS — I83893 Varicose veins of bilateral lower extremities with other complications: Secondary | ICD-10-CM | POA: Diagnosis not present

## 2023-08-12 DIAGNOSIS — R600 Localized edema: Secondary | ICD-10-CM | POA: Diagnosis not present

## 2023-08-12 DIAGNOSIS — I872 Venous insufficiency (chronic) (peripheral): Secondary | ICD-10-CM | POA: Diagnosis not present

## 2023-08-16 ENCOUNTER — Other Ambulatory Visit (HOSPITAL_BASED_OUTPATIENT_CLINIC_OR_DEPARTMENT_OTHER): Payer: Self-pay

## 2023-09-20 ENCOUNTER — Encounter (HOSPITAL_BASED_OUTPATIENT_CLINIC_OR_DEPARTMENT_OTHER): Payer: Self-pay | Admitting: Family Medicine

## 2023-09-21 ENCOUNTER — Other Ambulatory Visit (HOSPITAL_BASED_OUTPATIENT_CLINIC_OR_DEPARTMENT_OTHER): Payer: Self-pay | Admitting: Family Medicine

## 2023-09-21 ENCOUNTER — Other Ambulatory Visit (HOSPITAL_BASED_OUTPATIENT_CLINIC_OR_DEPARTMENT_OTHER): Payer: Self-pay

## 2023-09-21 DIAGNOSIS — I428 Other cardiomyopathies: Secondary | ICD-10-CM

## 2023-09-21 MED ORDER — COVID-19 MRNA VAC-TRIS(PFIZER) 30 MCG/0.3ML IM SUSY
0.3000 mL | PREFILLED_SYRINGE | Freq: Once | INTRAMUSCULAR | 0 refills | Status: AC
  Filled 2023-09-21: qty 0.3, 1d supply, fill #0

## 2023-09-23 ENCOUNTER — Encounter (HOSPITAL_BASED_OUTPATIENT_CLINIC_OR_DEPARTMENT_OTHER): Payer: Self-pay | Admitting: Family Medicine

## 2023-09-26 ENCOUNTER — Other Ambulatory Visit (HOSPITAL_BASED_OUTPATIENT_CLINIC_OR_DEPARTMENT_OTHER): Payer: Self-pay

## 2023-09-26 MED ORDER — COMIRNATY 30 MCG/0.3ML IM SUSY
0.3000 mL | PREFILLED_SYRINGE | Freq: Once | INTRAMUSCULAR | 0 refills | Status: AC
  Filled 2023-09-26: qty 0.3, 1d supply, fill #0

## 2023-09-26 NOTE — Telephone Encounter (Signed)
 Please see mychart message sent by pt and advise.

## 2023-09-27 ENCOUNTER — Other Ambulatory Visit (HOSPITAL_BASED_OUTPATIENT_CLINIC_OR_DEPARTMENT_OTHER): Payer: Self-pay

## 2023-09-27 ENCOUNTER — Other Ambulatory Visit (HOSPITAL_BASED_OUTPATIENT_CLINIC_OR_DEPARTMENT_OTHER): Payer: Self-pay | Admitting: Cardiology

## 2023-09-27 DIAGNOSIS — E7800 Pure hypercholesterolemia, unspecified: Secondary | ICD-10-CM

## 2023-09-27 MED ORDER — ROSUVASTATIN CALCIUM 5 MG PO TABS
5.0000 mg | ORAL_TABLET | ORAL | 11 refills | Status: AC
  Filled 2023-09-27: qty 16, 28d supply, fill #0
  Filled 2023-10-27: qty 16, 28d supply, fill #1
  Filled 2023-11-24: qty 16, 28d supply, fill #2
  Filled 2023-12-22: qty 16, 28d supply, fill #3
  Filled 2024-01-19: qty 16, 28d supply, fill #4
  Filled 2024-02-15: qty 16, 28d supply, fill #5

## 2023-09-28 ENCOUNTER — Other Ambulatory Visit (HOSPITAL_BASED_OUTPATIENT_CLINIC_OR_DEPARTMENT_OTHER): Payer: Self-pay

## 2023-10-07 DIAGNOSIS — Z1231 Encounter for screening mammogram for malignant neoplasm of breast: Secondary | ICD-10-CM | POA: Diagnosis not present

## 2023-10-07 LAB — HM MAMMOGRAPHY

## 2023-10-11 ENCOUNTER — Ambulatory Visit: Admitting: Orthopedic Surgery

## 2023-10-11 ENCOUNTER — Other Ambulatory Visit

## 2023-10-11 DIAGNOSIS — M79641 Pain in right hand: Secondary | ICD-10-CM

## 2023-10-11 DIAGNOSIS — M65311 Trigger thumb, right thumb: Secondary | ICD-10-CM

## 2023-10-11 MED ORDER — LIDOCAINE HCL 1 % IJ SOLN
1.0000 mL | INTRAMUSCULAR | Status: AC | PRN
  Administered 2023-10-11: 1 mL

## 2023-10-11 MED ORDER — BETAMETHASONE SOD PHOS & ACET 6 (3-3) MG/ML IJ SUSP
6.0000 mg | INTRAMUSCULAR | Status: AC | PRN
  Administered 2023-10-11: 6 mg via INTRA_ARTICULAR

## 2023-10-11 NOTE — Progress Notes (Signed)
 Jennifer Mcclain - 78 y.o. female MRN [id number]  Date of birth: [birth year]  Office Visit Note: Visit Date: 10/11/2023 PCP: Knute Thersia Bitters, FNP Referred by: Knute Thersia Bitters, *  Subjective: No chief complaint on file.  HPI: Jennifer Mcclain is a pleasant 78 y.o. female who presents today for right thumb trigger digit. She states this began about 2 weeks ago. It locks up on her and is very painful.  Has not undergone any significant workup or treatment for the right thumb.  Denies any significant numbness or tingling.  She is overall healthy and active at baseline.  Pertinent ROS were reviewed with the patient and found to be negative unless otherwise specified above in HPI.   Visit Reason: right thumb Duration of symptoms: 2 weeks Jennifer dominance: right Occupation: retired Diabetic: No Smoking: No Heart/Lung History: yes Blood Thinners: xaerelto   Prior Testing/EMG: none Injections (Date): none Treatments: none Prior Mcclain: none    Assessment & Plan: Visit Diagnoses:  1. Pain in right Jennifer   2. Trigger thumb, right thumb     Plan: Extensive discussion was had with the patient today regarding her right thumb trigger digit.  We discussed the etiology and pathophysiology of stenosing tenosynovitis.  We discussed conservative versus surgical treatment modalities.  From a conservative standpoint, we discussed activity modification, splinting, therapy and injections.  From a surgical standpoint, we discussed the possibility for trigger digit release as well as all risk and benefits associated.  Given that she has not trialed conservative treatments, patient is appropriate candidate for cortisone injection to the right thumb A1 pulley for symptom relief.  Risks and benefit of the cortisone injection were discussed in detail, patient agreed to proceed.  Injection was provided today without issue, patient will return in approximate 6 weeks time for a recheck.  I  also did show her Band-Aid splinting at the IP joint of the right thumb to be utilized over the upcoming weeks for symptom relief.  She expressed understanding.   Follow-up: No follow-ups on file.   Meds & Orders: No orders of the defined types were placed in this encounter.   Orders Placed This Encounter  Procedures   Jennifer/UE Inj: R thumb A1   XR Jennifer Complete Right     Procedures: Jennifer/UE Inj: R thumb A1 for trigger finger on 10/11/2023 6:48 PM Indications: pain Details: 25 G needle, volar approach Medications: 1 mL lidocaine  1 %; 6 mg betamethasone acetate-betamethasone sodium phosphate 6 (3-3) MG/ML Outcome: tolerated well, no immediate complications Consent was given by the patient. Patient was prepped and draped in the usual sterile fashion.          Clinical History: No specialty comments available.  She reports that she has never smoked. She has never used smokeless tobacco. No results for input(s): HGBA1C, LABURIC in the last 8760 hours.  Objective:   Vital Signs: There were no vitals taken for this visit.  Physical Exam  Gen: Well-appearing, in no acute distress; non-toxic CV: Regular Rate. Well-perfused. Warm.  Resp: Breathing unlabored on room air; no wheezing. Psych: Fluid speech in conversation; appropriate affect; normal thought process  Ortho Exam Right Jennifer: - Palpable nodule at the A1 pulley of the thumb, associated tenderness - Notable clicking with deep flexion of the thumb IP joint, no  evidence of significant locking with deep flexion - Sensation intact distally, Jennifer remains warm well-perfused   Imaging: XR Jennifer Complete Right Result Date: 10/11/2023 X-rays of the right Jennifer,  multiple views were obtained today.  X-rays demonstrate diffuse degenerative changes of the small joints, particular to the DIPs index through small finger.  There is notable degenerative change seen at the thumb CMC and thumb MCP region as well with joint space narrowing  and subchondral sclerosis.   Past Medical/Family/Surgical/Social History: Medications & Allergies reviewed per EMR, new medications updated. 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 09/15/2018   Pure hypercholesterolemia 06/14/2018   Coronary artery disease due to lipid rich plaque 03/02/2018   Systolic dysfunction without heart failure 02/02/2018   Nonischemic cardiomyopathy (HCC) 02/02/2018   Nonrheumatic mitral valve regurgitation 02/02/2018   Nonrheumatic tricuspid valve regurgitation 02/02/2018   Osteopenia 02/22/2017   Bilateral sensorineural hearing loss 01/28/2017   Vitamin D  deficiency 01/21/2017   Osteoarthritis of knee 01/21/2017   Non-toxic multinodular goiter 01/21/2017   IFG (impaired fasting glucose) 01/21/2017   Lactose intolerance 01/21/2017   History of hysterectomy for benign disease 01/21/2017   Multiple actinic keratoses 01/21/2017   Normocytic anemia 01/21/2017   Umbilical hernia 01/21/2017   Primary hypertension 11/30/2016   Dyslipidemia 11/30/2016   Chronic atrial fibrillation (HCC) 11/30/2016   Obstructive sleep apnea syndrome 11/30/2016   Renal cyst 10/11/2012   Past Medical History:  Diagnosis Date   Achilles tendinitis    Per [facility] New Patient packet    Allergy    Atrial fibrillation [facility])    Per records from [facility] in [place], [place]    Baker's cyst of knee, left    with resulting lower extremity edema   Benign cyst of breast, left    Per records from [facility] in [place], [place]. Has yearly U/S in the past    Blood transfusion without  reported diagnosis 1978   Cataract    Cerebrovascular disease    Per [facility] New Patient packet    Chronic atrial fibrillation (HCC)    Per [facility] New Patient packet    Chronic atrial fibrillation (HCC)    Jennifer Pinal, MD   Decreased hearing, bilateral    referred to audiologist, Per records from [facility] in [place], [place]    Disorder of bone density and structure, unspecified    Per records from [facility] in [place], [place]    Disorder of carotid artery    Jennifer Agent, MD   Dyslipidemia    Jennifer Pinal, MD   Elevated alkaline phosphatase level    Neg ative ANA 08/2013. Per records from [facility] in [place], [place]    H/O calculus of kidney during pregnancy    Jennifer Agent, MD   H/O hysterectomy for benign disease    Jennifer Agent, MD   History of fibula fracture    Right, Per records from [facility] in [place], [place]    History of torn meniscus of right knee    Per records from [facility] in [place], [place]    Humerus fracture    left leg- due to fall   Humerus fracture    Resulting from a fall, Per records from [facility] in [place],  [place]    Hypercalcemia    Per records from [facility] in [place], [place]    Hypertension    Per [facility] New Patient packet    Kidney stone on left side    Per records from [facility] in [place], [place]    Lactose intolerance    Per [facility] New Patient packet    Low back pain 03/24/2022   Multiple actinic keratoses    Jennifer Agent, MD   Myalgia due to statin    Per records from [facility] in [place], [place]. Due to crestor     Non-toxic multinodular goiter 01/21/2017   Jennifer Agent, MD   Normocytic anemia    Jennifer Agent, MD   Obstructive sleep apnea syndrome    Per records from [facility] in [place], [place]. Patient did not tolerate CPAP machine    Onychomycosis    Per records from [facility] in [place], [place]     Osteoarthritis    Per [facility] New Patient packet    Osteopenia    Per records from [facility] in [place], . Treated in the past with antiresorptive therapy   Renal cyst 10/11/2012   Simple on CT Scan 10/2012. Per records from [facility] in [place], [place]    Sensorineural hearing loss (SNHL), bilateral 01/28/2017   Jennifer Mcclain , MD   Sleep apnea Approx 2019   Umbilical hernia    Per [facility] New Patient packet    Vitamin D  deficiency    Jennifer agent, MD   Family History  Problem Relation Age of Onset   Congestive Heart Failure Mother    Diabetes Mother 60   Arthritis Mother 41   Obesity Mother    Stroke Father    Dementia Father    Diabetes Father    Hyperlipidemia Father    Alzheimer's disease Father 54   Diabetes Sister    Cancer Maternal Grandfather    Diabetes Maternal Grandfather 18   CVA Maternal Grandfather    AAA (abdominal aortic aneurysm) Sister    Congenital heart disease Daughter    Asthma Daughter    Birth defects Daughter    Early death Daughter    Heart attack Maternal Aunt    Lung cancer Maternal Grandmother 53   Breast cancer Paternal Aunt    Past Surgical History:  Procedure Laterality Date   ABDOMINAL HYSTERECTOMY  1989   One ovary, Per [facility] New Patient packet    ABLATION  1996   Radical, Per [facility] New Patient packet    BREAST CYST ASPIRATION Bilateral    numerous, over several years per pt, always benign   COLONOSCOPY  2019   Dr.Melissa Mcclain, Per [facility] New Patient packet    JOINT REPLACEMENT  Approx 2014   KNEE ARTHROSCOPY W/ MENISCAL REPAIR  2002   Per [facility] New Patient packet    MOUTH Mcclain     Dental Implant    REPLACEMENT TOTAL KNEE Left 2015   Per [facility] New Patient packet   RIGHT/LEFT HEART CATH AND CORONARY ANGIOGRAPHY N/A 02/07/2018   Procedure: RIGHT/LEFT HEART CATH AND CORONARY ANGIOGRAPHY;  Surgeon: Swaziland, Peter M, MD;  Location: [facility] INVASIVE CV LAB;  Service: Cardiovascular;  Laterality: N/A;   TONSILLECTOMY  1951    Per [facility] New Patient packet    TOOTH EXTRACTION  01/12/2019   TOOTH EXTRACTION  06/05/2019   Preparing for dental implant    Social History   Occupational History   Not  on file  Tobacco Use   Smoking status: Never   Smokeless tobacco: Never  Vaping Use   Vaping status: Never Used  Substance and Sexual Activity   Alcohol use: Yes    Comment: 2-3 a month - social   Drug use: Never   Sexual activity: Not Currently    Taeveon Keesling Estela) Mcclain, Jennifer Mcclain, Jennifer Mcclain

## 2023-10-14 ENCOUNTER — Other Ambulatory Visit (HOSPITAL_BASED_OUTPATIENT_CLINIC_OR_DEPARTMENT_OTHER): Payer: Self-pay | Admitting: Pulmonary Disease

## 2023-10-14 ENCOUNTER — Other Ambulatory Visit (HOSPITAL_BASED_OUTPATIENT_CLINIC_OR_DEPARTMENT_OTHER): Payer: Self-pay

## 2023-10-14 MED ORDER — FLUTICASONE FUROATE-VILANTEROL 200-25 MCG/ACT IN AEPB
1.0000 | INHALATION_SPRAY | Freq: Every day | RESPIRATORY_TRACT | 5 refills | Status: DC
  Filled 2023-10-14: qty 60, 30d supply, fill #0

## 2023-10-20 ENCOUNTER — Other Ambulatory Visit (HOSPITAL_BASED_OUTPATIENT_CLINIC_OR_DEPARTMENT_OTHER): Payer: Self-pay

## 2023-10-20 MED ORDER — FLUZONE HIGH-DOSE 0.5 ML IM SUSY
0.5000 mL | PREFILLED_SYRINGE | Freq: Once | INTRAMUSCULAR | 0 refills | Status: AC
Start: 2023-10-20 — End: 2023-10-21
  Filled 2023-10-20: qty 0.5, 1d supply, fill #0

## 2023-10-24 ENCOUNTER — Other Ambulatory Visit (HOSPITAL_BASED_OUTPATIENT_CLINIC_OR_DEPARTMENT_OTHER): Payer: Self-pay

## 2023-10-24 ENCOUNTER — Ambulatory Visit (HOSPITAL_BASED_OUTPATIENT_CLINIC_OR_DEPARTMENT_OTHER): Admitting: Pulmonary Disease

## 2023-10-24 ENCOUNTER — Encounter (HOSPITAL_BASED_OUTPATIENT_CLINIC_OR_DEPARTMENT_OTHER): Payer: Self-pay | Admitting: Pulmonary Disease

## 2023-10-24 VITALS — BP 133/82 | HR 105 | Ht 61.5 in | Wt 197.9 lb

## 2023-10-24 DIAGNOSIS — R0602 Shortness of breath: Secondary | ICD-10-CM

## 2023-10-24 DIAGNOSIS — J42 Unspecified chronic bronchitis: Secondary | ICD-10-CM | POA: Diagnosis not present

## 2023-10-24 MED ORDER — FLUTICASONE FUROATE-VILANTEROL 200-25 MCG/ACT IN AEPB
1.0000 | INHALATION_SPRAY | Freq: Every day | RESPIRATORY_TRACT | 11 refills | Status: AC
  Filled 2023-10-24: qty 60, 30d supply, fill #0
  Filled 2023-11-10: qty 60, 60d supply, fill #0
  Filled 2023-11-10: qty 60, 30d supply, fill #0
  Filled 2023-11-10: qty 60, 60d supply, fill #0
  Filled 2023-12-09: qty 60, 30d supply, fill #1
  Filled 2024-01-08: qty 60, 30d supply, fill #2
  Filled 2024-02-05: qty 60, 30d supply, fill #3

## 2023-10-24 NOTE — Progress Notes (Unsigned)
 Subjective:   PATIENT ID: Jennifer Mcclain GENDER: female DOB: 1945/08/23, MRN: [id number]  Chief Complaint  Patient presents with   Shortness of Breath    Followup    Reason for Visit: Follow-up emphysema  [name] is a 78 year old female never smoker with atrial fibrillation on Xarelto , HTN, HLD, allergic rhinitis who presents for follow-up for emphysema.   Initial consult She reports she had a bronchial infection in December and was told she had emphysema on CXR. Never had this diagnosis before. Reports history of allergy of dust and allergy. Denies childhood asthma symptoms or diagnosis. She was given Symbicort  two puffs BID and felt symptoms improved including cough. Before using this inhaler she has daily phlegm production for a few years and maybe associated with wheeze but rarely. Denies recurrent infections until the last few years. Has nasal congestion. Previously engaged in cheerleading, aerobic dance classes and currently in water exercise classes.   04/19/23 Since our last visit patient reports with decreased Breo dosing she has noticed that she has a more difficult time taking a breath with possibly increased chest congestion and wheeze. Denies shortness of breath or cough.   10/24/23 Since our last visit she reports her breathing improved on higher dose of Breo. Denies wheezing or cough or shortness of breath. Reports nasal congestion. On flonase once a day.   Social History: Second hand exposure during childhood Previously worked in Aeronautical engineer duties   Past Medical History:  Diagnosis Date   Achilles tendinitis    Per [facility] New Patient packet    Allergy    Atrial fibrillation [facility])    Per records from [facility] in [place], [place]    Baker's cyst of knee, left    with resulting lower extremity edema   Benign cyst of breast, left    Per records from [facility] in [place], [place]. Has yearly U/S in the  past    Blood transfusion without reported diagnosis 1978   Cataract    Cerebrovascular disease    Per [facility] New Patient packet    Chronic atrial fibrillation (HCC)    Per [facility] New Patient packet    Chronic atrial fibrillation (HCC)    Charlie Pinal, MD   Decreased hearing, bilateral    referred to audiologist, Per records from [facility] in [place], [place]    Disorder of bone density and structure, unspecified    Per records from [facility] in [place], [place]    Disorder of carotid artery    Eleanor Agent, MD   Dyslipidemia    Charlie Pinal, MD   Elevated alkaline phosphatase level    Neg ative ANA 08/2013. Per records from [facility] in [place], [place]    H/O calculus of kidney during pregnancy    Eleanor Agent, MD   H/O hysterectomy for benign disease    Eleanor Agent, MD   History of fibula fracture    Right, Per records from [facility] in [place], [place]    History of torn meniscus of right knee    Per records from [facility] in [place], [place]    Humerus fracture    left leg- due to fall   Humerus fracture    Resulting from a fall, Per records from [facility] in [place], [place]    Hypercalcemia    Per records from [facility] in [place], [place]    Hypertension  Per [facility] New Patient packet    Kidney stone on left side    Per records from [facility] in [place], [place]    Lactose intolerance    Per [facility] New Patient packet    Low back pain 03/24/2022   Multiple actinic keratoses    Eleanor Agent, MD   Myalgia due to statin    Per records from [facility] in [place], [place]. Due to crestor     Non-toxic multinodular goiter 01/21/2017   Eleanor Agent, MD   Normocytic anemia    Eleanor Agent, MD   Obstructive sleep apnea syndrome    Per records from [facility] in [place], [place]. Patient did not tolerate CPAP machine    Onychomycosis    Per records from [facility] in [place], [place]    Osteoarthritis    Per [facility] New Patient packet    Osteopenia    Per records from [facility] in [place], [place]. Treated in the past with antiresorptive therapy   Renal cyst 10/11/2012   Simple on CT Scan 10/2012. Per records from [facility] in [place], [place]    Sensorineural hearing loss (SNHL), bilateral 01/28/2017   Eleanor Agent , MD   Sleep apnea Approx 2019   Umbilical hernia    Per [facility] New Patient packet    Vitamin D  deficiency    Eleanor agent, MD     Family History  Problem Relation Age of Onset   Congestive Heart Failure Mother    Diabetes Mother 26   Arthritis Mother 68   Obesity Mother    Stroke Father    Dementia Father    Diabetes Father    Hyperlipidemia Father    Alzheimer's disease Father 65   Diabetes Sister    Cancer Maternal Grandfather    Diabetes Maternal Grandfather 41   CVA Maternal Grandfather    AAA (abdominal aortic aneurysm) Sister    Congenital heart disease Daughter    Asthma Daughter    Birth defects Daughter    Early death Daughter    Heart attack Maternal Aunt    Lung cancer Maternal Grandmother 2   Breast cancer Paternal Aunt      Social History   Occupational History   Not on file  Tobacco Use   Smoking status: Never   Smokeless tobacco: Never  Vaping Use   Vaping status: Never Used  Substance and Sexual Activity   Alcohol use: Yes    Comment: 2-3 a month - social   Drug use: Never   Sexual activity: Not Currently    Allergies  Allergen Reactions   Latex Rash and Other (See Comments)    welps   Adhesive [Tape]     Skin irritation    Grass Extracts [Gramineae Pollens]      Per records from from [facility]   Lactose Intolerance (Gi) Other (See Comments)    Unknown   Naprosyn [Naproxen] Hives   Coq10 [Coenzyme Q10] Other (See Comments)    Cramps    Zocor [Simvastatin] Other (See Comments)    cramps     Outpatient Medications Prior to Visit  Medication  Sig Dispense Refill   acetaminophen  (TYLENOL ) 650 MG CR tablet Take 650 mg by mouth at bedtime as needed for pain.     albuterol  (VENTOLIN  HFA) 108 (90 Base) MCG/ACT inhaler Inhale 2 puffs into the lungs every 6 (six) hours as needed for wheezing or shortness of breath. 6.7 g 2  Calcium  Carb-Cholecalciferol (CALCIUM  600/VITAMIN D3 PO) Take 1 tablet by mouth 2 (two) times daily.     Loratadine (CLARITIN PO) Take by mouth daily.     metoprolol  succinate (TOPROL -XL) 50 MG 24 hr tablet Take 2 tablets (100 mg total) by mouth in the morning AND 1 tablet (50 mg total) every evening. 270 tablet 3   Multiple Vitamins-Minerals (OCUVITE PO) Take 1 tablet by mouth daily.      polycarbophil (FIBERCON) 625 MG tablet Take 625 mg by mouth daily.     Probiotic Product (PHILLIPS COLON HEALTH PO) Take 1 capsule by mouth daily.      rivaroxaban  (XARELTO ) 20 MG TABS tablet Take 1 tablet (20 mg total) by mouth daily with supper. 90 tablet 1   rosuvastatin  (CRESTOR ) 5 MG tablet Take 1 tablet (5 mg total) by mouth 4 (four) times a week. 16 tablet 11   sacubitril -valsartan  (ENTRESTO ) 49-51 MG Take 1 tablet by mouth 2 (two) times daily. 180 tablet 3   triamcinolone  (KENALOG ) 0.025 % cream Apply 1 Application topically 2 (two) times daily as needed (eczema). 30 g 0   fluticasone  furoate-vilanterol (BREO ELLIPTA ) 200-25 MCG/ACT AEPB Inhale 1 puff into the lungs daily. 60 each 5   amoxicillin  (AMOXIL ) 500 MG capsule Take 2 capsules by mouth now, and then 1 capsule three times daily until gone. 21 capsule 1   No facility-administered medications prior to visit.    Review of Systems  Constitutional:  Negative for chills, diaphoresis, fever, malaise/fatigue and weight loss.  HENT:  Negative for congestion.   Respiratory:  Negative for cough, hemoptysis, sputum production, shortness of breath and wheezing.   Cardiovascular:  Negative for chest pain, palpitations and leg swelling.     Objective:   Vitals:   10/24/23  1423  BP: 133/82  Pulse: (!) 105  SpO2: 97%  Weight: 197 lb 14.4 oz (89.8 kg)  Height: 5' 1.5 (1.562 m)    SpO2: 97 %  Physical Exam: General: Well-appearing, no acute distress HENT: [place], AT Eyes: EOMI, no scleral icterus Respiratory: Clear to auscultation bilaterally.  No crackles, wheezing or rales Cardiovascular: RRR, -M/R/G, no JVD Extremities:-Edema,-tenderness Neuro: AAO x4, CNII-XII grossly intact Psych: Normal mood, normal affect  Data Reviewed:  Imaging: CXR 12/28/22 - Hyperinflation, mild cardiomegaly, unchanged compared to prior CT Chest 04/22/23 - Mild pulmonary parenchymal scarring, clustered peribronchovascular nodularity in RLL  PFT: 04/04/23 FVC 1.57 (62%) FEV1 1.27 (67%) Ratio 81  TLC 140% TV 224% RV/TLC 162% DLCO 104%. No significant bronchodilator response Interpretation: No obstructive or restrictive defect. Hyperinflation and air trapping present but unclear clinical significance in absence of  obstructive defect   Labs: CBC    Component Value Date/Time   WBC 5.8 08/03/2023 1624   WBC 4.0 10/09/2021 1620   RBC 3.99 08/03/2023 1624   RBC 4.04 10/09/2021 1620   HGB 13.3 08/03/2023 1624   HCT 41.1 08/03/2023 1624   PLT 191 08/03/2023 1624   MCV 103 (H) 08/03/2023 1624   MCH 33.3 (H) 08/03/2023 1624   MCH 32.4 10/09/2021 1620   MCHC 32.4 08/03/2023 1624   MCHC 33.2 10/09/2021 1620   RDW 12.3 08/03/2023 1624   LYMPHSABS 1.3 08/03/2023 1624   MONOABS 0.5 10/09/2021 1620   EOSABS 0.0 08/03/2023 1624   BASOSABS 0.0 08/03/2023 1624   Absolute eos 03/24/22 - 100     Assessment & Plan:   Discussion: 78 year old female never smoker with atrial fibrillation on Xarelto , HTN, HLD, allergic rhinitis  who presents for follow-up for 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 In a never smoker her hyperinflation noted on CXR is likely related to untreated asthma rather than COPD. PFTs reviewed and do not demonstrate obstructive defect in spirometry however noted to have  hyperinflation and significant air trapping. Has had good response to ICS/LABA so would continue this.  History of environmental exposures significant for second hand smoke and yarn otherwise minimal. Suspect she may have allergic type asthma/asthmatic bronchitis based on allergies (grass etc) and good response to her current ICS/LABA (Symbicort ). Discussed clinical course and management of asthma including bronchodilator regimen, preventive care and action plan for exacerbation.  Chronic bronchitis Emphysema on CXR however no COPD on PFTs --CONTINUE Breo 200-25 ONE inhalation daily. Rinse out mouth after use --OK to use albuterol  AS NEEDED for shortness of breath and wheezing  Shortness of breath Family member + lung cancer, grandmother Emphysema on CXR --Reviewed CT chest 04/22/23 - normal  Health Maintenance Immunization History  Administered Date(s) Administered   Fluad  Quad(high Dose 65+) 09/14/2018, 09/24/2019, 09/26/2020, 10/21/2021   Fluad  Trivalent(High Dose 65+) 10/21/2022   INFLUENZA, HIGH DOSE SEASONAL PF 10/31/2017, 10/20/2023   Influenza, Quadrivalent, Recombinant, Inj, Pf 10/11/2016   Influenza,inj,quad, With Preservative 10/11/2016   PFIZER(Purple Top)SARS-COV-2 Vaccination 01/30/2019, 02/19/2019, 11/06/2019, 04/23/2020   Pfizer Covid-19 Vaccine Bivalent Booster 8yrs & up 10/01/2020   Pfizer(Comirnaty )Fall Seasonal Vaccine 12 years and older 10/31/2021, 10/21/2022, 09/26/2023   Pneumococcal Conjugate-13 11/20/2013   Pneumococcal Polysaccharide-23 11/13/2012   Respiratory Syncytial Virus Vaccine ,Recomb Aduvanted(Arexvy ) 03/07/2023   Tdap 11/27/2007, 07/05/2018   Zoster Recombinant(Shingrix) 05/08/2016, 08/14/2016   CT Lung Screen - not qualified  No orders of the defined types were placed in this encounter.  Meds ordered this encounter  Medications   fluticasone  furoate-vilanterol (BREO ELLIPTA ) 200-25 MCG/ACT AEPB    Sig: Inhale 1 puff into the lungs daily.     Dispense:  60 each    Refill:  11    Return in about 1 year (around 10/23/2024).  I have spent a total time of 36-minutes on the day of the appointment including chart review, data review, collecting history, coordinating care and discussing medical diagnosis and plan with the patient/family. Past medical history, allergies, medications were reviewed. Pertinent imaging, labs and tests included in this note have been reviewed and interpreted independently by me.  Juliahna Wiswell Slater Staff, MD [facility] 10/24/2023 2:41 PM

## 2023-10-24 NOTE — Patient Instructions (Signed)
 Chronic bronchitis Emphysema on CXR however no COPD on PFTs --CONTINUE Breo 200-25 ONE inhalation daily. Rinse out mouth after use --OK to use albuterol  AS NEEDED for shortness of breath and wheezing

## 2023-10-31 ENCOUNTER — Other Ambulatory Visit (HOSPITAL_BASED_OUTPATIENT_CLINIC_OR_DEPARTMENT_OTHER): Payer: Self-pay

## 2023-11-10 ENCOUNTER — Other Ambulatory Visit (HOSPITAL_BASED_OUTPATIENT_CLINIC_OR_DEPARTMENT_OTHER): Payer: Self-pay

## 2023-11-14 ENCOUNTER — Encounter: Payer: Self-pay | Admitting: Radiology

## 2023-11-20 ENCOUNTER — Other Ambulatory Visit (HOSPITAL_BASED_OUTPATIENT_CLINIC_OR_DEPARTMENT_OTHER): Payer: Self-pay | Admitting: Cardiovascular Disease

## 2023-11-20 DIAGNOSIS — I428 Other cardiomyopathies: Secondary | ICD-10-CM

## 2023-11-20 DIAGNOSIS — I482 Chronic atrial fibrillation, unspecified: Secondary | ICD-10-CM

## 2023-11-21 ENCOUNTER — Other Ambulatory Visit (HOSPITAL_BASED_OUTPATIENT_CLINIC_OR_DEPARTMENT_OTHER): Payer: Self-pay

## 2023-11-21 MED ORDER — METOPROLOL SUCCINATE ER 50 MG PO TB24
ORAL_TABLET | ORAL | 3 refills | Status: AC
  Filled 2023-11-21: qty 270, 90d supply, fill #0
  Filled 2024-02-15: qty 270, 90d supply, fill #1

## 2023-11-22 ENCOUNTER — Other Ambulatory Visit (HOSPITAL_BASED_OUTPATIENT_CLINIC_OR_DEPARTMENT_OTHER): Payer: Self-pay

## 2023-11-22 ENCOUNTER — Ambulatory Visit: Admitting: Orthopedic Surgery

## 2023-11-24 ENCOUNTER — Other Ambulatory Visit: Payer: Self-pay | Admitting: Cardiology

## 2023-11-24 DIAGNOSIS — I482 Chronic atrial fibrillation, unspecified: Secondary | ICD-10-CM

## 2023-11-25 ENCOUNTER — Other Ambulatory Visit: Payer: Self-pay

## 2023-11-25 ENCOUNTER — Other Ambulatory Visit (HOSPITAL_BASED_OUTPATIENT_CLINIC_OR_DEPARTMENT_OTHER): Payer: Self-pay

## 2023-11-25 MED ORDER — RIVAROXABAN 20 MG PO TABS
20.0000 mg | ORAL_TABLET | Freq: Every day | ORAL | 1 refills | Status: AC
  Filled 2023-11-25: qty 90, 90d supply, fill #0

## 2023-11-25 NOTE — Telephone Encounter (Signed)
 Crcl 57 ml/min

## 2023-12-01 ENCOUNTER — Telehealth: Payer: Self-pay

## 2023-12-01 NOTE — Telephone Encounter (Signed)
 Rescheduled appointment 12/6 to 1:00

## 2023-12-09 ENCOUNTER — Other Ambulatory Visit (HOSPITAL_BASED_OUTPATIENT_CLINIC_OR_DEPARTMENT_OTHER): Payer: Self-pay | Admitting: Cardiology

## 2023-12-09 ENCOUNTER — Other Ambulatory Visit: Payer: Self-pay

## 2023-12-09 DIAGNOSIS — I428 Other cardiomyopathies: Secondary | ICD-10-CM

## 2023-12-12 ENCOUNTER — Other Ambulatory Visit (HOSPITAL_BASED_OUTPATIENT_CLINIC_OR_DEPARTMENT_OTHER): Payer: Self-pay | Admitting: Cardiology

## 2023-12-12 ENCOUNTER — Other Ambulatory Visit (HOSPITAL_BASED_OUTPATIENT_CLINIC_OR_DEPARTMENT_OTHER): Payer: Self-pay

## 2023-12-12 DIAGNOSIS — I428 Other cardiomyopathies: Secondary | ICD-10-CM

## 2023-12-12 MED ORDER — SACUBITRIL-VALSARTAN 49-51 MG PO TABS
1.0000 | ORAL_TABLET | Freq: Two times a day (BID) | ORAL | 3 refills | Status: AC
  Filled 2023-12-12: qty 180, 90d supply, fill #0

## 2023-12-13 ENCOUNTER — Other Ambulatory Visit (HOSPITAL_COMMUNITY): Payer: Self-pay

## 2023-12-19 ENCOUNTER — Ambulatory Visit: Admitting: Orthopedic Surgery

## 2023-12-19 DIAGNOSIS — M65311 Trigger thumb, right thumb: Secondary | ICD-10-CM

## 2023-12-19 NOTE — Progress Notes (Signed)
 Jennifer Mcclain - 78 y.o. female MRN [id number]  Date of birth: September 12, 1945  Office Visit Note: Visit Date: 12/19/2023 PCP: Knute Thersia Bitters, FNP Referred by: Knute Thersia Bitters, *  Subjective: No chief complaint on file.  HPI: Jennifer Mcclain is a pleasant 78 y.o. female who presents today for follow-up of right thumb trigger digit.  She underwent injection to the right thumb A1 pulley region approximately 6 weeks prior with notable improvement in her symptoms.  Pain is improved significantly, minimal clicking and locking currently.  Does have an occasional click with deep flexion of the IP joint.  Has also been doing Band-Aid splinting as instructed.  Pertinent ROS were reviewed with the patient and found to be negative unless otherwise specified above in HPI.      Assessment & Plan: Visit Diagnoses:  1. Trigger thumb, right thumb      Plan: Extensive discussion was had with the patient today regarding her right thumb trigger digit.  We discussed the etiology and pathophysiology of stenosing tenosynovitis.  We discussed conservative versus surgical treatment modalities.  From a conservative standpoint, we discussed activity modification, splinting, therapy and injections.  From a surgical standpoint, we discussed the possibility for trigger digit release as well as all risk and benefits associated.  Given that she has responded well to the previous injection and Band-Aid splinting.  We can continue with conservative measures for the time being.  She is welcome to return to me as needed in the future should symptoms progress or recur.  She expressed full understanding.   Follow-up: No follow-ups on file.   Meds & Orders: No orders of the defined types were placed in this encounter.   No orders of the defined types were placed in this encounter.    Procedures: No procedures performed      Clinical History: No specialty comments available.  She reports that she  has never smoked. She has never used smokeless tobacco. No results for input(s): HGBA1C, LABURIC in the last 8760 hours.  Objective:   Vital Signs: There were no vitals taken for this visit.  Physical Exam  Gen: Well-appearing, in no acute distress; non-toxic CV: Regular Rate. Well-perfused. Warm.  Resp: Breathing unlabored on room air; no wheezing. Psych: Fluid speech in conversation; appropriate affect; normal thought process  Ortho Exam Right hand: - Minimal nodule at the A1 pulley of the thumb, no significant tenderness - No evidence of clicking with deep flexion of the thumb IP joint, no  evidence of significant locking with deep flexion - Sensation intact distally, hand remains warm well-perfused   Imaging: No results found.   Past Medical/Family/Surgical/Social History: Medications & Allergies reviewed per EMR, new medications updated. 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 09/15/2018   Pure hypercholesterolemia 06/14/2018   Coronary artery disease due to lipid rich plaque 03/02/2018   Systolic dysfunction without heart failure 02/02/2018   Nonischemic cardiomyopathy (HCC) 02/02/2018   Nonrheumatic mitral valve regurgitation 02/02/2018   Nonrheumatic tricuspid valve regurgitation 02/02/2018   Osteopenia 02/22/2017   Bilateral sensorineural hearing loss 01/28/2017   Vitamin  D deficiency 01/21/2017   Osteoarthritis of knee 01/21/2017   Non-toxic multinodular goiter 01/21/2017   IFG (impaired fasting glucose) 01/21/2017   Lactose intolerance 01/21/2017   History of hysterectomy for  benign disease 01/21/2017   Multiple actinic keratoses 01/21/2017   Normocytic anemia 01/21/2017   Umbilical hernia 01/21/2017   Primary hypertension 11/30/2016   Dyslipidemia 11/30/2016   Chronic atrial fibrillation (HCC) 11/30/2016   Obstructive sleep apnea syndrome 11/30/2016   Renal cyst 10/11/2012   Past Medical History:  Diagnosis Date   Achilles tendinitis    Per [facility] New Patient packet    Allergy    Atrial fibrillation [facility])    Per records from [facility] in [place], [place]    Baker's cyst of knee, left    with resulting lower extremity edema   Benign cyst of breast, left    Per records from [facility] in [place], [place]. Has yearly U/S in the past    Blood transfusion without reported diagnosis 1978   Cataract    Cerebrovascular disease    Per [facility] New Patient packet    Chronic atrial fibrillation (HCC)    Per [facility] New Patient packet    Chronic atrial fibrillation (HCC)    Charlie Pinal, MD   Decreased hearing, bilateral    referred to audiologist, Per records from [facility] in [place], [place]    Disorder of bone density and structure, unspecified    Per records from [facility] in [place], [place]    Disorder of carotid artery    Eleanor Agent, MD   Dyslipidemia    Charlie Pinal, MD   Elevated alkaline phosphatase level    Neg ative ANA 08/2013. Per records from [facility] in [place], [place]    H/O calculus of kidney during pregnancy    Eleanor Agent, MD   H/O hysterectomy for benign disease    Eleanor Agent, MD   History of fibula fracture    Right, Per records from [facility] in [place], [place]    History of torn meniscus of right knee    Per records from [facility] in [place], [place]    Humerus fracture    left leg- due to fall   Humerus fracture    Resulting from a fall, Per records from [facility] in [place], [place]    Hypercalcemia    Per records from [facility] in [place], [place]    Hypertension    Per [facility] New Patient packet    Kidney stone on left side    Per records from [facility] in [place], [place]    Lactose intolerance    Per [facility] New Patient packet    Low back pain 03/24/2022   Multiple actinic keratoses    Eleanor Agent, MD   Myalgia due to statin    Per records from [facility] in [place], [place]. Due to crestor     Non-toxic multinodular goiter 01/21/2017   Eleanor Agent, MD   Normocytic anemia    Eleanor Agent, MD   Obstructive sleep apnea syndrome    Per records from [facility] in [place], [place]. Patient did not tolerate CPAP machine    Onychomycosis    Per records from [facility] in [place], [place]    Osteoarthritis    Per [facility] New Patient packet    Osteopenia    Per records from [facility] in [place], [place]. Treated in the past  with antiresorptive therapy   Renal cyst 10/11/2012   Simple on CT Scan 10/2012. Per records from [facility] in [place], [place]    Sensorineural hearing loss (SNHL), bilateral 01/28/2017   Eleanor Agent , MD   Sleep apnea Approx 2019   Umbilical hernia    Per [facility] New Patient packet    Vitamin D  deficiency    Eleanor agent, MD   Family History  Problem Relation Age of Onset   Congestive Heart Failure Mother    Diabetes Mother 67   Arthritis Mother 74   Obesity Mother    Stroke Father    Dementia Father    Diabetes Father    Hyperlipidemia Father    Alzheimer's disease Father 80   Diabetes Sister    Cancer Maternal Grandfather    Diabetes Maternal Grandfather 93   CVA Maternal Grandfather    AAA (abdominal aortic aneurysm) Sister    Congenital heart disease Daughter    Asthma Daughter    Birth defects Daughter    Early death Daughter    Heart attack Maternal Aunt    Lung cancer Maternal Grandmother 45   Breast cancer Paternal Aunt    Past Surgical History:  Procedure Laterality Date   ABDOMINAL HYSTERECTOMY  1989    One ovary, Per [facility] New Patient packet    ABLATION  1996   Radical, Per [facility] New Patient packet    BREAST CYST ASPIRATION Bilateral    numerous, over several years per pt, always benign   COLONOSCOPY  2019   Dr.Melissa Agent, Per [facility] New Patient packet    JOINT REPLACEMENT  Approx 2014   KNEE ARTHROSCOPY W/ MENISCAL REPAIR  2002   Per [facility] New Patient packet    MOUTH SURGERY     Dental Implant    REPLACEMENT TOTAL KNEE Left 2015   Per [facility] New Patient packet   RIGHT/LEFT HEART CATH AND CORONARY ANGIOGRAPHY N/A 02/07/2018   Procedure: RIGHT/LEFT HEART CATH AND CORONARY ANGIOGRAPHY;  Surgeon: Jordan, Peter M, MD;  Location: [facility] INVASIVE CV LAB;  Service: Cardiovascular;  Laterality: N/A;   TONSILLECTOMY  1951   Per [facility] New Patient packet    TOOTH EXTRACTION  01/12/2019   TOOTH EXTRACTION  06/05/2019   Preparing for dental implant    Social History   Occupational History   Not on file  Tobacco Use   Smoking status: Never   Smokeless tobacco: Never  Vaping Use   Vaping status: Never Used  Substance and Sexual Activity   Alcohol use: Yes    Comment: 2-3 a month - social   Drug use: Never   Sexual activity: Not Currently    Kasie Leccese Estela) Arlinda, M.D. [facility], Hand Surgery

## 2024-01-11 ENCOUNTER — Other Ambulatory Visit (HOSPITAL_BASED_OUTPATIENT_CLINIC_OR_DEPARTMENT_OTHER): Payer: Self-pay

## 2024-01-23 ENCOUNTER — Ambulatory Visit: Admitting: Dermatology

## 2024-01-23 ENCOUNTER — Other Ambulatory Visit (HOSPITAL_BASED_OUTPATIENT_CLINIC_OR_DEPARTMENT_OTHER): Payer: Self-pay

## 2024-01-23 ENCOUNTER — Encounter: Payer: Self-pay | Admitting: Dermatology

## 2024-01-23 DIAGNOSIS — L649 Androgenic alopecia, unspecified: Secondary | ICD-10-CM | POA: Diagnosis not present

## 2024-01-23 DIAGNOSIS — Z79899 Other long term (current) drug therapy: Secondary | ICD-10-CM

## 2024-01-23 MED ORDER — SAFETY SEAL MISCELLANEOUS MISC: 1.0000 | Freq: Every day | 6 refills | Status: AC

## 2024-01-23 NOTE — Progress Notes (Unsigned)
" ° °  Follow-Up Visit  Patient (and/or pt guardian) consented to the use of AI-assisted tools for note generation.    Subjective  Jennifer Mcclain is a 79 y.o. female who presents for the following: Hair loss  Patient was last evaluated on 07/20/23 for ISKs that were treated with cryotherapy but is here to day to discuss hair loss Patient reports she has had issues with thinning for three years Patient uses Loreal Everpure shampoo for thickening Patient reports she does not use any hair or scalp oils Patient has not tried any OTC products for hair growth and has not previously been treated for issue Patient reports hair loss with both grandmothers  The following portions of the chart were reviewed this encounter and updated as appropriate: medications, allergies, medical history  Review of Systems:  No other skin or systemic complaints except as noted in HPI or Assessment and Plan.  Objective  Well appearing patient in no apparent distress; mood and affect are within normal limits.  A focused examination was performed of the following areas: Scalp  Relevant exam findings are noted in the Assessment and Plan.               Assessment & Plan  ANDROGENETIC ALOPECIA (FEMALE PATTERN HAIR LOSS) Exam: Diffuse thinning of the crown and widening of the midline part with retention of the frontal hairline  Female Androgenic Alopecia is a chronic condition related to genetics and/or hormonal changes.  In women androgenetic alopecia is commonly associated with menopause but may occur any time after puberty.  It causes hair thinning primarily on the crown with widening of the part and temporal hairline recession.    Treatment Plan: Prescribed Hormonic Hair Solution (Minoxidil 8% & Finasteride 0.05%) from [employer] to use every morning Recommended Vital Proteins Collagen Supplement Recommended Viviscal Hair growth Supplement   Long term medication management.  Patient is using long term  (months to years) prescription medication  to control their dermatologic condition.  These medications require periodic monitoring to evaluate for efficacy and side effects and may require periodic laboratory monitoring.      ANDROGENIC ALOPECIA   This Visit - Safety Seal Miscellaneous MISC  Return in about 4 months (around 05/22/2024) for Alopecia f/u.  I, Lyle Cords, as acting as a neurosurgeon for [employer], DO .   Documentation: I have reviewed the above documentation for accuracy and completeness, and I agree with the above.  Delon Lenis, DO   "

## 2024-01-23 NOTE — Patient Instructions (Addendum)
 " VISIT SUMMARY:  Today, you were seen for hair thinning that has been progressively worsening over the past couple of years. We discussed your symptoms, and I provided a diagnosis and treatment plan to help manage your condition.  YOUR PLAN:  -ANDROGENETIC ALOPECIA:  Androgenetic alopecia is a common form of hair loss that occurs due to hormonal changes, often around menopause. It causes chronic hair thinning, particularly at the midline and temples.   To manage this, I have prescribed a compounded topical solution containing 8% minoxidil and finasteride, which you should apply to your scalp in the morning. Use 1-3 drops, making parts in your hair and rubbing it in to ensure it stays on the scalp.   Continuous use is necessary to maintain results, and you may start to see hair regrowth in 3-6 months, with full results in 1-1.5 years. Hair may grow back gray, but you can dye it if you wish.   Additionally, I recommend taking a collagen supplement (Vital Protein) to support hair, skin, and nail health. You may also consider adding Viviscal supplement to your regimen.  INSTRUCTIONS:  Please follow up with me in May to assess your progress with the treatment.          Important Information  Due to recent changes in healthcare laws, you may see results of your pathology and/or laboratory studies on MyChart before the doctors have had a chance to review them. We understand that in some cases there may be results that are confusing or concerning to you. Please understand that not all results are received at the same time and often the doctors may need to interpret multiple results in order to provide you with the best plan of care or course of treatment. Therefore, we ask that you please give us  2 business days to thoroughly review all your results before contacting the office for clarification. Should we see a critical lab result, you will be contacted sooner.   If You Need Anything After  Your Visit  If you have any questions or concerns for your doctor, please call our main line at [phone] If no one answers, please leave a voicemail as directed and we will return your call as soon as possible. Messages left after 4 pm will be answered the following business day.   You may also send us  a message via MyChart. We typically respond to MyChart messages within 1-2 business days.  For prescription refills, please ask your pharmacy to contact our office. Our fax number is [phone].  If you have an urgent issue when the clinic is closed that cannot wait until the next business day, you can page your doctor at the number below.    Please note that while we do our best to be available for urgent issues outside of office hours, we are not available 24/7.   If you have an urgent issue and are unable to reach us , you may choose to seek medical care at your doctor's office, retail clinic, urgent care center, or emergency room.  If you have a medical emergency, please immediately call 911 or go to the emergency department. In the event of inclement weather, please call our main line at [phone] for an update on the status of any delays or closures.  Dermatology Medication Tips: Please keep the boxes that topical medications come in in order to help keep track of the instructions about where and how to use these. Pharmacies typically print the medication instructions only on the  boxes and not directly on the medication tubes.   If your medication is too expensive, please contact our office at [phone] or send us  a message through MyChart.   We are unable to tell what your co-pay for medications will be in advance as this is different depending on your insurance coverage. However, we may be able to find a substitute medication at lower cost or fill out paperwork to get insurance to cover a needed medication.   If a prior authorization is required to get your medication covered  by your insurance company, please allow us  1-2 business days to complete this process.  Drug prices often vary depending on where the prescription is filled and some pharmacies may offer cheaper prices.  The website www.goodrx.com contains coupons for medications through different pharmacies. The prices here do not account for what the cost may be with help from insurance (it may be cheaper with your insurance), but the website can give you the price if you did not use any insurance.  - You can print the associated coupon and take it with your prescription to the pharmacy.  - You may also stop by our office during regular business hours and pick up a GoodRx coupon card.  - If you need your prescription sent electronically to a different pharmacy, notify our office through [facility] or by phone at [phone]     "

## 2024-02-03 ENCOUNTER — Ambulatory Visit (INDEPENDENT_AMBULATORY_CARE_PROVIDER_SITE_OTHER): Admitting: Family Medicine

## 2024-02-03 ENCOUNTER — Encounter (HOSPITAL_BASED_OUTPATIENT_CLINIC_OR_DEPARTMENT_OTHER): Payer: Self-pay | Admitting: Family Medicine

## 2024-02-03 VITALS — BP 134/59 | HR 88 | Ht 61.5 in | Wt 198.9 lb

## 2024-02-03 DIAGNOSIS — Z789 Other specified health status: Secondary | ICD-10-CM

## 2024-02-03 DIAGNOSIS — M7989 Other specified soft tissue disorders: Secondary | ICD-10-CM | POA: Insufficient documentation

## 2024-02-03 DIAGNOSIS — R600 Localized edema: Secondary | ICD-10-CM | POA: Diagnosis not present

## 2024-02-03 DIAGNOSIS — I4821 Permanent atrial fibrillation: Secondary | ICD-10-CM

## 2024-02-03 DIAGNOSIS — E785 Hyperlipidemia, unspecified: Secondary | ICD-10-CM | POA: Diagnosis not present

## 2024-02-03 DIAGNOSIS — N182 Chronic kidney disease, stage 2 (mild): Secondary | ICD-10-CM

## 2024-02-03 DIAGNOSIS — I5042 Chronic combined systolic (congestive) and diastolic (congestive) heart failure: Secondary | ICD-10-CM

## 2024-02-03 DIAGNOSIS — I428 Other cardiomyopathies: Secondary | ICD-10-CM

## 2024-02-03 DIAGNOSIS — Z0000 Encounter for general adult medical examination without abnormal findings: Secondary | ICD-10-CM

## 2024-02-03 NOTE — Progress Notes (Unsigned)
 "  Subjective:   Jennifer Mcclain [facility] 05-Jan-1946  02/03/2024   CC: Chief Complaint  Patient presents with   Annual Exam    Pt is here today for her physical. States she is still having swelling in her ankles even with wearing compression socks.    HPI: Jennifer Mcclain is a 79 y.o. female who presents for a routine health maintenance exam.  Labs collected at time of visit.   SWELLING ANKLES:  Patient reports ongoing swelling to bilateral lower extremities ongoing for several months. [facility] and was told no issues with venous stasis or insufficiency. She still does aquatic therapy and wears mid calf compression socks. Denies significant change in weight or ascites in abdomen. Denies pain with walking. Reports swelling used to improve with elevation, but still is present when waking up in the morning.   Wt Readings from Last 3 Encounters:  02/03/24 198 lb 14.4 oz (90.2 kg)  10/24/23 197 lb 14.4 oz (89.8 kg)  08/03/23 194 lb 12.8 oz (88.4 kg)    VITAMIN SUPPLEMENTATION:  Patient currently starting Viviscal for hair supplementation and currently taking Ocuvite. She is wondering if these have any interaction and if there is too much Zinc with taking both together.    HEALTH SCREENINGS: - Vision Screening: up to date - Dental Visits: up to date - Pap smear: not applicable - Breast Exam: Declined - STD Screening: Declined - Mammogram (40+): Up to date  - Colonoscopy (45+): Not applicable  - Bone Density (65+ or under 65 with predisposing conditions): Ordered previously, pt to schedule if desired.   - Lung CA screening with low-dose CT:  Not applicable Adults age 60-80 who are current cigarette smokers or quit within the last 15 years. Must have 20 pack year history.   Depression and Anxiety Screen done today and results listed below:     02/03/2024   11:06 AM 08/03/2023    3:23 PM 03/22/2023    3:18 PM 01/31/2023    2:12 PM  10/15/2022    1:09 PM  Depression screen PHQ 2/9  Decreased Interest 0 0 1 0 0  Down, Depressed, Hopeless 0 0 0 0 0  PHQ - 2 Score 0 0 1 0 0  Altered sleeping 0 0 0 0 0  Tired, decreased energy 0 0 0 0 0  Change in appetite 0 0 0 0 0  Feeling bad or failure about yourself  0 0 0 0 0  Trouble concentrating 0 0 0 0 0  Moving slowly or fidgety/restless 0 0 0 0 0  Suicidal thoughts 0 0 0 0 0  PHQ-9 Score 0 0  1  0  0   Difficult doing work/chores Not difficult at all Not difficult at all Not difficult at all Not difficult at all Not difficult at all     Data saved with a previous flowsheet row definition      02/03/2024   11:06 AM 08/03/2023    3:23 PM 01/31/2023    2:12 PM  GAD 7 : Generalized Anxiety Score  Nervous, Anxious, on Edge 0 0  0   Control/stop worrying 0 0  0   Worry too much - different things 0 0  0   Trouble relaxing 0 0  0   Restless 0 0  0   Easily annoyed or irritable 0 0  0   Afraid - awful might happen 0 0  0  Total GAD 7 Score 0 0 0  Anxiety Difficulty Not difficult at all Not difficult at all Not difficult at all     Data saved with a previous flowsheet row definition    IMMUNIZATIONS: - Tdap: Tetanus vaccination status reviewed: last tetanus booster within 10 years. - HPV: Not applicable - Influenza: Up to date - Pneumo Vaccines: Up to date - Shingrix (50+): Up to date   Past medical history, surgical history, medications, allergies, family history and social history reviewed with patient today and changes made to appropriate areas of the chart.   Past Medical History:  Diagnosis Date   Achilles tendinitis    Per [facility] New Patient packet    Allergy    Asthma    Atrial fibrillation [facility])    Per records from [facility] in [place], [place]    Baker's cyst of knee, left    with resulting lower extremity edema   Benign cyst of breast, left    Per records from [facility] in [place], [place]. Has yearly U/S in the past    Blood  transfusion without reported diagnosis 1978   Cataract    Cerebrovascular disease    Per [facility] New Patient packet    Chronic atrial fibrillation (HCC)    Per [facility] New Patient packet    Chronic atrial fibrillation (HCC)    Charlie Pinal, MD   Decreased hearing, bilateral    referred to audiologist, Per records from [facility] in [place], [place]    Disorder of bone density and structure, unspecified    Per records from [facility] in [place], [place]    Disorder of carotid artery    Eleanor Agent, MD   Dyslipidemia    Charlie Pinal, MD   Elevated alkaline phosphatase level    Neg ative ANA 08/2013. Per records from [facility] in [place], [place]    Emphysema of lung [facility])    H/O calculus of kidney during pregnancy    Eleanor Agent, MD   H/O hysterectomy for benign disease    Eleanor Agent, MD   History of fibula fracture    Right, Per records from [facility] in [place], [place]    History of torn meniscus of right knee    Per records from [facility] in [place], [place]    Humerus fracture    left leg- due to fall   Humerus fracture    Resulting from a fall, Per records from [facility] in [place], [place]    Hypercalcemia    Per records from [facility] in [place], [place]    Hypertension    Per [facility] New Patient packet    Kidney stone on left side    Per records from [facility] in [place], [place]    Lactose intolerance    Per [facility] New Patient packet    Low back pain 03/24/2022   Multiple actinic keratoses    Eleanor Agent, MD   Myalgia due to statin    Per records from [facility] in [place], [place]. Due to crestor     Non-toxic multinodular goiter 01/21/2017   Eleanor Agent, MD   Normocytic anemia    Eleanor Agent, MD   Obstructive sleep apnea syndrome    Per records from [facility] in [place], [place]. Patient did not tolerate CPAP machine    Onychomycosis    Per records from [facility] in [place], [place]  Osteoarthritis    Per [facility] New Patient packet    Osteopenia    Per records from [facility] in [place], [place]. Treated in the past with antiresorptive therapy   Renal cyst 10/11/2012   Simple on CT Scan 10/2012. 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   Eleanor Agent , MD   Sleep apnea Approx 2019   Umbilical hernia    Per [facility] New Patient packet    Vitamin D  deficiency    Eleanor agent, MD    Past Surgical History:  Procedure Laterality Date   ABDOMINAL HYSTERECTOMY  1989   One ovary, Per [facility] New Patient packet    ABLATION  1996   Radical, Per [facility] New Patient packet    BREAST CYST ASPIRATION Bilateral    numerous, over several years per pt, always benign   COLONOSCOPY  2019   Dr.Melissa Agent, Per [facility] New Patient packet    EYE SURGERY     JOINT REPLACEMENT  Approx 2014   KNEE ARTHROSCOPY W/ MENISCAL REPAIR  2002   Per [facility] New Patient packet    MOUTH SURGERY     Dental Implant    REPLACEMENT TOTAL KNEE Left 2015   Per [facility] New Patient packet   RIGHT/LEFT HEART CATH AND CORONARY ANGIOGRAPHY N/A 02/07/2018   Procedure: RIGHT/LEFT HEART CATH AND CORONARY ANGIOGRAPHY;  Surgeon: Jordan, Peter M, MD;  Location: [facility] INVASIVE CV LAB;  Service: Cardiovascular;  Laterality: N/A;   TONSILLECTOMY  1951   Per [facility] New Patient packet    TOOTH EXTRACTION  01/12/2019   TOOTH EXTRACTION  06/05/2019   Preparing for dental implant     Current Outpatient Medications on File Prior to Visit  Medication Sig   acetaminophen  (TYLENOL ) 650 MG CR tablet Take 650 mg by mouth at bedtime as needed for pain.   albuterol  (VENTOLIN  HFA) 108 (90 Base) MCG/ACT inhaler Inhale 2 puffs into the lungs every 6 (six) hours as needed for wheezing or shortness of breath.   Calcium  Carb-Cholecalciferol (CALCIUM  600/VITAMIN D3 PO) Take 1 tablet by mouth 2 (two) times daily.   fluticasone   furoate-vilanterol (BREO ELLIPTA ) 200-25 MCG/ACT AEPB Inhale 1 puff into the lungs daily.   Loratadine (CLARITIN PO) Take by mouth daily.   metoprolol  succinate (TOPROL -XL) 50 MG 24 hr tablet Take 2 tablets (100 mg total) by mouth in the morning AND 1 tablet (50 mg total) every evening.   Multiple Vitamins-Minerals (OCUVITE PO) Take 1 tablet by mouth daily.    polycarbophil (FIBERCON) 625 MG tablet Take 625 mg by mouth daily.   Probiotic Product (PHILLIPS COLON HEALTH PO) Take 1 capsule by mouth daily.    rivaroxaban  (XARELTO ) 20 MG TABS tablet Take 1 tablet (20 mg total) by mouth daily with supper.   rosuvastatin  (CRESTOR ) 5 MG tablet Take 1 tablet (5 mg total) by mouth 4 (four) times a week.   sacubitril -valsartan  (ENTRESTO ) 49-51 MG Take 1 tablet by mouth 2 (two) times daily.   Safety Seal Miscellaneous MISC Apply 1 Application topically daily. Medication Name: Hormonic Hair Solution (Minoxidil 8% and Finasteride 0.05%)   triamcinolone  (KENALOG ) 0.025 % cream Apply 1 Application topically 2 (two) times daily as needed (eczema).   No current facility-administered medications on file prior to visit.    Allergies[1]   Social History   Socioeconomic History   Marital status: Married    Spouse name: Not on file   Number  of children: Not on file   Years of education: Not on file   Highest education level: Bachelor's degree (e.g., BA, AB, BS)  Occupational History   Not on file  Tobacco Use   Smoking status: Never   Smokeless tobacco: Never  Vaping Use   Vaping status: Never Used  Substance and Sexual Activity   Alcohol use: Yes    Comment: Occasional beer or wine spritzer   Drug use: Never   Sexual activity: Not Currently  Other Topics Concern   Not on file  Social History Narrative   Per [facility] New Patient Packet 12/06/17:      Diet: Low fat, dairy free      Caffeine: Yes      Married, if yes what year: Married, 1971      Do you live in a house, apartment, assisted living,  condo, trailer, ect: Condo, 2 stories, 2 persons       Pets: No      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 No   Do you have difficulty preparing food or eating? No   Do you have difficulty managing your medications? No   Do you have difficulty managing your finances? No   Do you have difficulty affording your medications? No   Social Drivers of Health   Tobacco Use: Low Risk (02/03/2024)   Patient History    Smoking Tobacco Use: Never    Smokeless Tobacco Use: Never    Passive Exposure: Not on file  Financial Resource Strain: Low Risk (01/27/2024)   Overall Financial Resource Strain (CARDIA)    Difficulty of Paying Living Expenses: Not hard at all  Food Insecurity: No Food Insecurity (01/27/2024)   Epic    Worried About Programme Researcher, Broadcasting/film/video in the Last Year: Never true    Ran Out of Food in the Last Year: Never true  Transportation Needs: No Transportation Needs (01/27/2024)   Epic    Lack of Transportation (Medical): No    Lack of Transportation (Non-Medical): No  Physical Activity: Insufficiently Active (01/27/2024)   Exercise Vital Sign    Days of Exercise per Week: 3 days    Minutes of Exercise per Session: 30 min  Stress: No Stress Concern Present (01/27/2024)   [employer] of Occupational Health - Occupational Stress Questionnaire    Feeling of Stress: Not at all  Social Connections: Moderately Isolated (01/27/2024)   Social Connection and Isolation Panel    Frequency of Communication with Friends and Family: Once a week    Frequency of Social Gatherings with Friends and Family: Once a week    Attends Religious Services: Patient declined    Database Administrator or Organizations: Yes    Attends Banker  Meetings: More than 4 times per year    Marital Status: Married  Catering Manager Violence: Not At Risk (03/22/2023)   Humiliation, Afraid, Rape, and Kick questionnaire    Fear of Current or Ex-Partner: No    Emotionally Abused: No    Physically Abused: No    Sexually Abused: No  Depression (PHQ2-9): Low Risk (02/03/2024)   Depression (PHQ2-9)    PHQ-2 Score: 0  Alcohol  Screen: Low Risk (01/27/2024)   Alcohol Screen    Last Alcohol Screening Score (AUDIT): 2  Housing: Low Risk (01/27/2024)   Epic    Unable to Pay for Housing in the Last Year: No    Number of Times Moved in the Last Year: 0    Homeless in the Last Year: No  Utilities: Not At Risk (03/22/2023)   AHC Utilities    Threatened with loss of utilities: No  Health Literacy: Adequate Health Literacy (03/22/2023)   B1300 Health Literacy    Frequency of need for help with medical instructions: Never   Tobacco Use History[2] Social History   Substance and Sexual Activity  Alcohol Use Yes   Comment: Occasional beer or wine spritzer    Family History  Problem Relation Age of Onset   Congestive Heart Failure Mother    Diabetes Mother 80   Arthritis Mother 67   Obesity Mother    Stroke Father    Dementia Father    Diabetes Father    Hyperlipidemia Father    Alzheimer's disease Father 106   Diabetes Sister    Cancer Maternal Grandfather    Diabetes Maternal Grandfather 57   CVA Maternal Grandfather    AAA (abdominal aortic aneurysm) Sister    Congenital heart disease Daughter    Asthma Daughter    Birth defects Daughter    Early death Daughter    Heart attack Maternal Aunt    Lung cancer Maternal Grandmother 81   Breast cancer Paternal Aunt      ROS: Denies fever, fatigue, unexplained weight loss/gain, chest pain, SHOB, and palpitations. Denies neurological deficits, gastrointestinal or genitourinary complaints, and skin changes.   Objective:   Today's Vitals   02/03/24 1102  BP: (!) 134/59  Pulse: 88   SpO2: 95%  Weight: 198 lb 14.4 oz (90.2 kg)  Height: 5' 1.5 (1.562 m)    GENERAL APPEARANCE: Well-appearing, in NAD. Well nourished.  SKIN: Pink, warm and dry. Turgor normal. No rash, lesion, ulceration, or ecchymoses. Hair evenly distributed.  HEENT: HEAD: Normocephalic.  EYES: PERRLA. EOMI. Lids intact w/o defect. Sclera white, Conjunctiva pink w/o exudate.  EARS: External ear w/o redness, swelling, masses or lesions. EAC clear. TM's intact, translucent w/o bulging, appropriate landmarks visualized. Appropriate acuity to conversational tones.  NOSE: Septum midline w/o deformity. Nares patent, mucosa pink and non-inflamed w/o drainage. No sinus tenderness.  THROAT: Uvula midline. Oropharynx clear. Tonsils non-inflamed w/o exudate. Oral mucosa pink and moist.  NECK: Supple, Trachea midline. Full ROM w/o pain or tenderness. No lymphadenopathy. Thyroid  non-tender w/o enlargement or palpable masses.  BREASTS: Breasts pendulous, symmetrical, and w/o palpable masses. Nipples everted and w/o discharge. No rash or skin retraction. No axillary or supraclavicular lymphadenopathy.  RESPIRATORY: Chest wall symmetrical w/o masses. Respirations even and non-labored. Breath sounds clear to auscultation bilaterally. No wheezes, rales, rhonchi, or crackles. CARDIAC: S1, S2 present, regular rate and rhythm. No gallops, murmurs, rubs, or clicks. PMI w/o lifts, heaves, or thrills. No carotid bruits. Capillary refill <2 seconds. Peripheral pulses 2+ bilaterally. GI: Abdomen soft w/o distention. Normoactive bowel sounds. No palpable masses or tenderness. No guarding or rebound tenderness. Liver and spleen w/o tenderness or enlargement. No CVA tenderness.  GU: External genitalia without erythema, lesions, or masses. No lymphadenopathy. Vaginal mucosa pink and moist without exudate, lesions, or ulcerations. Cervix pink without discharge. Cervical os closed. Uterus and adnexae palpable, not enlarged, and w/o  tenderness. No palpable masses.  MSK: Muscle tone and strength appropriate  for age, w/o atrophy or abnormal movement.  EXTREMITIES: Active ROM intact, w/o tenderness, crepitus, or contracture. No obvious joint deformities or effusions. No clubbing, edema, or cyanosis.  NEUROLOGIC: CN's II-XII intact. Motor strength symmetrical with no obvious weakness. No sensory deficits. DTR's 2+ symmetric bilaterally. Steady, even gait.  PSYCH/MENTAL STATUS: Alert, oriented x 3. Cooperative, appropriate mood and affect.   Chaperoned by Damien Many, CMA   Results for orders placed or performed in visit on 09/23/23  HM MAMMOGRAPHY   Collection Time: 10/07/23 12:00 AM  Result Value Ref Range   HM Mammogram 0-4 Bi-Rad 0-4 Bi-Rad, Self Reported Normal    Assessment & Plan:  ***  No orders of the defined types were placed in this encounter.   PATIENT COUNSELING:  - Encouraged a healthy well-balanced diet. Patient may adjust caloric intake to maintain or achieve ideal body weight. May reduce intake of dietary saturated fat and total fat and have adequate dietary potassium and calcium  preferably from fresh fruits, vegetables, and low-fat dairy products.   - Advised to avoid cigarette smoking. - Discussed with the patient that most people either abstain from alcohol or drink within safe limits (<=14/week and <=4 drinks/occasion for males, <=7/weeks and <= 3 drinks/occasion for females) and that the risk for alcohol disorders and other health effects rises proportionally with the number of drinks per week and how often a drinker exceeds daily limits. - Discussed cessation/primary prevention of drug use and availability of treatment for abuse.  - Discussed sexually transmitted diseases, avoidance of unintended pregnancy and contraceptive alternatives.  - Stressed the importance of regular exercise - Injury prevention: Discussed safety belts, safety helmets, smoke detector, smoking near bedding or upholstery.   - Dental health: Discussed importance of regular tooth brushing, flossing, and dental visits.   NEXT PREVENTATIVE PHYSICAL DUE IN 1 YEAR.  No follow-ups on file.  Patient to reach out to office if new, worrisome, or unresolved symptoms arise or if no improvement in patient's condition. Patient verbalized understanding and is agreeable to treatment plan. All questions answered to patient's satisfaction.    Thersia Schuyler Stark, FNP     [1]  Allergies Allergen Reactions   Latex Rash and Other (See Comments)    welps   Adhesive [Tape]     Skin irritation    Grass Extracts [Gramineae Pollens]      Per records from from [facility]   Lactose Intolerance (Gi) Other (See Comments)    Unknown   Naprosyn [Naproxen] Hives   Coq10 [Coenzyme Q10] Other (See Comments)    Cramps    Zocor [Simvastatin] Other (See Comments)    cramps  [2]  Social History Tobacco Use  Smoking Status Never  Smokeless Tobacco Never   "

## 2024-02-03 NOTE — Patient Instructions (Signed)
 Please return for fasting blood work.  For fasting, if your blood work is in the morning please do not eat any food after midnight.  You may have water or black coffee prior to your lab work.  Please take all regularly prescribed medications even if you are fasting.  If your blood work is in the afternoon, please fast for at least 5 to 6 hours.  You may continue to drink water and/or black coffee prior to your lab work.  Please take all scheduled medications even if you are fasting.  Lab open: Monday-Friday 0800-11:30am and 1pm-4:30pm.    Please schedule appt with Cardiology (2nd Floor)

## 2024-02-05 ENCOUNTER — Other Ambulatory Visit (HOSPITAL_BASED_OUTPATIENT_CLINIC_OR_DEPARTMENT_OTHER): Payer: Self-pay

## 2024-02-15 NOTE — Progress Notes (Unsigned)
 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 Bruckner January 2020 for atrial fibrillation.  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  Moved to [place] approximately 10 years ago and was told no evidence of WPW.  Her daughter passed away from heart disease with a kink in one of her arteries at age 92.  Echo 01/2018 with LVEF 35 to 40%, moderate MR, moderate to severe TR, mild PR.  Medical therapy for ICM was instituted. Cardiac cath 01/2018 with minimal nonobstructive CAD.  Echo 05/24/2018 with EF 55 to 60%, normal diastolic function, normal RV, mild to moderate MR.  Cardiac monitor 01/2019 revealed 100% A-fib burden.  Seen 06/01/2022 with NYHA II symptoms.  Entresto , metoprolol , rosuvastatin  were continued.  Seen by Dr. Kassie of pulmonology 04/19/2023 with PFT with no obstructive defect, however air trapping and hyperinflation were seen.  CT chest 05/06/2023 with no pulmonary parenchymal findings, aortic atherosclerosis, coronary artery calcification, enlarged right and left pulmonary arteries suggestive of pulmonary arterial hypertension.  Last cardiology clinic visit was 05/09/2023 with Jennifer Finder, NP.  She noted stress with her husband's recent hospitalization and undergoing workup for valve replacement.  She recently returned to water aerobics and was tolerating well.  Exertional dyspnea improved on increased dose of Breo inhaler.  Echo was discussed but she deferred at that time.  BP was well-controlled.  She was due for  lipid testing and plan was to add Zetia  if LDL still not at goal of less than 70.  No evidence of volume overload.  History of Present Illness  Discussed the use of AI scribe software for clinical note transcription with the patient, who gave verbal consent to proceed.  History of Present Illness Jennifer Mcclain is a very pleasant 79 year old female who is here today for follow-up of atrial fibrillation. She reports a significant nosebleed recently that she was able to stop with about 10 minutes of pressure on the nasal septum. She has since stopped Flonase and uses Vaseline for nasal moisture. Has noted increased bilateral ankle swelling since early December, with more persistent edema in the right ankle. Edema has decreased slightly over the past few weeks.  She has not been as active during the recent snow and icy weather. She went to water aerobics at [employer] today. She notes her heart rate is usually in the 80s at night and feels this is high. She drinks 1 to 1.5 cups of caffeine daily and mainly drinks herbal tea, preferring this over the taste of water. She takes rosuvastatin  four times a week for cholesterol due to prior discomfort with daily dosing. She denies chest pain, shortness of breath, orthopnea, PND, presyncope or syncope. No additional bleeding concerns aside from nosebleed.    ROS: See HPI  Studies Reviewed      No results found for: LIPOA  Risk Assessment/Calculations  CHA2DS2-VASc Score = 6   This indicates a 9.7% annual risk of stroke. The patient's score is based upon: CHF History: 1 HTN History: 1 Diabetes History: 0 Stroke History: 0 Vascular Disease History: 1  Age Score: 2 Gender Score: 1            Physical Exam VS:  BP 124/70   Pulse 93   Ht 5' 2 (1.575 m)   Wt 202 lb (91.6 kg)   SpO2 95%   BMI 36.95 kg/m    Wt Readings from Last 3 Encounters:  02/16/24 202 lb (91.6 kg)  02/03/24 198 lb 14.4 oz (90.2 kg)  10/24/23 197 lb 14.4 oz  (89.8 kg)    GEN: Well nourished, well developed in no acute distress NECK: No JVD; No carotid bruits CARDIAC: Irregular RR, no murmurs, rubs, gallops RESPIRATORY:  Clear to auscultation without rales, wheezing or rhonchi  ABDOMEN: Soft, non-tender, non-distended EXTREMITIES:  Bilateral mild LE edema; No deformity   Assessment & Plan Lower extremity edema HFreF with recovered EF Persistent lower extremity edema is more pronounced in the right ankle, likely due to venous insufficiency and history of atrial fibrillation.  History of HFrEF with EF 35 to 40% on echo 01/2018, with normalization of EF on echo 05/2018 on GDMT.  Currently, she has no dyspnea, orthopnea, or PND.  Bilateral LE edema but no additional signs of volume overload on exam.  She is able to complete activities without significant dyspnea. Has venous u/s upcoming, ordered by PCP.  BP is mildly elevated, see plan below. - Complete workup for venous insufficiency - Consider repeat echo for evaluation of heart function if symptoms  - Elevate legs for 20 minutes, 2-3 times daily, and continue compression  - CMET today - Continue GDMT including metoprolol  and Entresto   Longstanding persistent atrial fibrillation Chronic anticoagulation Nosebleed Atrial fibrillation is well-controlled with metoprolol , though heart rate is slightly elevated, possibly due to inadequate hydration. She is asymptomatic. No symptoms of heart failure or significant dyspnea. Recent nosebleed that resolved after 10 minutes of direct pressure.  We discussed consideration of seeing ENT if this persists. - Continue metoprolol  for rate control - Continue Xarelto  20 mg daily which is appropriate dose for stroke prevention for CHA2DS2-VASc score of 6  Hyperlipidemia LDL goal < 70 Nonobstructive CAD History of minimal nonobstructive CAD on cath in 2020.  Goal LDL is 70 or lower.  Lipid panel 08/03/2023 with total cholesterol 209, HDL 100, LDL 99, and triglycerides  58.  She tolerates rosuvastatin  5 mg 4 days/week. Did not feel well on higher dose when initially started.  Favor trying ezetimibe  over increasing statin.   - Start ezetimibe  10 mg daily  -Continue rosuvastatin  5 mg  4 days per week - Return for repeat lipid panel in 3 months   Hypertension   BP initially elevated at 144/84 but improved on my recheck to 124/70.  She reports home readings have generally been 140/80 or higher.  She is getting CMET today ordered by PCP. - Monitor blood pressure at home more frequently and aim to keep it below 140/80 mmHg -  Consider additional anti-hypertensive therapy if BP remains elevated above goal - Continue current antihypertensive therapy including metoprolol , Entresto  - Consider addition of spironolactone if BP remains above goal         Dispo: 6 months with Dr. Lonni or APP  Signed, Rosaline Bane, NP-C "

## 2024-02-16 ENCOUNTER — Other Ambulatory Visit (HOSPITAL_BASED_OUTPATIENT_CLINIC_OR_DEPARTMENT_OTHER): Payer: Self-pay | Admitting: *Deleted

## 2024-02-16 ENCOUNTER — Other Ambulatory Visit: Payer: Self-pay

## 2024-02-16 ENCOUNTER — Encounter (HOSPITAL_BASED_OUTPATIENT_CLINIC_OR_DEPARTMENT_OTHER): Payer: Self-pay

## 2024-02-16 ENCOUNTER — Encounter (HOSPITAL_BASED_OUTPATIENT_CLINIC_OR_DEPARTMENT_OTHER): Payer: Self-pay | Admitting: Nurse Practitioner

## 2024-02-16 ENCOUNTER — Other Ambulatory Visit (HOSPITAL_BASED_OUTPATIENT_CLINIC_OR_DEPARTMENT_OTHER): Payer: Self-pay

## 2024-02-16 ENCOUNTER — Ambulatory Visit (HOSPITAL_BASED_OUTPATIENT_CLINIC_OR_DEPARTMENT_OTHER): Admitting: Nurse Practitioner

## 2024-02-16 VITALS — BP 124/70 | HR 93 | Ht 62.0 in | Wt 202.0 lb

## 2024-02-16 DIAGNOSIS — I4821 Permanent atrial fibrillation: Secondary | ICD-10-CM | POA: Diagnosis not present

## 2024-02-16 DIAGNOSIS — I2510 Atherosclerotic heart disease of native coronary artery without angina pectoris: Secondary | ICD-10-CM

## 2024-02-16 DIAGNOSIS — R040 Epistaxis: Secondary | ICD-10-CM | POA: Diagnosis not present

## 2024-02-16 DIAGNOSIS — I10 Essential (primary) hypertension: Secondary | ICD-10-CM | POA: Diagnosis not present

## 2024-02-16 DIAGNOSIS — I428 Other cardiomyopathies: Secondary | ICD-10-CM

## 2024-02-16 DIAGNOSIS — E785 Hyperlipidemia, unspecified: Secondary | ICD-10-CM

## 2024-02-16 DIAGNOSIS — R600 Localized edema: Secondary | ICD-10-CM

## 2024-02-16 DIAGNOSIS — D6859 Other primary thrombophilia: Secondary | ICD-10-CM | POA: Diagnosis not present

## 2024-02-16 MED ORDER — EZETIMIBE 10 MG PO TABS
10.0000 mg | ORAL_TABLET | Freq: Every day | ORAL | 3 refills | Status: AC
  Filled 2024-02-16 (×2): qty 90, 90d supply, fill #0

## 2024-02-16 NOTE — Patient Instructions (Addendum)
 Medication Instructions:   START Zetia  one (1) tablet by mouth ( 10 mg) daily.   *If you need a refill on your cardiac medications before your next appointment, please call your pharmacy*  Lab Work:  Return for fasting lipid panel in 3 months for surveillance   If you have labs (blood work) drawn today and your tests are completely normal, you will receive your results only by: MyChart Message (if you have MyChart) OR A paper copy in the mail If you have any lab test that is abnormal or we need to change your treatment, we will call you to review the results.  Testing/Procedures:  None ordered.  Follow-Up: At [facility], you and your health needs are our priority.  As part of our continuing mission to provide you with exceptional heart care, our providers are all part of one team.  This team includes your primary Cardiologist (physician) and Advanced Practice Providers or APPs (Physician Assistants and Nurse Practitioners) who all work together to provide you with the care you need, when you need it.  Your next appointment:   6 month(s)  Provider:   Shelda Bruckner, MD, Rosaline Bane, NP, or Reche Finder, NP    We recommend signing up for the patient portal called MyChart.  Sign up information is provided on this After Visit Summary.  MyChart is used to connect with patients for Virtual Visits (Telemedicine).  Patients are able to view lab/test results, encounter notes, upcoming appointments, etc.  Non-urgent messages can be sent to your provider as well.   To learn more about what you can do with MyChart, go to [email or web address].   Other Instructions  Your physician wants you to follow-up in: 6 months.  You will receive a reminder letter in the mail two months in advance. If you don't receive a letter, please call our office to schedule the follow-up appointment.  HOW TO TAKE YOUR BLOOD PRESSURE  Rest 5 minutes before taking your blood  pressure. Don't  smoke or drink caffeinated beverages for at least 30 minutes before. Take your blood pressure before (not after) you eat. Sit comfortably with your back supported and both feet on the floor ( don't cross your legs). Elevate your arm to heart level on a table or a desk. Use the proper sized cuff.  It should fit smoothly and snugly around your bare upper arm.  There should be  Enough room to slip a fingertip under the cuff.  The bottom edge of the cuff should be 1 inch above the crease Of the elbow. Please monitor your blood pressure once daily 2 hours after your am medication.  Your goal blood pressure Is bleow 130 (systolic) top number or over 80 ( diastolic) bottom number.    ----Avoid cold medicines with D or DM at the end of them----

## 2024-02-17 ENCOUNTER — Ambulatory Visit (HOSPITAL_BASED_OUTPATIENT_CLINIC_OR_DEPARTMENT_OTHER): Payer: Self-pay | Admitting: Family Medicine

## 2024-02-17 ENCOUNTER — Other Ambulatory Visit: Payer: Self-pay

## 2024-02-17 LAB — CBC WITH DIFFERENTIAL/PLATELET
Basophils Absolute: 0 10*3/uL (ref 0.0–0.2)
Basos: 1 %
EOS (ABSOLUTE): 0 10*3/uL (ref 0.0–0.4)
Eos: 0 %
Hematocrit: 40.2 % (ref 34.0–46.6)
Hemoglobin: 12.7 g/dL (ref 11.1–15.9)
Immature Grans (Abs): 0 10*3/uL (ref 0.0–0.1)
Immature Granulocytes: 0 %
Lymphocytes Absolute: 1.1 10*3/uL (ref 0.7–3.1)
Lymphs: 17 %
MCH: 31.8 pg (ref 26.6–33.0)
MCHC: 31.6 g/dL (ref 31.5–35.7)
MCV: 101 fL — ABNORMAL HIGH (ref 79–97)
Monocytes Absolute: 0.5 10*3/uL (ref 0.1–0.9)
Monocytes: 7 %
Neutrophils Absolute: 4.7 10*3/uL (ref 1.4–7.0)
Neutrophils: 75 %
Platelets: 191 10*3/uL (ref 150–450)
RBC: 3.99 x10E6/uL (ref 3.77–5.28)
RDW: 12.2 % (ref 11.7–15.4)
WBC: 6.4 10*3/uL (ref 3.4–10.8)

## 2024-02-17 LAB — HEMOGLOBIN A1C
Est. average glucose Bld gHb Est-mCnc: 108 mg/dL
Hgb A1c MFr Bld: 5.4 % (ref 4.8–5.6)

## 2024-02-17 LAB — COMPREHENSIVE METABOLIC PANEL WITH GFR
ALT: 10 [IU]/L (ref 0–32)
AST: 18 [IU]/L (ref 0–40)
Albumin: 4.1 g/dL (ref 3.8–4.8)
Alkaline Phosphatase: 78 [IU]/L (ref 49–135)
BUN/Creatinine Ratio: 29 — ABNORMAL HIGH (ref 12–28)
BUN: 28 mg/dL — ABNORMAL HIGH (ref 8–27)
Bilirubin Total: 0.5 mg/dL (ref 0.0–1.2)
CO2: 23 mmol/L (ref 20–29)
Calcium: 10 mg/dL (ref 8.7–10.3)
Chloride: 101 mmol/L (ref 96–106)
Creatinine, Ser: 0.97 mg/dL (ref 0.57–1.00)
Globulin, Total: 2.4 g/dL (ref 1.5–4.5)
Glucose: 100 mg/dL — ABNORMAL HIGH (ref 70–99)
Potassium: 5 mmol/L (ref 3.5–5.2)
Sodium: 139 mmol/L (ref 134–144)
Total Protein: 6.5 g/dL (ref 6.0–8.5)
eGFR: 61 mL/min/{1.73_m2}

## 2024-02-17 LAB — TSH: TSH: 2.2 u[IU]/mL (ref 0.450–4.500)

## 2024-02-17 LAB — LIPID PANEL
Chol/HDL Ratio: 2 ratio (ref 0.0–4.4)
Cholesterol, Total: 210 mg/dL — ABNORMAL HIGH (ref 100–199)
HDL: 104 mg/dL
LDL Chol Calc (NIH): 95 mg/dL (ref 0–99)
Triglycerides: 62 mg/dL (ref 0–149)
VLDL Cholesterol Cal: 11 mg/dL (ref 5–40)

## 2024-02-17 NOTE — Progress Notes (Signed)
 Hi Jennifer Mcclain Your blood counts are stable.  Please continue to stay well-hydrated.  Your kidney function has improved from 6 months prior and your glucose remains stable.  Your electrolytes and liver function are within normal limits.  Your total cholesterol remains slightly elevated at 210 and LDL is not currently at goal.  I know you discussed this with cardiology yesterday and they have added on Zetia  for you with a recheck of your cholesterol in 3 months.  Please continue to follow their directions.  Thyroid  function is within normal limits and A1c is negative for signs of prediabetes.  Please continue a good balanced diet, regular exercise and good hydration.

## 2024-03-27 ENCOUNTER — Ambulatory Visit (HOSPITAL_BASED_OUTPATIENT_CLINIC_OR_DEPARTMENT_OTHER)

## 2024-04-26 ENCOUNTER — Ambulatory Visit (HOSPITAL_BASED_OUTPATIENT_CLINIC_OR_DEPARTMENT_OTHER): Admitting: Cardiology

## 2024-05-16 ENCOUNTER — Encounter

## 2024-05-16 ENCOUNTER — Ambulatory Visit (HOSPITAL_COMMUNITY)

## 2024-06-06 ENCOUNTER — Ambulatory Visit: Admitting: Dermatology

## 2024-07-23 ENCOUNTER — Ambulatory Visit: Admitting: Dermatology

## 2024-08-01 ENCOUNTER — Ambulatory Visit (HOSPITAL_BASED_OUTPATIENT_CLINIC_OR_DEPARTMENT_OTHER): Admitting: Family Medicine
